# Patient Record
Sex: Male | Born: 1955 | Race: White | Hispanic: No | Marital: Married | Smoking: Never smoker
Health system: Southern US, Community
[De-identification: ages and names within clinical notes are randomized; demographics above are authoritative.]

## PROBLEM LIST (undated history)

## (undated) DIAGNOSIS — E785 Hyperlipidemia, unspecified: Secondary | ICD-10-CM

## (undated) DIAGNOSIS — R066 Hiccough: Secondary | ICD-10-CM

## (undated) DIAGNOSIS — M1990 Unspecified osteoarthritis, unspecified site: Secondary | ICD-10-CM

## (undated) DIAGNOSIS — F32A Depression, unspecified: Secondary | ICD-10-CM

## (undated) DIAGNOSIS — G4730 Sleep apnea, unspecified: Secondary | ICD-10-CM

## (undated) DIAGNOSIS — J189 Pneumonia, unspecified organism: Secondary | ICD-10-CM

## (undated) DIAGNOSIS — I10 Essential (primary) hypertension: Secondary | ICD-10-CM

## (undated) DIAGNOSIS — F329 Major depressive disorder, single episode, unspecified: Secondary | ICD-10-CM

## (undated) HISTORY — DX: Essential (primary) hypertension: I10

## (undated) HISTORY — DX: Pneumonia, unspecified organism: J18.9

## (undated) HISTORY — DX: Hyperlipidemia, unspecified: E78.5

## (undated) HISTORY — PX: CATARACT EXTRACTION: SUR2

## (undated) HISTORY — DX: Depression, unspecified: F32.A

## (undated) HISTORY — DX: Major depressive disorder, single episode, unspecified: F32.9

## (undated) HISTORY — DX: Hiccough: R06.6

## (undated) HISTORY — PX: OTHER SURGICAL HISTORY: SHX169

## (undated) HISTORY — PX: COLONOSCOPY: SHX174

## (undated) HISTORY — DX: Unspecified osteoarthritis, unspecified site: M19.90

## (undated) HISTORY — DX: Sleep apnea, unspecified: G47.30

## (undated) HISTORY — PX: MELANOMA EXCISION: SHX5266

---

## 2001-08-16 ENCOUNTER — Encounter: Payer: Self-pay | Admitting: Pulmonary Disease

## 2001-09-18 ENCOUNTER — Encounter: Payer: Self-pay | Admitting: Pulmonary Disease

## 2005-08-19 ENCOUNTER — Ambulatory Visit: Payer: Self-pay | Admitting: Family Medicine

## 2005-08-30 ENCOUNTER — Ambulatory Visit: Payer: Self-pay | Admitting: Family Medicine

## 2005-10-28 ENCOUNTER — Ambulatory Visit: Payer: Self-pay | Admitting: Family Medicine

## 2006-08-28 ENCOUNTER — Ambulatory Visit: Payer: Self-pay | Admitting: Family Medicine

## 2006-09-11 ENCOUNTER — Encounter: Admission: RE | Admit: 2006-09-11 | Discharge: 2006-09-11 | Payer: Self-pay | Admitting: Surgery

## 2006-10-13 ENCOUNTER — Ambulatory Visit: Payer: Self-pay | Admitting: Family Medicine

## 2006-10-13 LAB — CONVERTED CEMR LAB
Albumin: 4 g/dL (ref 3.5–5.2)
Alkaline Phosphatase: 77 units/L (ref 39–117)
Basophils Relative: 0.7 % (ref 0.0–1.0)
Eosinophils Relative: 3.1 % (ref 0.0–5.0)
Glucose, Bld: 85 mg/dL (ref 70–99)
HCT: 47.8 % (ref 39.0–52.0)
LDL Cholesterol: 105 mg/dL — ABNORMAL HIGH (ref 0–99)
MCHC: 35 g/dL (ref 30.0–36.0)
MCV: 91.4 fL (ref 78.0–100.0)
Monocytes Relative: 8.3 % (ref 3.0–11.0)
Platelets: 253 10*3/uL (ref 150–400)
Potassium: 4 meq/L (ref 3.5–5.1)
Total Bilirubin: 0.9 mg/dL (ref 0.3–1.2)
Total CHOL/HDL Ratio: 3.6
Triglycerides: 93 mg/dL (ref 0–149)
WBC: 7.7 10*3/uL (ref 4.5–10.5)

## 2006-10-20 ENCOUNTER — Ambulatory Visit: Payer: Self-pay | Admitting: Family Medicine

## 2006-11-07 ENCOUNTER — Ambulatory Visit: Payer: Self-pay | Admitting: Licensed Clinical Social Worker

## 2006-11-14 ENCOUNTER — Ambulatory Visit: Payer: Self-pay | Admitting: Licensed Clinical Social Worker

## 2006-11-22 ENCOUNTER — Ambulatory Visit: Payer: Self-pay | Admitting: Licensed Clinical Social Worker

## 2006-11-28 ENCOUNTER — Ambulatory Visit: Payer: Self-pay | Admitting: Licensed Clinical Social Worker

## 2007-02-12 ENCOUNTER — Emergency Department (HOSPITAL_COMMUNITY): Admission: EM | Admit: 2007-02-12 | Discharge: 2007-02-12 | Payer: Self-pay | Admitting: Emergency Medicine

## 2007-05-11 DIAGNOSIS — G4733 Obstructive sleep apnea (adult) (pediatric): Secondary | ICD-10-CM

## 2007-05-11 DIAGNOSIS — E785 Hyperlipidemia, unspecified: Secondary | ICD-10-CM

## 2007-05-29 ENCOUNTER — Ambulatory Visit: Payer: Self-pay | Admitting: Family Medicine

## 2007-05-29 DIAGNOSIS — F329 Major depressive disorder, single episode, unspecified: Secondary | ICD-10-CM

## 2007-07-09 ENCOUNTER — Ambulatory Visit: Payer: Self-pay | Admitting: Pulmonary Disease

## 2007-08-07 ENCOUNTER — Encounter: Payer: Self-pay | Admitting: Pulmonary Disease

## 2007-08-17 ENCOUNTER — Ambulatory Visit: Payer: Self-pay | Admitting: Pulmonary Disease

## 2007-08-21 ENCOUNTER — Encounter: Payer: Self-pay | Admitting: Pulmonary Disease

## 2007-09-08 IMAGING — CT CT PELVIS W/ CM
1 of 5 series · 14 of 36 positions shown, 19 images · IV contrast (READICAT/WATER & [ID] OMNI 300)
Comparison: None.

ABDOMEN CT WITH CONTRAST:

CLINICAL DATA: Left abdominal pain. Lymphadenopathy.
TECHNIQUE: Multidetector CT imaging of the abdomen and pelvis was performed
following the standard protocol during bolus administration of intravenous
contrast.

Contrast:  125 cc Omnipaque 300

[Series 3: routine abdomen · axial · 0.70mm/px · z∈[-457,-72]mm · 14 of 87 slices shown, 19 images]
[im 5/87  soft-tissue]
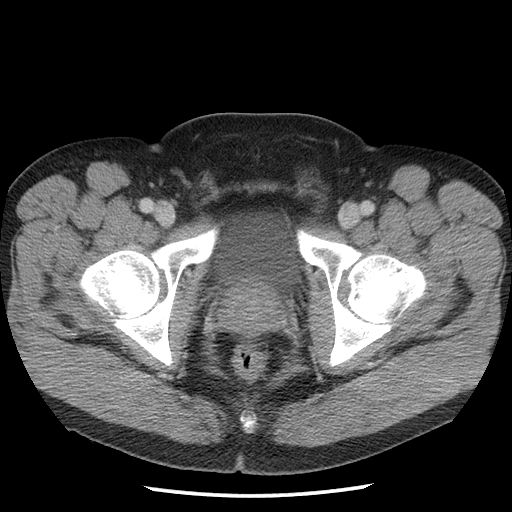
[im 5/87  bone]
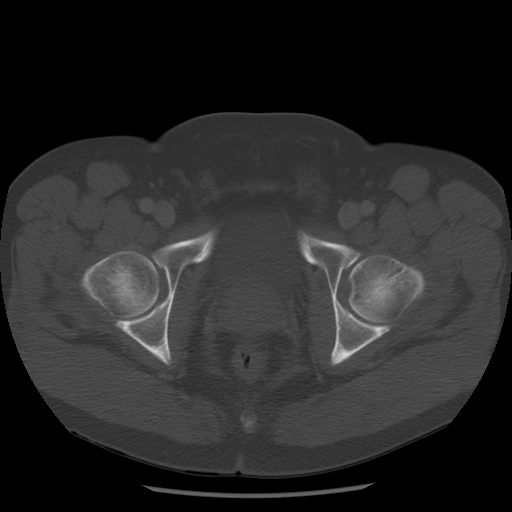
[im 10/87  soft-tissue]
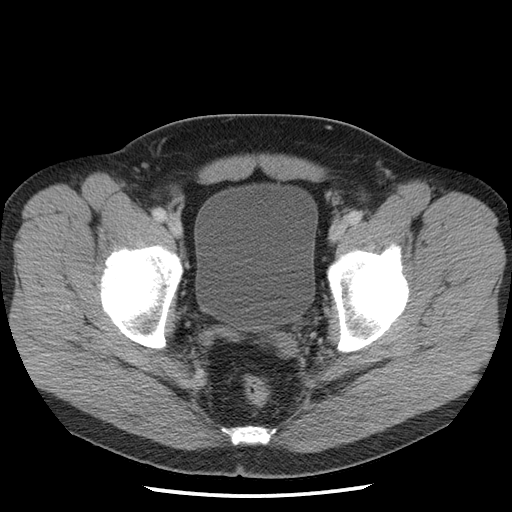
[im 20/87  soft-tissue]
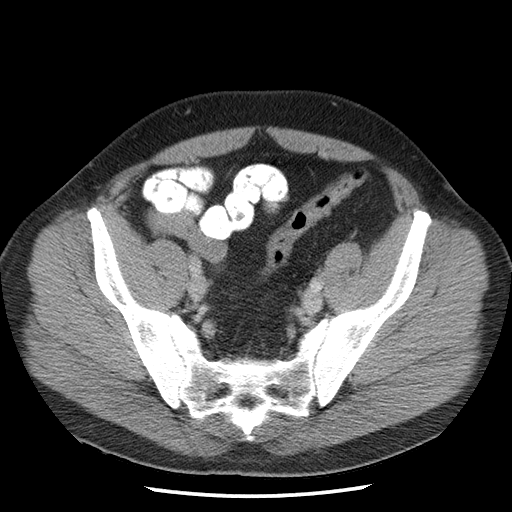
[im 24/87  soft-tissue]
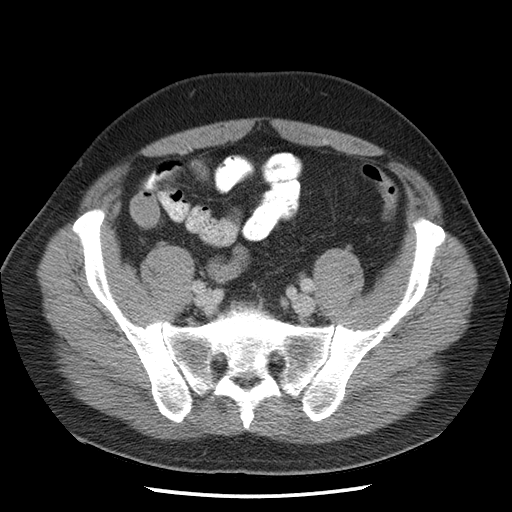
[im 29/87  soft-tissue]
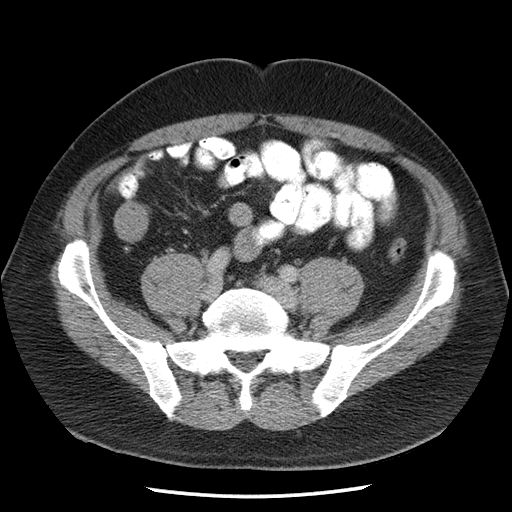
[im 39/87  soft-tissue]
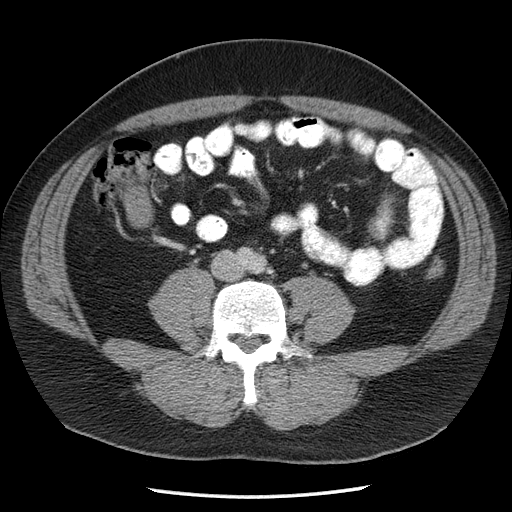
[im 44/87  soft-tissue]
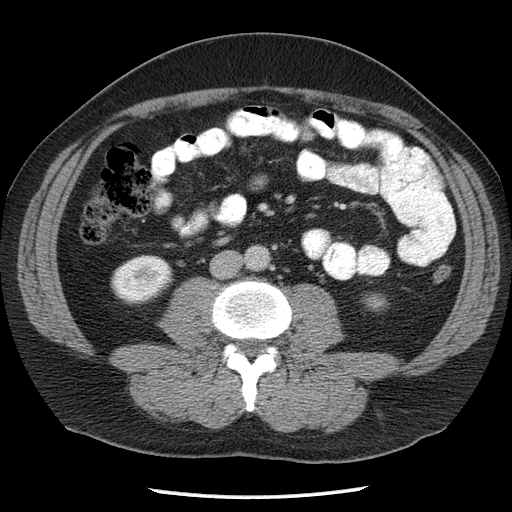
[im 48/87  soft-tissue]
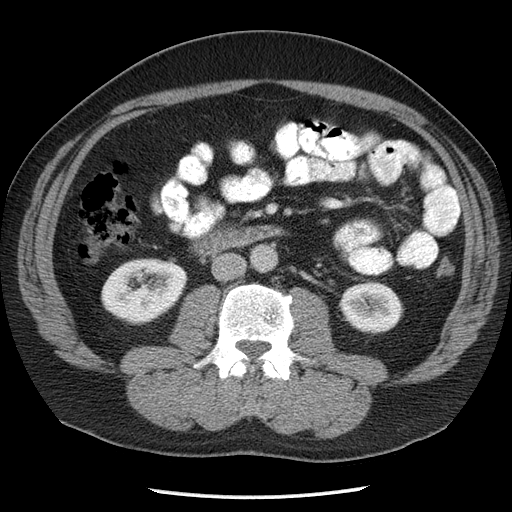
[im 58/87  soft-tissue]
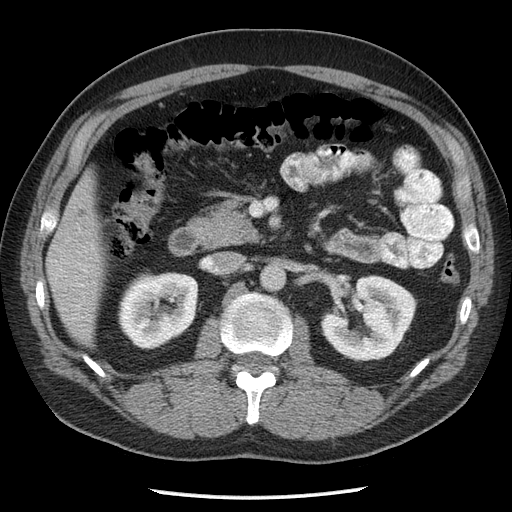
[im 58/87  bone]
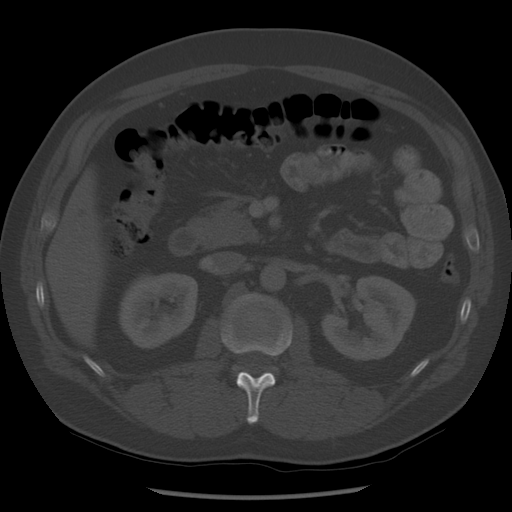
[im 63/87  soft-tissue]
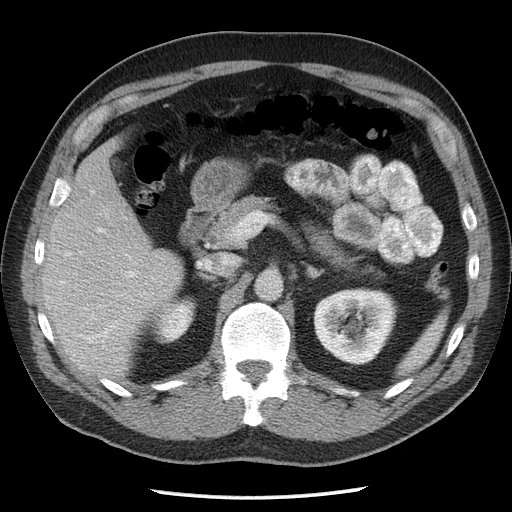
[im 67/87  soft-tissue]
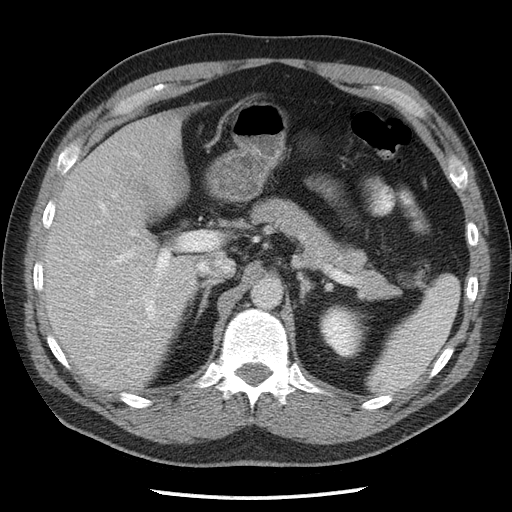
[im 67/87  lung]
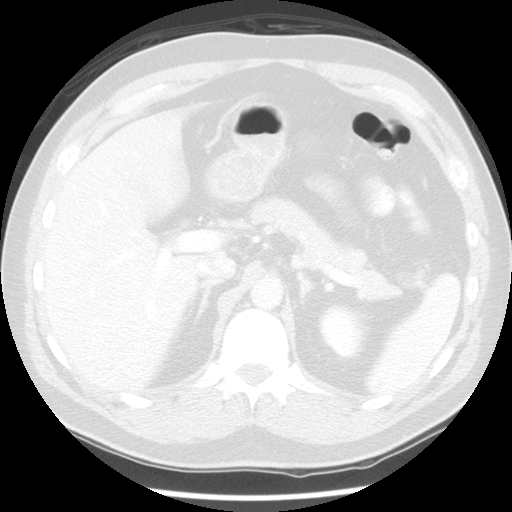
[im 72/87  lung]
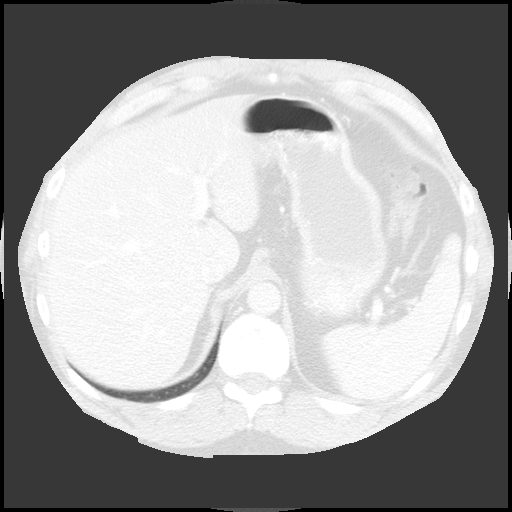
[im 77/87  soft-tissue]
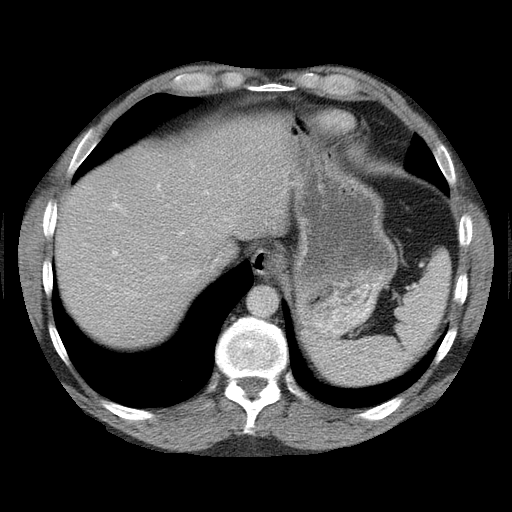
[im 77/87  lung]
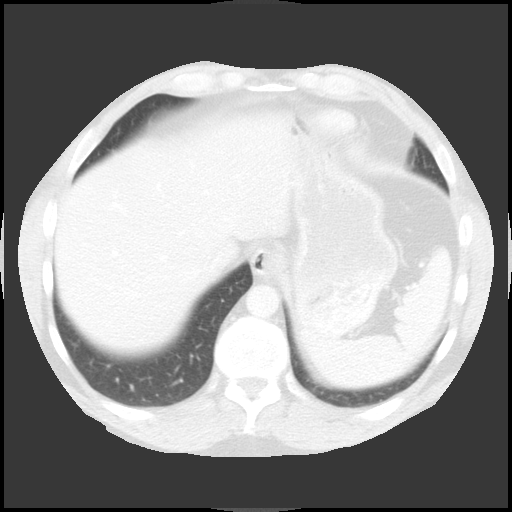
[im 82/87  soft-tissue]
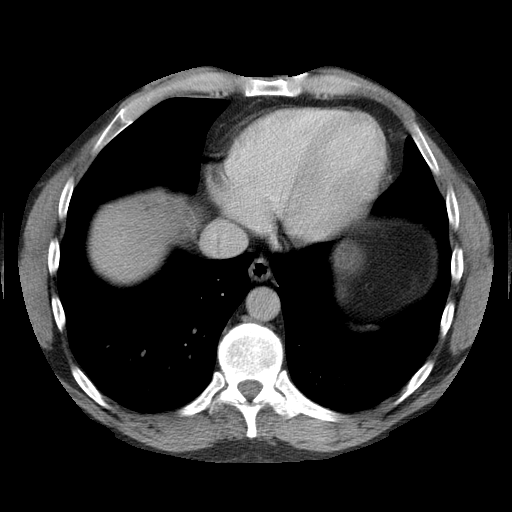
[im 82/87  lung]
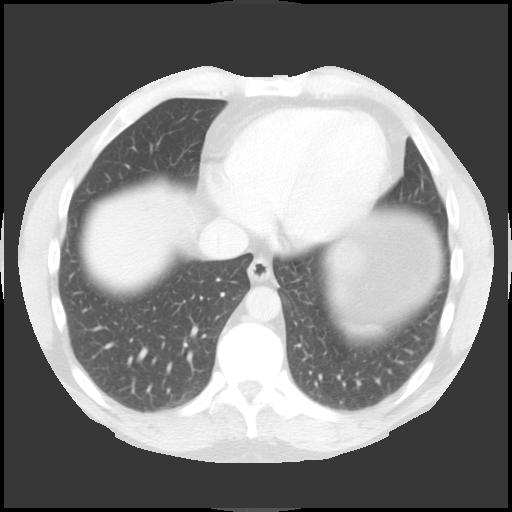

[14 of 36 positions shown; findings below may reference images not displayed]

FINDINGS: A tiny low density lesion in the inferior right liver is too small to
characterize but statistically represents a benign lesion, probably a cyst. The
spleen, stomach, duodenum, pancreas, adrenal glands, and kidneys are
unremarkable. Gallbladder is not distended. There is no retroperitoneal,
hepatoduodenal ligament, or mesenteric lymphadenopathy. No abdominal aortic
aneurysm.
IMPRESSION: Unremarkable CT examination of the abdomen.

PELVIS CT WITH CONTRAST:
FINDINGS: No lymphadenopathy in the anatomic pelvis. No intraperitoneal free
fluid. Bladder is mildly distended. Terminal ileum as normal features. There is
a tiny appendicolith in the appendix which is otherwise normal in appearance.
IMPRESSION: Unremarkable exam of the pelvis. Specifically, there is no evidence for
lymphadenopathy.

## 2007-12-06 ENCOUNTER — Ambulatory Visit: Payer: Self-pay | Admitting: Family Medicine

## 2007-12-06 DIAGNOSIS — M6580 Other synovitis and tenosynovitis, unspecified site: Secondary | ICD-10-CM

## 2007-12-06 LAB — CONVERTED CEMR LAB
ALT: 35 units/L (ref 0–53)
AST: 32 units/L (ref 0–37)
Alkaline Phosphatase: 74 units/L (ref 39–117)
BUN: 17 mg/dL (ref 6–23)
Basophils Absolute: 0 10*3/uL (ref 0.0–0.1)
Basophils Relative: 0.1 % (ref 0.0–1.0)
Bilirubin Urine: NEGATIVE
Blood in Urine, dipstick: NEGATIVE
CO2: 32 meq/L (ref 19–32)
Calcium: 9.5 mg/dL (ref 8.4–10.5)
Eosinophils Absolute: 0.2 10*3/uL (ref 0.0–0.7)
Eosinophils Relative: 1.9 % (ref 0.0–5.0)
Glucose, Bld: 95 mg/dL (ref 70–99)
HCT: 47.4 % (ref 39.0–52.0)
HDL: 42.2 mg/dL (ref >39.0)
MCV: 93.3 fL (ref 78.0–100.0)
Monocytes Relative: 7.2 % (ref 3.0–12.0)
Neutrophils Relative %: 70.3 % (ref 43.0–77.0)
Nitrite: NEGATIVE
PSA: 1.49 ng/mL (ref 0.10–4.00)
Protein, U semiquant: NEGATIVE
RDW: 12.3 % (ref 11.5–14.6)
Specific Gravity, Urine: 1.02
Urobilinogen, UA: 0.2
VLDL: 42 mg/dL — ABNORMAL HIGH (ref 0–40)
WBC Urine, dipstick: NEGATIVE
WBC: 11.4 10*3/uL — ABNORMAL HIGH (ref 4.5–10.5)

## 2008-01-14 ENCOUNTER — Ambulatory Visit: Payer: Self-pay | Admitting: Family Medicine

## 2009-01-05 ENCOUNTER — Ambulatory Visit: Payer: Self-pay | Admitting: Family Medicine

## 2009-01-05 LAB — CONVERTED CEMR LAB
ALT: 37 units/L (ref 0–53)
Albumin: 4 g/dL (ref 3.5–5.2)
BUN: 13 mg/dL (ref 6–23)
Basophils Relative: 0.1 % (ref 0.0–3.0)
Bilirubin, Direct: 0 mg/dL (ref 0.0–0.3)
Blood in Urine, dipstick: NEGATIVE
Chloride: 103 meq/L (ref 96–112)
Eosinophils Absolute: 0.2 10*3/uL (ref 0.0–0.7)
GFR calc non Af Amer: 94.63 mL/min (ref >60)
HCT: 45.7 % (ref 39.0–52.0)
Hemoglobin: 16 g/dL (ref 13.0–17.0)
Ketones, urine, test strip: NEGATIVE
LDL Cholesterol: 85 mg/dL (ref 0–99)
Lymphocytes Relative: 27 % (ref 12.0–46.0)
Lymphs Abs: 2.5 10*3/uL (ref 0.7–4.0)
MCHC: 35.1 g/dL (ref 30.0–36.0)
MCV: 95.1 fL (ref 78.0–100.0)
Monocytes Relative: 9.1 % (ref 3.0–12.0)
Neutro Abs: 5.6 10*3/uL (ref 1.4–7.7)
Neutrophils Relative %: 61.4 % (ref 43.0–77.0)
Nitrite: NEGATIVE
RBC: 4.8 M/uL (ref 4.22–5.81)
Specific Gravity, Urine: 1.02
TSH: 1.09 microintl units/mL (ref 0.35–5.50)
Total CHOL/HDL Ratio: 4
VLDL: 35.2 mg/dL (ref 0.0–40.0)

## 2009-01-14 ENCOUNTER — Encounter: Payer: Self-pay | Admitting: Family Medicine

## 2009-01-15 ENCOUNTER — Ambulatory Visit: Payer: Self-pay | Admitting: Family Medicine

## 2009-01-15 DIAGNOSIS — R269 Unspecified abnormalities of gait and mobility: Secondary | ICD-10-CM

## 2009-01-26 ENCOUNTER — Ambulatory Visit: Payer: Self-pay | Admitting: Gastroenterology

## 2009-02-09 ENCOUNTER — Ambulatory Visit: Payer: Self-pay | Admitting: Gastroenterology

## 2009-02-09 LAB — HM COLONOSCOPY

## 2009-03-09 ENCOUNTER — Telehealth: Payer: Self-pay | Admitting: Family Medicine

## 2009-11-29 ENCOUNTER — Encounter: Payer: Self-pay | Admitting: Pulmonary Disease

## 2009-11-30 ENCOUNTER — Ambulatory Visit: Payer: Self-pay | Admitting: Pulmonary Disease

## 2010-03-25 ENCOUNTER — Ambulatory Visit: Payer: Self-pay | Admitting: Family Medicine

## 2010-03-25 LAB — CONVERTED CEMR LAB
ALT: 44 units/L (ref 0–53)
AST: 31 units/L (ref 0–37)
Albumin: 4.2 g/dL (ref 3.5–5.2)
Bilirubin Urine: NEGATIVE
Bilirubin, Direct: 0.1 mg/dL (ref 0.0–0.3)
Blood in Urine, dipstick: NEGATIVE
Calcium: 9.4 mg/dL (ref 8.4–10.5)
Chloride: 103 meq/L (ref 96–112)
Cholesterol: 208 mg/dL — ABNORMAL HIGH (ref 0–200)
Direct LDL: 138.4 mg/dL
GFR calc non Af Amer: 84.37 mL/min (ref >60)
Glucose, Bld: 81 mg/dL (ref 70–99)
HDL: 49.4 mg/dL (ref >39.00)
Lymphocytes Relative: 28.8 % (ref 12.0–46.0)
MCHC: 35.5 g/dL (ref 30.0–36.0)
Monocytes Relative: 7.2 % (ref 3.0–12.0)
Neutro Abs: 4.8 10*3/uL (ref 1.4–7.7)
Nitrite: NEGATIVE
PSA: 1.39 ng/mL (ref 0.10–4.00)
Platelets: 236 10*3/uL (ref 150.0–400.0)
RBC: 4.81 M/uL (ref 4.22–5.81)
Total Bilirubin: 0.6 mg/dL (ref 0.3–1.2)
Total Protein: 6.6 g/dL (ref 6.0–8.3)
Urobilinogen, UA: 0.2
WBC: 7.9 10*3/uL (ref 4.5–10.5)

## 2010-04-09 ENCOUNTER — Ambulatory Visit: Payer: Self-pay | Admitting: Family Medicine

## 2010-04-20 ENCOUNTER — Ambulatory Visit: Payer: Self-pay | Admitting: Family Medicine

## 2010-04-23 ENCOUNTER — Ambulatory Visit: Payer: Self-pay | Admitting: Family Medicine

## 2010-04-23 DIAGNOSIS — M549 Dorsalgia, unspecified: Secondary | ICD-10-CM | POA: Insufficient documentation

## 2010-04-29 ENCOUNTER — Ambulatory Visit: Payer: Self-pay | Admitting: Family Medicine

## 2010-05-10 ENCOUNTER — Telehealth: Payer: Self-pay | Admitting: Family Medicine

## 2010-05-10 DIAGNOSIS — Z85828 Personal history of other malignant neoplasm of skin: Secondary | ICD-10-CM

## 2010-09-14 NOTE — Assessment & Plan Note (Signed)
Summary: lesion removal/njr pt rsc/njr   Allergies: No Known Drug Allergies   Complete Medication List: 1)  Simvastatin 40 Mg Tabs (Simvastatin) .... Take 1 tablet by mouth at bedtime 2)  Prozac 10 Mg Caps (Fluoxetine hcl) .... Once daily 3)  Lamictal 200 Mg Tabs (Lamotrigine) .... Q hs  Other Orders: No Charge Patient Arrived (NCPA0) (NCPA0)

## 2010-09-14 NOTE — Assessment & Plan Note (Signed)
Summary: back pain/njr   Vital Signs:  Patient profile:   55 year old male Weight:      232 pounds Temp:     98.1 degrees F oral BP sitting:   130 / 82  (right arm) Cuff size:   regular CC: back pain, src Is Patient Diabetic? No   Primary Care Vincent Wilkerson:  Vincent Wilkerson  CC:  back pain and src.  History of Present Illness: Vincent Wilkerson is a 55 year old, married male, nonsmoker, who comes in today for evaluation of low back pain.  Three months ago.  He was using a chain saw was doing a lot of bending and developed severe left lumbar back pain.  At that time.  He treated symptomatically at home, however, it took 3 weeks for the pain go away.  Since that, time.  He's been having intermittent spells low back strain.  This morning around 11 he went to get out of the car twisted it again, developed the sudden onset of severe low back pain.  He describes the pain as sharp, constant, a 7 on a scale of one to 10, and it does not radiate down his legs.  Review of systems negative.  Neurologic review of systems also negative  Preventive Screening-Counseling & Management  Alcohol-Tobacco     Smoking Status: never  Current Medications (verified): 1)  Simvastatin 40 Mg Tabs (Simvastatin) .... Take 1 Tablet By Mouth At Bedtime 2)  Prozac 40 Mg  Caps (Fluoxetine Hcl) .... Once Daily 3)  Lamictal 200 Mg  Tabs (Lamotrigine) .... Q Hs  Allergies (verified): No Known Drug Allergies  Past History:  Past medical, surgical, family and social histories (including risk factors) reviewed, and no changes noted (except as noted below).  Past Medical History: Reviewed history from 08/17/2007 and no changes required. Current Problems:  DEPRESSION (ICD-311) SLEEP APNEA (ICD-780.57) HYPERLIPIDEMIA (ICD-272.4)  Past Surgical History: Reviewed history from 05/11/2007 and no changes required. pneumonia  1962  Family History: Reviewed history from 12/06/2007 and no changes required. father mid-70s has  congestive heart failure, polycythemia, and gallbladder disease mother has a history of smoking, alcoholism, and depression  one brother in good healthone sister died at 42 from alcoholism  Social History: Reviewed history from 12/06/2007 and no changes required. Occupation: Married Never Smoked Alcohol use-no Drug use-no Regular exercise-yes  Review of Systems      See HPI  Physical Exam  General:  Well-developed,well-nourished,in no acute distress; alert,appropriate and cooperative throughout examination Msk:  normal except extremely tight hamstrings.  Patient cannot lift leg beyond 45 degrees Pulses:  R and L carotid,radial,femoral,dorsalis pedis and posterior tibial pulses are full and equal bilaterally Extremities:  No clubbing, cyanosis, edema, or deformity noted with normal full range of motion of all joints.   Neurologic:  No cranial nerve deficits noted. Station and gait are normal. Plantar reflexes are down-going bilaterally. DTRs are symmetrical throughout. Sensory, motor and coordinative functions appear intact.   Problems:  Medical Problems Added: 1)  Dx of Back Pain  (ICD-724.5)  Impression & Recommendations:  Problem # 1:  BACK PAIN (ICD-724.5) Assessment New  His updated medication list for this problem includes:    Vicodin Es 7.5-750 Mg Tabs (Hydrocodone-acetaminophen) .Marland Kitchen... Take 1 tablet by mouth three times a day    Flexeril 10 Mg Tabs (Cyclobenzaprine hcl) .Marland Kitchen... Take 1 tablet by mouth three times a day  Orders: Physical Therapy Referral (PT)  Complete Medication List: 1)  Simvastatin 40 Mg Tabs (Simvastatin) .... Take 1 tablet  by mouth at bedtime 2)  Prozac 40 Mg Caps (fluoxetine Hcl)  .... Once daily 3)  Lamictal 200 Mg Tabs (Lamotrigine) .... Q hs 4)  Vicodin Es 7.5-750 Mg Tabs (Hydrocodone-acetaminophen) .... Take 1 tablet by mouth three times a day 5)  Flexeril 10 Mg Tabs (Cyclobenzaprine hcl) .... Take 1 tablet by mouth three times a  day  Patient Instructions: 1)  stay at bed rest at home today, Saturday, and Sunday.........Marland Kitchen on Sunday begin to walk.......... lie down..........I will put in a request to get started on physical therapy on Monday. 2)  Take a half of Flexeril and Vicodin 3 or 4 times a day. 3)  Motrin 600 mg 3 times a day with food Prescriptions: FLEXERIL 10 MG TABS (CYCLOBENZAPRINE HCL) Take 1 tablet by mouth three times a day  #30 x 1   Entered and Authorized by:   Roderick Pee MD   Signed by:   Roderick Pee MD on 04/23/2010   Method used:   Print then Give to Patient   RxID:   [id number] VICODIN ES 7.5-750 MG TABS (HYDROCODONE-ACETAMINOPHEN) Take 1 tablet by mouth three times a day  #30 x 1   Entered and Authorized by:   Roderick Pee MD   Signed by:   Roderick Pee MD on 04/23/2010   Method used:   Print then Give to Patient   RxID:   [id number]

## 2010-09-14 NOTE — Assessment & Plan Note (Signed)
Summary: cpx/cjr   Vital Signs:  Patient profile:   55 year old male Height:      69.5 inches Weight:      231 pounds Temp:     98.5 degrees F oral BP sitting:   140 / 90  (left arm) Cuff size:   regular  Vitals Entered By: Kern Reap CMA Duncan Dull) (April 09, 2010 2:37 PM) CC: cpx Is Patient Diabetic? No Pain Assessment Patient in pain? no        Primary Care Domonique Brouillard:  todd  CC:  cpx.  History of Present Illness: Vincent Wilkerson is a 55 year old male, married, nonsmoker, who comes in today for general physical examination  He has a history of underlying hyperlipidemia, for which he takes simvastatin 40 mg nightly and one baby aspirin.  He has a history of depression, for which he takes Lamictal 200 mg daily and Prozac 10 mg daily.  He gets routine eye care, dental care, colonoscopy last year.  Normal, tetanus booster 2007.  Allergies: No Known Drug Allergies  Past History:  Past medical, surgical, family and social histories (including risk factors) reviewed, and no changes noted (except as noted below).  Past Medical History: Reviewed history from 08/17/2007 and no changes required. Current Problems:  DEPRESSION (ICD-311) SLEEP APNEA (ICD-780.57) HYPERLIPIDEMIA (ICD-272.4)  Past Surgical History: Reviewed history from 05/11/2007 and no changes required. pneumonia  1962  Family History: Reviewed history from 12/06/2007 and no changes required. father mid-70s has congestive heart failure, polycythemia, and gallbladder disease mother has a history of smoking, alcoholism, and depression  one brother in good healthone sister died at 19 from alcoholism  Social History: Reviewed history from 12/06/2007 and no changes required. Occupation: Married Never Smoked Alcohol use-no Drug use-no Regular exercise-yes  Review of Systems      See HPI  Physical Exam  General:  Well-developed,well-nourished,in no acute distress; alert,appropriate and cooperative  throughout examination Head:  Normocephalic and atraumatic without obvious abnormalities. No apparent alopecia or balding. Eyes:  No corneal or conjunctival inflammation noted. EOMI. Perrla. Funduscopic exam benign, without hemorrhages, exudates or papilledema. Vision grossly normal. Ears:  External ear exam shows no significant lesions or deformities.  Otoscopic examination reveals clear canals, tympanic membranes are intact bilaterally without bulging, retraction, inflammation or discharge. Hearing is grossly normal bilaterally. Nose:  External nasal examination shows no deformity or inflammation. Nasal mucosa are pink and moist without lesions or exudates. Mouth:  Oral mucosa and oropharynx without lesions or exudates.  Teeth in good repair. Neck:  No deformities, masses, or tenderness noted. Chest Wall:  No deformities, masses, tenderness or gynecomastia noted. Breasts:  No masses or gynecomastia noted Lungs:  Normal respiratory effort, chest expands symmetrically. Lungs are clear to auscultation, no crackles or wheezes. Heart:  Normal rate and regular rhythm. S1 and S2 normal without gallop, murmur, click, rub or other extra sounds. Abdomen:  Bowel sounds positive,abdomen soft and non-tender without masses, organomegaly or hernias noted. Rectal:  No external abnormalities noted. Normal sphincter tone. No rectal masses or tenderness. Genitalia:  Testes bilaterally descended without nodularity, tenderness or masses. No scrotal masses or lesions. No penis lesions or urethral discharge. Prostate:  Prostate gland firm and smooth, no enlargement, nodularity, tenderness, mass, asymmetry or induration. Msk:  No deformity or scoliosis noted of thoracic or lumbar spine.   Pulses:  R and L carotid,radial,femoral,dorsalis pedis and posterior tibial pulses are full and equal bilaterally Extremities:  No clubbing, cyanosis, edema, or deformity noted with normal full range of  motion of all joints.     Neurologic:  No cranial nerve deficits noted. Station and gait are normal. Plantar reflexes are down-going bilaterally. DTRs are symmetrical throughout. Sensory, motor and coordinative functions appear intact. Skin:  Intact without suspicious lesions or rashes Cervical Nodes:  No lymphadenopathy noted Axillary Nodes:  No palpable lymphadenopathy Inguinal Nodes:  No significant adenopathy Psych:  Cognition and judgment appear intact. Alert and cooperative with normal attention span and concentration. No apparent delusions, illusions, hallucinations   Impression & Recommendations:  Problem # 1:  TENDINITIS, RIGHT ELBOW (ICD-727.09) Assessment Improved  Problem # 2:  HYPERLIPIDEMIA (ICD-272.4) Assessment: Improved  His updated medication list for this problem includes:    Simvastatin 40 Mg Tabs (Simvastatin) .Marland Kitchen... Take 1 tablet by mouth at bedtime  Orders: EKG w/ Interpretation (93000)  Problem # 3:  Preventive Health Care (ICD-V70.0) Assessment: Comment Only  Complete Medication List: 1)  Simvastatin 40 Mg Tabs (Simvastatin) .... Take 1 tablet by mouth at bedtime 2)  Prozac 10 Mg Caps (Fluoxetine hcl) .... Once daily 3)  Lamictal 200 Mg Tabs (Lamotrigine) .... Q hs  Other Orders: Admin 1st Vaccine (91478) Flu Vaccine 89yrs + (29562)  Patient Instructions: 1)  Please schedule a follow-up appointment in 1 year. Prescriptions: LAMICTAL 200 MG  TABS (LAMOTRIGINE) q hs  #100 x 3   Entered and Authorized by:   Roderick Pee MD   Signed by:   Roderick Pee MD on 04/09/2010   Method used:   Electronically to        [facility] [address] (retail)       [address]       [place], [place]  [address]       Ph: [phone] or [phone]       Fax: [phone]   RxID:   [id number] PROZAC 10 MG  CAPS (FLUOXETINE HCL) once daily  #100 x 3   Entered and Authorized by:   Roderick Pee MD   Signed by:   Roderick Pee MD on  04/09/2010   Method used:   Electronically to        [facility] [address] (retail)       [address]       [place], [place]  [address]       Ph: [phone] or [phone]       Fax: [phone]   RxID:   [id number] SIMVASTATIN 40 MG TABS (SIMVASTATIN) Take 1 tablet by mouth at bedtime  #100 x 3   Entered and Authorized by:   Roderick Pee MD   Signed by:   Roderick Pee MD on 04/09/2010   Method used:   Electronically to        [facility] [address] (retail)       [address]       [place], [place]  [address]       Ph: [phone] or [phone]       Fax: [phone]   RxID:   [id number]  Flu Vaccine Consent Questions     Do you have a history of severe allergic reactions to this vaccine? no    Any prior history of allergic reactions to egg and/or gelatin? no    Do you have a sensitivity to the preservative  Thimersol? no    Do you have a past history of Guillan-Barre Syndrome? no    Do you currently have an acute febrile illness? no    Have you ever had a severe reaction to latex? no    Vaccine information given and explained to patient? yes    Are you currently pregnant? no    Lot Number:[id number]   Exp Date:02/12/2011   Site Given  Left Deltoid IM       Ph: [phone] or [phone]       Fax: [phone]   RxID:   [id number]   .lbflu

## 2010-09-14 NOTE — Letter (Signed)
Summary: Pharmacologist at [facility]  [address] [place], [place] [address]   Phone: [phone]  Fax: [phone]    I hereby consent to, and authorize:   to perform the following procedure:   on patient: Vincent  Wilkerson   utilizing the following anesthetic or anesthesia: local  administered by the following individual:   My physician has explained the following to me in language that I understand: The nature of the treatment/procedure, the risks of the procedure, the possible complications of the procedure, the expected benefits or effects of the treatment/procedure, and any alternatives to the procedure and their risks and benefits.  I understand that no guarantees have been made to me concerning the results of treatment, surgery, or other procedures.  If unforeseen conditions require additional procedures, and it is not reasonably practical to obtain my consent, I authorize my physician to proceed as he/she considers advisable and in my best interest, unless otherwise specified as follows.  Exceptions, if any:   I have read the previous information, and I understand it.  Any questions which may have occurred to me have been answered to my satisfaction.  ______________________________________________ Patient Signature/Date/Time  ______________________________________________ Parent, Guardian, or Legal Representative/Date (State your relationship to the patient)  ______________________________________________ Witness Signature/Date/Time

## 2010-09-14 NOTE — Assessment & Plan Note (Signed)
Summary: lesion removal/no charge/njr   Allergies: No Known Drug Allergies   Complete Medication List: 1)  Simvastatin 40 Mg Tabs (Simvastatin) .... Take 1 tablet by mouth at bedtime 2)  Prozac 40 Mg Caps (fluoxetine Hcl)  .... Once daily 3)  Lamictal 200 Mg Tabs (Lamotrigine) .... Q hs 4)  Vicodin Es 7.5-750 Mg Tabs (Hydrocodone-acetaminophen) .... Take 1 tablet by mouth three times a day 5)  Flexeril 10 Mg Tabs (Cyclobenzaprine hcl) .... Take 1 tablet by mouth three times a day  Other Orders: Shave Skin Lesion 0.6-1.0 cm/trunk/arm/leg (11301)  Appended Document: lesion removal/no charge/njr     Allergies: No Known Drug Allergies   Complete Medication List: 1)  Simvastatin 40 Mg Tabs (Simvastatin) .... Take 1 tablet by mouth at bedtime 2)  Prozac 40 Mg Caps (fluoxetine Hcl)  .... Once daily 3)  Lamictal 200 Mg Tabs (Lamotrigine) .... Q hs 4)  Vicodin Es 7.5-750 Mg Tabs (Hydrocodone-acetaminophen) .... Take 1 tablet by mouth three times a day 5)  Flexeril 10 Mg Tabs (Cyclobenzaprine hcl) .... Take 1 tablet by mouth three times a day  Other Orders: Excise Malig lesion (TAL) 0.6 - 1.0 cm (11601)

## 2010-09-14 NOTE — Progress Notes (Signed)
  Phone Note Outgoing Call   Summary of Call: I called Vincent Wilkerson to explain to him the path report.  Basal cell carcinoma.  Follow-up in 6 months, sooner if any problems Initial call taken by: Roderick Pee MD,  May 10, 2010 9:03 AM

## 2010-09-14 NOTE — Assessment & Plan Note (Signed)
Summary: trouble sleeping/apc   Primary Provider/Referring Provider:  todd  CC:  Follow up.  Pt last seen by RA 08/2007.  Pt states he is wearing cpap from about 9pm til 4am everynight.  States he then will wake up around 4 am and cannot go back to sleep.  Pt c/o increased daytime sleepiness x 5months.  states no problems with mask.  Does state humidifier has broken on cpap so pressure is dry.  .  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  His weight then was 220 pounds.  During the diagnostic phase, the respiratorydisturbance index (RDI) was 21 events per hour with an AHI of 14.9events per hour.  There were 33 obstructive apneas and 50 hypopneas. The lowest oxygen desaturation was 85%.  For supine sleep the AHI was 53.4 events per hour.  During a subsequent study, nasal CPAP wasinitiated at 4 cm and titrated to 9 cm.  At this final pressure a total of 3.5 hours of sleep was recorded, of which point 6 hours was REMsleep.  He has been maintained on respironics 9 cm , medium mirage mask since  November 30, 2009 4:48 PM  Last FU 1/09 - has not changed mask in 9 mnths. Bedtime 9-10 pm, minimal latency, wakes up around MN for BR visit, wakes up refreshed at 4 am for 'chores'. c/o daytime fatigue, stress over teenage step -daughter, pressure ok, no dryness, reports compliance c/o increased daytime sleepiness x 5months.  states no problems with mask.  Does state humidifier has broken on cpap so pressure is dry.  . download 8/25- 11/29/09 >>, no residual events on 9 cm, excellent compliance  Current Medications (verified): 1)  Simvastatin 40 Mg Tabs (Simvastatin) .... Take 1 Tablet By Mouth At Bedtime 2)  Prozac 10 Mg  Caps (Fluoxetine Hcl) .... Once Daily 3)  Lamictal 200 Mg  Tabs (Lamotrigine) .... Q Hs  Allergies (verified): No Known Drug Allergies  Past History:  Past  Medical History: Last updated: 08/17/2007 Current Problems:  DEPRESSION (ICD-311) SLEEP APNEA (ICD-780.57) HYPERLIPIDEMIA (ICD-272.4)  Social History: Last updated: 12/06/2007 Occupation: Married Never Smoked Alcohol use-no Drug use-no Regular exercise-yes  Review of Systems  The patient denies anorexia, fever, weight loss, weight gain, vision loss, decreased hearing, hoarseness, chest pain, syncope, dyspnea on exertion, peripheral edema, prolonged cough, headaches, hemoptysis, abdominal pain, melena, hematochezia, severe indigestion/heartburn, hematuria, muscle weakness, suspicious skin lesions, difficulty walking, depression, unusual weight change, and abnormal bleeding.    Vital Signs:  Patient profile:   55 year old male Height:      70 inches Weight:      228.25 pounds BMI:     32.87 O2 Sat:      95 % on Room air Temp:     97.6 degrees F oral Pulse rate:   59 / minute BP sitting:   118 / 86  (left arm) Cuff size:   regular  Vitals Entered By: Gweneth Dimitri RN (November 30, 2009 4:27 PM)  O2 Flow:  Room air CC: Follow up.  Pt last seen by RA 08/2007.  Pt states he is wearing cpap from about 9pm til 4am everynight.  States he then will wake up around 4 am and cannot go back to sleep.  Pt c/o increased daytime sleepiness x 5months.  states no problems with mask.  Does state humidifier has broken on cpap so pressure is  dry.   Comments Medications reviewed with patient Daytime contact number verified with patient. Gweneth Dimitri RN  November 30, 2009 4:27 PM    Physical Exam  Additional Exam:  Gen. Pleasant, well-nourished, in no distress ENT - no lesions, no post nasal drip Neck: No JVD, no thyromegaly, no carotid bruits Lungs: no use of accessory muscles, no dullness to percussion, clear without rales or rhonchi  Cardiovascular: Rhythm regular, heart sounds  normal, no murmurs or gallops, no peripheral edema Musculoskeletal: No deformities, no cyanosis or clubbing       Impression & Recommendations:  Problem # 1:  SLEEP APNEA (ICD-780.57) Compliance encouraged, wt loss emphasized, asked to avoid meds with sedative side effects, cautioned against driving when sleepy. Obtain download , if pressure to be increased will place on auto & obtain another download Orders: DME Referral (DME) Est. Patient Level III ([address])  Medications Added to Medication List This Visit: 1)  Prozac 10 Mg Caps (Fluoxetine hcl) .... Once daily  Patient Instructions: 1)  Copy sent to: Dr Tawanna Cooler 2)  Please schedule a follow-up appointment in 3 months.  Appended Document: trouble sleeping/apc let him know - I have reviewed download - pressure adequate  Appended Document: trouble sleeping/apc pt informed. jwr

## 2010-09-14 NOTE — Letter (Signed)
Summary: Consent-General  [facility]  [address]   [place], [place] [address]   Phone: [phone]  Fax: [phone]    I hereby consent to, and authorize:   to perform the following procedure:   on patient: Vincent  Wilkerson   utilizing the following anesthetic or anesthesia: local  administered by the following individual:   My physician has explained the following to me in language that I understand: The nature of the treatment/procedure, the risks of the procedure, the possible complications of the procedure, the expected benefits or effects of the treatment/procedure, and any alternatives to the procedure and their risks and benefits.  I understand that no guarantees have been made to me concerning the results of treatment, surgery, or other procedures.  If unforeseen conditions require additional procedures, and it is not reasonably practical to obtain my consent, I authorize my physician to proceed as he/she considers advisable and in my best interest, unless otherwise specified as follows.  Exceptions, if any:   I have read the previous information, and I understand it.  Any questions which may have occurred to me have been answered to my satisfaction.  ______________________________________________ Patient Signature/Date/Time  ______________________________________________ Parent, Guardian, or Legal Representative/Date (State your relationship to the patient)  ______________________________________________ Witness Signature/Date/Time   

## 2010-09-17 NOTE — Miscellaneous (Signed)
Summary: Consent for Lesion Removal  Consent for Lesion Removal   Imported By: Maryln Gottron 05/04/2010 10:12:56  _____________________________________________________________________  External Attachment:    Type:   Image     Comment:   External Document

## 2010-12-28 NOTE — Assessment & Plan Note (Signed)
[facility]                             PULMONARY OFFICE NOTE   NAME:Wilkerson, Vincent                       MRN:          [id number]  DATE:07/09/2007                            DOB:          Jun 02, 1956    HISTORY OF PRESENT ILLNESS:  The patient is a pleasant 55 year old  Caucasian gentleman, Art gallery manager with [employer] who is referred for evaluation  of obstructive sleep apnea due to excessive daytime somnolence, loud  snoring, and witnessed apneas by his wife.  He underwent an overnight  polysomnogram in [place], [place] in January of 2003.  His weight then  was 220 pounds.  During the diagnostic phase, the respiratory  disturbance index (RDI) was 21 events per hour with an AHI of 14.9  events per hour.  There were 33 obstructive apneas and 50 hypopneas.  The lowest oxygen desaturation was 85%.  For supine sleep the AHI was  53.4 events per hour.  During a subsequent study, nasal CPAP was  initiated at 4 cm and titrated to 9 cm.  At this final pressure a total  of 3.5 hours of sleep was recorded, of which point 6 hours was REM  sleep.  The sleep disorder breathing was well controlled at this level.   Unfortunately, he stopped using his CPAP after a few months.  He states  that he did not feel any subjective improvement.  He also admits that he  could not put up with his father-in-law making jokes about the machine.  He now describes loud snoring.  His daughter has recorded his snoring on  one occasion.  His usual bedtime is around 9 p.m. and sleep latency is  about 15 minutes.  He awakens at least twice during the night to go to  the bathroom, but denies any postvoid sleep latency.  He is generally  awake by 3:30 a.m. and cannot fall asleep again.  Hence he has made it a  ritual to get up around that time, taking his dog out for a walk, and  gets some coffee until he finally goes into work around 6:30.  However,  lately he has started napping from 6 to 6:30  a.m. in a chair.  He has  gained about 5 pounds since his last study in 2003.  He drinks 3-4 cups  of coffee in a day.   PAST MEDICAL HISTORY:  Hyperlipidemia, depression and anxiety.   PAST SURGICAL HISTORY:  None.   ALLERGIES:  No known drug allergies.   CURRENT MEDICATIONS:  1. Zocor 40 mg daily.  2. Prozac 30 mg q.h.s.  3. Lamictal q.h.s.   SOCIAL HISTORY:  Never smoker.  He is a Producer, television/film/video and will occasionally  drink a glass of wine on the weekend.  He is married and lives with two  teenage children.   FAMILY HISTORY:  Congestive heart disease in his father.   REVIEW OF SYSTEMS:  Denies creepiness in his legs before going to sleep  or sleepiness during driving.  His Epworth's sleepiness score is 14:24.   PHYSICAL EXAMINATION:  VITAL SIGNS:  Weight 225 pounds, temperature  98.4.  Blood pressure 128/82, heart rate 75 per minute, oxygen  saturation 97% on room air.  HEENT:  Class III airway, long uvula, normal size tonsils, narrow nasal  turbinates.  NECK:  Circumference 17 inches.  CARDIOVASCULAR:  S1 and S2 normal.  CHEST:  Clear to auscultation.  ABDOMEN:  Soft and nontender with no organomegaly.  NEUROLOGY:  Nonfocal.  EXTREMITIES:  No edema.   LABORATORY DATA:  2002 shows BUN and creatinine of 16 and 1.0, TSH 1.14.   IMPRESSION:  1. At least moderate obstructive sleep apnea with hypopneas causing      sleep fragmentation and excessive daytime sleepiness.  2. This seemed to be corrected with a CPAP of +9 cm.  3. Depression and anxiety.   RECOMMENDATIONS:  1. We will reinstitute his CPAP at +9 cm.  He was sent over to our      sleep lab for a mask fit and seemed to prefer a ResMed medium size      Quattro Mirage mask.  Accordingly a prescription was written out      and will be sent to supply company.  2. I would question if at least part of his insomnia is related to him      still taking Prozac at night.  Maybe he should take this during the      daytime.   3. The need for CPAP compliance was again emphasized.  The      pathophysiology of sleep apnea and its cardiovascular consequences      and other modes of treatment including upper airway surgery and      oral appliances were discussed.     Oretha Milch, MD  Electronically Signed    RVA/MedQ  DD: 07/09/2007  DT: 07/09/2007  Job #: [id number]   cc:   Vincent Gens A. Tawanna Cooler, MD

## 2011-03-21 ENCOUNTER — Encounter: Payer: Self-pay | Admitting: Family Medicine

## 2011-03-22 ENCOUNTER — Ambulatory Visit (INDEPENDENT_AMBULATORY_CARE_PROVIDER_SITE_OTHER): Payer: Self-pay | Admitting: Family Medicine

## 2011-03-22 ENCOUNTER — Encounter: Payer: Self-pay | Admitting: Family Medicine

## 2011-03-22 DIAGNOSIS — G4720 Circadian rhythm sleep disorder, unspecified type: Secondary | ICD-10-CM

## 2011-03-22 DIAGNOSIS — L821 Other seborrheic keratosis: Secondary | ICD-10-CM | POA: Insufficient documentation

## 2011-03-22 MED ORDER — LORAZEPAM 0.5 MG PO TABS: ORAL_TABLET | ORAL | Status: DC

## 2011-03-22 NOTE — Progress Notes (Signed)
  Subjective:    Patient ID: Vincent Wilkerson, male    DOB: [birth year], 55 y.o.   MRN: [id number]  HPIMichael is a 55 year old male, who comes in today for evaluation of two problems.  About a year ago.  We removed the lesion from his back.  It turned out to be a basal cell carcinoma.  He would like that rechecked.  He also has a new lesion in his left shoulder.  His father died a month ago from congestive heart failure.  He would like something for sleep.  He said difficulty sleeping since his father's death.    Review of Systems Gentleman dermatologic review of systems, and psychiatric review of systems otherwise negative   Objective:   Physical Exam  Well-developed well-nourished, male in no acute distress.  Examination of back shows a previous area where we removed the basal cell carcinoma.  Midline, T2, well-healed.  No evidence of recurrence.  The lesion on his left shoulder is a seborrheic keratosis.  It was removed by gentle scratching      Assessment & Plan:  Basal cell carcinoma.  No evidence of recurrence.  Seborrheic keratosis reassured.  Sleep dysfunction, Ativan, .5 nightly, p.r.n.

## 2011-03-25 ENCOUNTER — Other Ambulatory Visit (INDEPENDENT_AMBULATORY_CARE_PROVIDER_SITE_OTHER): Payer: BC Managed Care – PPO

## 2011-03-25 DIAGNOSIS — Z0000 Encounter for general adult medical examination without abnormal findings: Secondary | ICD-10-CM

## 2011-03-25 LAB — POCT URINALYSIS DIPSTICK
Blood, UA: NEGATIVE
Glucose, UA: NEGATIVE
Nitrite, UA: NEGATIVE
pH, UA: 7

## 2011-03-25 LAB — CBC WITH DIFFERENTIAL/PLATELET
Basophils Absolute: 0.1 10*3/uL (ref 0.0–0.1)
Basophils Relative: 0.6 % (ref 0.0–3.0)
Lymphocytes Relative: 29.8 % (ref 12.0–46.0)
Lymphs Abs: 2.7 10*3/uL (ref 0.7–4.0)
MCHC: 34.1 g/dL (ref 30.0–36.0)
MCV: 95.5 fl (ref 78.0–100.0)
Neutro Abs: 5.4 10*3/uL (ref 1.4–7.7)
Neutrophils Relative %: 60.2 % (ref 43.0–77.0)
Platelets: 264 10*3/uL (ref 150.0–400.0)

## 2011-03-25 LAB — LIPID PANEL
HDL: 47.8 mg/dL (ref >39.00)
LDL Cholesterol: 103 mg/dL — ABNORMAL HIGH (ref 0–99)
Triglycerides: 86 mg/dL (ref 0.0–149.0)

## 2011-03-25 LAB — HEPATIC FUNCTION PANEL
AST: 28 U/L (ref 0–37)
Alkaline Phosphatase: 81 U/L (ref 39–117)
Bilirubin, Direct: 0.1 mg/dL (ref 0.0–0.3)
Total Bilirubin: 0.5 mg/dL (ref 0.3–1.2)
Total Protein: 6.6 g/dL (ref 6.0–8.3)

## 2011-03-25 LAB — BASIC METABOLIC PANEL
CO2: 31 mEq/L (ref 19–32)
Creatinine, Ser: 1 mg/dL (ref 0.4–1.5)
Sodium: 143 mEq/L (ref 135–145)

## 2011-04-11 ENCOUNTER — Ambulatory Visit (INDEPENDENT_AMBULATORY_CARE_PROVIDER_SITE_OTHER): Payer: BC Managed Care – PPO | Admitting: Family Medicine

## 2011-04-11 ENCOUNTER — Encounter: Payer: Self-pay | Admitting: Family Medicine

## 2011-04-11 DIAGNOSIS — F329 Major depressive disorder, single episode, unspecified: Secondary | ICD-10-CM

## 2011-04-11 DIAGNOSIS — L821 Other seborrheic keratosis: Secondary | ICD-10-CM

## 2011-04-11 DIAGNOSIS — E785 Hyperlipidemia, unspecified: Secondary | ICD-10-CM

## 2011-04-11 DIAGNOSIS — G4730 Sleep apnea, unspecified: Secondary | ICD-10-CM

## 2011-04-11 DIAGNOSIS — Z0000 Encounter for general adult medical examination without abnormal findings: Secondary | ICD-10-CM

## 2011-04-11 MED ORDER — SIMVASTATIN 40 MG PO TABS: 40.0000 mg | ORAL_TABLET | Freq: Every day | ORAL | Status: DC

## 2011-04-11 NOTE — Progress Notes (Signed)
  Subjective:    Patient ID: Vincent Wilkerson, male    DOB: 05-18-1956, 55 y.o.   MRN: [id number]  HPI     Vincent Wilkerson is a 55 year old, married male, nonsmoker, who comes in today for physical examination because of a history of hyperlipidemia.  He takes Zocor 40 mg nightly along with baby aspirin.  Lipids are goal and HDL 47, LDL 103.  He also takes Prozac 40 nightly, and lamictal 200 milligrams daily at the direction of Dr. Nolen Mu.  He also is then to pulmonary for an evaluation of sleep apnea and has been given CPAP.  However, he doesn't feel, like it's working.  He is only sleeping for about 4 hours.  Recommend he go back to pulmonary.      Review of Systems  Constitutional: Negative.   HENT: Negative.   Eyes: Negative.   Respiratory: Negative.   Cardiovascular: Negative.   Gastrointestinal: Negative.   Genitourinary: Negative.   Musculoskeletal: Negative.   Skin: Negative.   Neurological: Negative.   Hematological: Negative.   Psychiatric/Behavioral: Negative.        Objective:   Physical Exam  Constitutional: He is oriented to person, place, and time. He appears well-developed and well-nourished.  HENT:  Head: Normocephalic and atraumatic.  Right Ear: External ear normal.  Left Ear: External ear normal.  Nose: Nose normal.  Mouth/Throat: Oropharynx is clear and moist.  Eyes: Conjunctivae and EOM are normal. Pupils are equal, round, and reactive to light.  Neck: Normal range of motion. Neck supple. No JVD present. No tracheal deviation present. No thyromegaly present.  Cardiovascular: Normal rate, regular rhythm, normal heart sounds and intact distal pulses.  Exam reveals no gallop and no friction rub.   No murmur heard. Pulmonary/Chest: Effort normal and breath sounds normal. No stridor. No respiratory distress. He has no wheezes. He has no rales. He exhibits no tenderness.  Abdominal: Soft. Bowel sounds are normal. He exhibits no distension and no mass. There is no  tenderness. There is no rebound and no guarding.  Genitourinary: Rectum normal, prostate normal and penis normal. Guaiac negative stool. No penile tenderness.  Musculoskeletal: Normal range of motion. He exhibits no edema and no tenderness.  Lymphadenopathy:    He has no cervical adenopathy.  Neurological: He is alert and oriented to person, place, and time. He has normal reflexes. No cranial nerve deficit. He exhibits normal muscle tone.  Skin: Skin is warm and dry. No rash noted. No erythema. No pallor.  Psychiatric: He has a normal mood and affect. His behavior is normal. Judgment and thought content normal.          Assessment & Plan:  Healthy male.  Hyperlipidemia.  Continue Zocor 40 and an aspirin tablet.  History of depression.  Continue medication via Dr. Nolen Mu.  History of sleep apnea, however, not sleeping well, returned to pulmonary for eval

## 2011-04-11 NOTE — Patient Instructions (Signed)
Continue your current medications.  Call pulmonary to get a follow-up evaluation since it does appear that CPAP is working for you.  Return in one year, sooner if any problems

## 2011-05-13 ENCOUNTER — Ambulatory Visit: Payer: BC Managed Care – PPO | Admitting: Pulmonary Disease

## 2011-11-07 ENCOUNTER — Encounter: Payer: Self-pay | Admitting: Pulmonary Disease

## 2011-11-07 ENCOUNTER — Ambulatory Visit (INDEPENDENT_AMBULATORY_CARE_PROVIDER_SITE_OTHER): Payer: BC Managed Care – PPO | Admitting: Pulmonary Disease

## 2011-11-07 VITALS — BP 138/76 | HR 69 | Temp 97.6°F | Ht 70.0 in | Wt 238.4 lb

## 2011-11-07 DIAGNOSIS — G4733 Obstructive sleep apnea (adult) (pediatric): Secondary | ICD-10-CM

## 2011-11-07 NOTE — Assessment & Plan Note (Signed)
Get your machine checked out by [facility] in card so we can look at download Saline drops every night Nasal spray (nasonex) 1 each nare daily close to bedtime If nothing works, trial of Nuvigil to improve daytime somnolence  Weight loss encouraged, compliance with goal of at least 4-6 hrs every night is the expectation. Advised against medications with sedative side effects Cautioned against driving when sleepy - understanding that sleepiness will vary on a day to day basis

## 2011-11-07 NOTE — Patient Instructions (Signed)
Get your machine checked out by [facility] in card so we can look at download Saline drops every night Nasal spray (nasonex) 1 each nare daily close to bedtime If nothing works, trial of Nuvigil to improve daytime somnolence

## 2011-11-07 NOTE — Progress Notes (Signed)
  Subjective:    Patient ID: Vincent Wilkerson, male    DOB: [birth year], 56 y.o.   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 His weight then  was 220 pounds. During the diagnostic phase, the respiratorydisturbance index (RDI) was 21 events per hour with an AHI of 14.9events per hour. There were 33 obstructive apneas and 50 hypopneas.  The lowest oxygen desaturation was 85%. For supine sleep the AHI was 53.4 events per hour. During a subsequent study, nasal CPAP was initiated at 4 cm and titrated to 9 cm. At this final pressure a total of 3.5 hours of sleep was recorded, of which point 6 hours was REM sleep. He has been maintained on respironics 9 cm , medium mirage mask since   November 30, 2009  Last FU 1/09 -.  download 8/25- 11/29/09 >>, no residual events on 9 cm, excellent compliance    11/07/2011 last seen 11/2009. Pt states he wears his cpap machine everynight x 6 hrs a night. pt states he wakes up very dry mouth and has really bad sinus headaches, falling asleep at work.- has increased humidifier setting to max Gained 10 lbs since last visit  Bedtime 9-10 pm, minimal latency, wakes up around MN for BR visit, wakes up  at 4 am -not as rested as prior  Wakes up at 4a, Tired at work - 4 cups of coffee daily Wonders if he will qualify for new machine  Review of Systems Patient denies significant dyspnea,cough, hemoptysis,  chest pain, palpitations, pedal edema, orthopnea, paroxysmal nocturnal dyspnea, lightheadedness, nausea, vomiting, abdominal or  leg pains      Objective:   Physical Exam Gen. Pleasant, obese, in no distress ENT - no lesions, no post nasal drip, class 2 airway Neck: No JVD, no thyromegaly, no carotid bruits Lungs: no use of accessory muscles, no dullness to percussion, decreased without rales or rhonchi  Cardiovascular: Rhythm  regular, heart sounds  normal, no murmurs or gallops, no peripheral edema Musculoskeletal: No deformities, no cyanosis or clubbing , no tremors        Assessment & Plan:

## 2011-11-23 ENCOUNTER — Telehealth: Payer: Self-pay | Admitting: Pulmonary Disease

## 2011-11-23 MED ORDER — MOMETASONE FUROATE 50 MCG/ACT NA SUSP: 2.0000 | Freq: Every day | NASAL | Status: DC

## 2011-11-23 NOTE — Telephone Encounter (Signed)
Rx for nasonex refilled. Pt aware of CPAP download results and states nothing further needed.

## 2011-11-23 NOTE — Telephone Encounter (Signed)
Download reviewed 1/26-3/26/13 >> no residuals on cpap 9 cm, good usage No changes, ok to refill nasonex

## 2011-11-23 NOTE — Telephone Encounter (Signed)
No mention in chart about requesting / receiving records.  Dr Vassie Loll, have you received anything?  Thanks.    Also, may pt have refill on nasonex given at last ov?  LMOM TCB x1.

## 2011-11-28 ENCOUNTER — Encounter: Payer: Self-pay | Admitting: Pulmonary Disease

## 2011-11-30 ENCOUNTER — Ambulatory Visit (INDEPENDENT_AMBULATORY_CARE_PROVIDER_SITE_OTHER): Payer: BC Managed Care – PPO | Admitting: Pulmonary Disease

## 2011-11-30 ENCOUNTER — Encounter: Payer: Self-pay | Admitting: Pulmonary Disease

## 2011-11-30 VITALS — BP 130/78 | HR 67 | Temp 98.2°F | Ht 70.0 in | Wt 238.4 lb

## 2011-11-30 DIAGNOSIS — G4733 Obstructive sleep apnea (adult) (pediatric): Secondary | ICD-10-CM

## 2011-11-30 NOTE — Patient Instructions (Signed)
Samples of nuvigil -take at 8am Call me back to report We discussed use of melatonin 3-5 mg 3 h prior to bdetime - hold off on this for now

## 2011-11-30 NOTE — Progress Notes (Signed)
  Subjective:    Patient ID: Vincent Wilkerson, male    DOB: [birth year], 56 y.o.   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 His weight then  was 220 pounds. During the diagnostic phase, the respiratorydisturbance index (RDI) was 21 events per hour with an AHI of 14.9events per hour. There were 33 obstructive apneas and 50 hypopneas.  The lowest oxygen desaturation was 85%. For supine sleep the AHI was 53.4 events per hour. During a subsequent study, nasal CPAP was initiated at 4 cm and titrated to 9 cm. At this final pressure a total of 3.5 hours of sleep was recorded, of which point 6 hours was REM sleep. He has been maintained on respironics 9 cm , medium mirage mask since   November 30, 2009  Last FU 1/09 -.  download 8/25- 11/29/09 >>, no residual events on 9 cm, excellent compliance   11/07/2011  last seen 11/2009. Pt states he wears his cpap machine everynight x 6 hrs a night. pt states he wakes up very dry mouth and has really bad sinus headaches, falling asleep at work.- has increased humidifier setting to max  Gained 10 lbs since last visit  Bedtime 9-10 pm, minimal latency, wakes up around [place] for BR visit, wakes up at 4 am -not as rested as prior  Wakes up at 4a, Tired at work - 4 cups of coffee daily  Wonders if he will qualify for new machine  11/30/2011 Download reviewed 1/26-3/26/13 >> no residuals on cpap 9 cm, good usage  ok to refill nasonex Pt states he wears his cpap machine everynight x 7 hrs a night. Pt states he wakes up once a night when wearing cpap machine, feels tired during the day. Pt needs a new water container and strap for mask. His main issue is excessive daytime tiredness   Review of Systems Patient denies significant dyspnea,cough, hemoptysis,  chest pain, palpitations, pedal edema, orthopnea, paroxysmal nocturnal dyspnea,  lightheadedness, nausea, vomiting, abdominal or  leg pains      Objective:   Physical Exam  Gen. Pleasant, well-nourished, in no distress ENT - no lesions, no post nasal drip Neck: No JVD, no thyromegaly, no carotid bruits Lungs: no use of accessory muscles, no dullness to percussion, clear without rales or rhonchi  Cardiovascular: Rhythm regular, heart sounds  normal, no murmurs or gallops, no peripheral edema Musculoskeletal: No deformities, no cyanosis or clubbing        Assessment & Plan:

## 2011-12-01 NOTE — Assessment & Plan Note (Signed)
He has excessive daytime tiredness / somnolence inspite of good compliance. No cardiac comorbidiites Will trial modafinil - discussed side effects, asked to cut down caffeine intake - he will report back in a week ,if this works proceed with Rx. Weight loss encouraged, compliance with goal of at least 4-6 hrs every night is the expectation. Advised against medications with sedative side effects Cautioned against driving when sleepy - understanding that sleepiness will vary on a day to day basis Also discussed use of melatonin as a sleep aid.

## 2011-12-09 ENCOUNTER — Telehealth: Payer: Self-pay | Admitting: Pulmonary Disease

## 2011-12-09 MED ORDER — ARMODAFINIL 150 MG PO TABS: 150.0000 mg | ORAL_TABLET | Freq: Every day | ORAL | Status: DC

## 2011-12-09 NOTE — Telephone Encounter (Signed)
Addended by: Christen Butter on: 12/09/2011 01:37 PM   Modules accepted: Orders

## 2011-12-09 NOTE — Telephone Encounter (Addendum)
Rx was called to pharm.  Spoke with pt and notified that this was done.  He is aware to f/u with RA- plans to come back in Oct as d/w RA at last ov.  He is to call sooner if having problems Msg sent to RA to make him aware

## 2011-12-09 NOTE — Telephone Encounter (Signed)
LMTCB

## 2011-12-09 NOTE — Telephone Encounter (Signed)
Agree with continuing Nuvigil 150mg  qd. Thanks.

## 2011-12-09 NOTE — Telephone Encounter (Signed)
Pt returned call. Vincent Wilkerson  

## 2011-12-09 NOTE — Telephone Encounter (Signed)
Spoke with pt. He is calling to give update per RA's request on nuvigil that was given at last ov. He states that he finished the last sample today. The medication is working very well for him and he denies still having any day time sleepiness. Has not had any s/e from med. Wants rx called in today since he is now out of samples. RA scheduled off. Will have to forward to doc of the day for recs. RB, please advise, thanks!

## 2011-12-09 NOTE — Telephone Encounter (Signed)
Needs to f/u w RA

## 2011-12-12 ENCOUNTER — Other Ambulatory Visit: Payer: Self-pay | Admitting: *Deleted

## 2011-12-12 DIAGNOSIS — E785 Hyperlipidemia, unspecified: Secondary | ICD-10-CM

## 2011-12-12 MED ORDER — SIMVASTATIN 40 MG PO TABS: 40.0000 mg | ORAL_TABLET | Freq: Every day | ORAL | Status: DC

## 2011-12-13 ENCOUNTER — Other Ambulatory Visit: Payer: Self-pay | Admitting: Pulmonary Disease

## 2011-12-13 MED ORDER — MOMETASONE FUROATE 50 MCG/ACT NA SUSP: 2.0000 | Freq: Every day | NASAL | Status: DC

## 2011-12-15 ENCOUNTER — Telehealth: Payer: Self-pay | Admitting: Emergency Medicine

## 2011-12-15 NOTE — Telephone Encounter (Signed)
Member ID #[id number]. Awaiting fax for PA.

## 2011-12-15 NOTE — Telephone Encounter (Signed)
Forms completed and placed in RB look at for review and signature. RX was written for patient while Dr. Vassie Loll out of the office.

## 2011-12-16 ENCOUNTER — Telehealth: Payer: Self-pay | Admitting: Pulmonary Disease

## 2011-12-16 NOTE — Telephone Encounter (Signed)
Per 12/15/11 phone note. Forms was placed in rb look at for him to sign so we can fax over to the pharm for PA.--lmomtcb x1 for pt to make aware

## 2011-12-19 NOTE — Telephone Encounter (Signed)
Called, spoke with pt.  I informed him form was placed in Dr. Kavin Leech look at on 12/15/11. Pt reports he spoke with someone on Friday who advised this would be taken care of on Friday.  Lawson Fiscal, do you know if this was done?    Also, pt would like samples of nuvigil if available as he is out and now medication needs PA.  Dr. Delton Coombes, if we have samples availabe, is it ok to give pt one?  Pls advise. Thank you.

## 2011-12-20 NOTE — Telephone Encounter (Signed)
Pt called back and asks that a nurse call him back asap since this needs to have been taken care of already. Hazel Sams

## 2011-12-20 NOTE — Telephone Encounter (Signed)
Lawson Fiscal, Please advise if RB every did the PA for this pt's nuvigil, thanks!

## 2011-12-20 NOTE — Telephone Encounter (Signed)
PA form has been faxed. Awaiting response.

## 2011-12-20 NOTE — Telephone Encounter (Signed)
done

## 2011-12-20 NOTE — Telephone Encounter (Signed)
Form has not been completed by RB. Will get him to sign this today so we can fax this back.

## 2011-12-27 NOTE — Telephone Encounter (Signed)
No sign of approval/denial letter in triage or in RB's lookat.  Called [facility] to see if they have received notification; they have not.  No phone number provided on PA form so technician at [facility] was nice enough to run the rx again and will fax insurance info to the office.  Will await this and call to check on the status of PA.

## 2011-12-28 NOTE — Telephone Encounter (Signed)
Fax from [facility] received with a request to have a copy of pt's sleep study faxed.  Copy of 2003 sleep study printed from old emr system and faxed to [facility] PA number on form.  Will continue to wait for response.  Fax w/ sleep study placed with original fax done by [facility].  Message routed back Lori's inbox.

## 2011-12-29 NOTE — Telephone Encounter (Signed)
nuvigil PA has been approved through 09/22/2014.  Will scan approval into the system

## 2012-04-10 ENCOUNTER — Other Ambulatory Visit (INDEPENDENT_AMBULATORY_CARE_PROVIDER_SITE_OTHER): Payer: BC Managed Care – PPO

## 2012-04-10 DIAGNOSIS — Z0000 Encounter for general adult medical examination without abnormal findings: Secondary | ICD-10-CM

## 2012-04-10 LAB — CBC WITH DIFFERENTIAL/PLATELET
Basophils Absolute: 0.1 10*3/uL (ref 0.0–0.1)
Basophils Relative: 0.7 % (ref 0.0–3.0)
Eosinophils Absolute: 0.2 10*3/uL (ref 0.0–0.7)
Eosinophils Relative: 2.4 % (ref 0.0–5.0)
Hemoglobin: 16.1 g/dL (ref 13.0–17.0)
Lymphocytes Relative: 33.2 % (ref 12.0–46.0)
MCHC: 33.8 g/dL (ref 30.0–36.0)
MCV: 94.6 fl (ref 78.0–100.0)
Monocytes Relative: 8.6 % (ref 3.0–12.0)
Platelets: 268 10*3/uL (ref 150.0–400.0)
RDW: 13.3 % (ref 11.5–14.6)
WBC: 8.3 10*3/uL (ref 4.5–10.5)

## 2012-04-10 LAB — BASIC METABOLIC PANEL
CO2: 29 mEq/L (ref 19–32)
Creatinine, Ser: 1 mg/dL (ref 0.4–1.5)
Glucose, Bld: 87 mg/dL (ref 70–99)
Potassium: 4.7 mEq/L (ref 3.5–5.1)

## 2012-04-10 LAB — HEPATIC FUNCTION PANEL
AST: 32 U/L (ref 0–37)
Alkaline Phosphatase: 80 U/L (ref 39–117)
Bilirubin, Direct: 0.1 mg/dL (ref 0.0–0.3)
Total Bilirubin: 0.7 mg/dL (ref 0.3–1.2)
Total Protein: 6.4 g/dL (ref 6.0–8.3)

## 2012-04-10 LAB — POCT URINALYSIS DIPSTICK
Glucose, UA: NEGATIVE
Leukocytes, UA: NEGATIVE
Protein, UA: NEGATIVE

## 2012-04-10 LAB — LIPID PANEL
Triglycerides: 124 mg/dL (ref 0.0–149.0)
VLDL: 24.8 mg/dL (ref 0.0–40.0)

## 2012-04-10 LAB — TSH: TSH: 1.07 u[IU]/mL (ref 0.35–5.50)

## 2012-04-10 LAB — LDL CHOLESTEROL, DIRECT: Direct LDL: 127.1 mg/dL

## 2012-04-17 ENCOUNTER — Encounter: Payer: BC Managed Care – PPO | Admitting: Family Medicine

## 2012-04-26 ENCOUNTER — Encounter: Payer: Self-pay | Admitting: Family Medicine

## 2012-04-26 ENCOUNTER — Ambulatory Visit (INDEPENDENT_AMBULATORY_CARE_PROVIDER_SITE_OTHER): Payer: BC Managed Care – PPO | Admitting: Family Medicine

## 2012-04-26 VITALS — BP 140/80 | Temp 98.1°F | Ht 69.5 in | Wt 232.0 lb

## 2012-04-26 DIAGNOSIS — G4733 Obstructive sleep apnea (adult) (pediatric): Secondary | ICD-10-CM

## 2012-04-26 DIAGNOSIS — Z23 Encounter for immunization: Secondary | ICD-10-CM

## 2012-04-26 DIAGNOSIS — G4720 Circadian rhythm sleep disorder, unspecified type: Secondary | ICD-10-CM

## 2012-04-26 DIAGNOSIS — E785 Hyperlipidemia, unspecified: Secondary | ICD-10-CM

## 2012-04-26 MED ORDER — SIMVASTATIN 40 MG PO TABS: 40.0000 mg | ORAL_TABLET | Freq: Every day | ORAL | Status: DC

## 2012-04-26 MED ORDER — MOMETASONE FUROATE 50 MCG/ACT NA SUSP: 2.0000 | Freq: Every day | NASAL | Status: DC

## 2012-04-26 NOTE — Progress Notes (Signed)
  Subjective:    Patient ID: Vincent Wilkerson, male    DOB: [birth year], 56 y.o.   MRN: [id number]  HPI Vincent Wilkerson is a 55 year old married male nonsmoker who comes in today for general physical examination  He has a history of underlying allergic rhinitis for which she uses steroid nasal spray nightly  He has a history of hyperlipidemia he takes Zocor 40 mg daily  In the past he carried a history of depression and was on Prozac and Lamictal however sleep study shows significant sleep apnea he is on CPAP and no visual and now feels much better. He feels his depression wasn't really depression but symptoms of sleep apnea  Tetanus 2007 seasonal flu shot today  He sees Dr. Marko Stai on a regular basis and is on CPAP in addition to the new vigil   Review of Systems  Constitutional: Negative.   HENT: Negative.   Eyes: Negative.   Respiratory: Negative.   Cardiovascular: Negative.   Gastrointestinal: Negative.   Genitourinary: Negative.   Musculoskeletal: Negative.   Skin: Negative.   Neurological: Negative.   Hematological: Negative.   Psychiatric/Behavioral: Negative.        Objective:   Physical Exam  Constitutional: He is oriented to person, place, and time. He appears well-developed and well-nourished. No distress.  HENT:  Head: Normocephalic and atraumatic.  Right Ear: External ear normal.  Left Ear: External ear normal.  Nose: Nose normal.  Mouth/Throat: Oropharynx is clear and moist.  Eyes: Conjunctivae normal and EOM are normal. Pupils are equal, round, and reactive to light. Right eye exhibits no discharge. Left eye exhibits no discharge. No scleral icterus.  Neck: Normal range of motion. Neck supple. No JVD present. No tracheal deviation present. No thyromegaly present.  Cardiovascular: Normal rate, regular rhythm, normal heart sounds and intact distal pulses.  Exam reveals no gallop and no friction rub.   No murmur heard. Pulmonary/Chest: Effort normal and breath sounds  normal. No stridor. No respiratory distress. He has no wheezes. He has no rales. He exhibits no tenderness.  Abdominal: Soft. Bowel sounds are normal. He exhibits no distension and no mass. There is no tenderness. There is no rebound and no guarding.  Genitourinary: Rectum normal, prostate normal and penis normal. Guaiac negative stool. No penile tenderness.  Musculoskeletal: Normal range of motion. He exhibits no edema and no tenderness.  Lymphadenopathy:    He has no cervical adenopathy.  Neurological: He is alert and oriented to person, place, and time. He has normal reflexes. No cranial nerve deficit. He exhibits normal muscle tone.  Skin: Skin is warm and dry. No rash noted. He is not diaphoretic. No erythema. No pallor.  Psychiatric: He has a normal mood and affect. His behavior is normal. Judgment and thought content normal.          Assessment & Plan:  Healthy male  History of sleep apnea continue CPAP and new vigil  Allergic rhinitis continue near steroid nasal spray each bedtime  Hyperlipidemia continue Zocor followup in 1 year sooner if any problem

## 2012-04-26 NOTE — Patient Instructions (Signed)
Continue current medications  Followup in 1 year sooner if any problem  Think about starting an exercise program 30 minutes daily

## 2012-04-30 ENCOUNTER — Telehealth: Payer: Self-pay | Admitting: Family Medicine

## 2012-04-30 DIAGNOSIS — E785 Hyperlipidemia, unspecified: Secondary | ICD-10-CM

## 2012-04-30 MED ORDER — SIMVASTATIN 40 MG PO TABS: 40.0000 mg | ORAL_TABLET | Freq: Every day | ORAL | Status: DC

## 2012-04-30 NOTE — Telephone Encounter (Signed)
Patient called stating that [facility] never received his refill and no longer needs the nasonex only need the simvastatin resent to [facility]. Please assist.

## 2012-06-01 ENCOUNTER — Ambulatory Visit (INDEPENDENT_AMBULATORY_CARE_PROVIDER_SITE_OTHER): Payer: BC Managed Care – PPO | Admitting: Pulmonary Disease

## 2012-06-01 ENCOUNTER — Encounter: Payer: Self-pay | Admitting: Pulmonary Disease

## 2012-06-01 VITALS — BP 114/72 | HR 66 | Temp 97.8°F | Ht 70.0 in | Wt 236.6 lb

## 2012-06-01 DIAGNOSIS — G4733 Obstructive sleep apnea (adult) (pediatric): Secondary | ICD-10-CM

## 2012-06-01 MED ORDER — ARMODAFINIL 150 MG PO TABS: 150.0000 mg | ORAL_TABLET | Freq: Every day | ORAL | Status: DC

## 2012-06-01 NOTE — Patient Instructions (Signed)
Your cpap is set at 9cm Continue nuvigil 150 mg daily

## 2012-06-01 NOTE — Progress Notes (Signed)
  Subjective:    Patient ID: Vincent Wilkerson, male    DOB: May 11, 1956, 56 y.o.   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 His weight then  was 220 pounds. During the diagnostic phase, the (RDI) was 21 events per hour with an AHI of 14.9 events per hour. There were 33 obstructive apneas and 50 hypopneas.  The lowest oxygen desaturation was 85%. For supine sleep the AHI was 53.4 events per hour. During a subsequent study, nasal CPAP was initiated at 4 cm and titrated to 9 cm. At this final pressure a total of 3.5 hours of sleep was recorded, of which point 6 hours was REM sleep. He has been maintained on respironics 9 cm , medium mirage mask since   download 8/25- 11/29/09 >>, no residual events on 9 cm, excellent compliance  Download reviewed 1/26-3/26/13 >> no residuals on cpap 9 cm, good usage    06/01/2012  wears cpap everynight x 6-7 hrs a night. denies any problems w/ mask/machine. is sleeping better. the nuvigil added 4/13  has helped. he can function better now Has cut down coffee which in turn has decreased nocturia No chest pain, palpitations Takes nuvigil around 7am - goes to work early  Review of Systems neg for any significant sore throat, dysphagia, itching, sneezing, nasal congestion or excess/ purulent secretions, fever, chills, sweats, unintended wt loss, pleuritic or exertional cp, hempoptysis, orthopnea pnd or change in chronic leg swelling. Also denies presyncope, palpitations, heartburn, abdominal pain, nausea, vomiting, diarrhea or change in bowel or urinary habits, dysuria,hematuria, rash, arthralgias, visual complaints, headache, numbness weakness or ataxia.     Objective:   Physical Exam  Gen. Pleasant, obese, in no distress ENT - no lesions, no post nasal drip Neck: No JVD, no thyromegaly, no carotid bruits Lungs: no use of  accessory muscles, no dullness to percussion, decreased without rales or rhonchi  Cardiovascular: Rhythm regular, heart sounds  normal, no murmurs or gallops, no peripheral edema Musculoskeletal: No deformities, no cyanosis or clubbing , no tremors        Assessment & Plan:

## 2012-06-03 NOTE — Assessment & Plan Note (Addendum)
Moderate OSA, on cpap 9cm, good compliance nuvigil added 4/13 for residual sleepiness inspite of good compliance  Weight loss encouraged, compliance with goal of at least 4-6 hrs every night is the expectation. Advised against medications with sedative side effects Cautioned against driving when sleepy - understanding that sleepiness will vary on a day to day basis  OK to ct nuvigil, hopefully he will not develop tolerance 79m FU while we prescribe this drug - alternatively he can obtain from PCP & keep annual FU with [place]

## 2012-07-05 ENCOUNTER — Other Ambulatory Visit: Payer: Self-pay | Admitting: Emergency Medicine

## 2012-07-06 ENCOUNTER — Telehealth: Payer: Self-pay | Admitting: Pulmonary Disease

## 2012-07-06 MED ORDER — ARMODAFINIL 150 MG PO TABS: 150.0000 mg | ORAL_TABLET | Freq: Every day | ORAL | Status: DC

## 2012-07-06 NOTE — Telephone Encounter (Signed)
Called and spoke with pt and he is requesting to have this medication called to the pharmacy for the nuvigil.  i called this to the pharmacy with 5 refills. Nothing further is needed.

## 2012-07-10 ENCOUNTER — Ambulatory Visit: Payer: BC Managed Care – PPO | Admitting: Family Medicine

## 2013-01-15 ENCOUNTER — Encounter: Payer: Self-pay | Admitting: Family Medicine

## 2013-01-15 ENCOUNTER — Ambulatory Visit (INDEPENDENT_AMBULATORY_CARE_PROVIDER_SITE_OTHER): Payer: BC Managed Care – PPO | Admitting: Family Medicine

## 2013-01-15 VITALS — BP 140/94 | Temp 98.5°F | Wt 230.0 lb

## 2013-01-15 DIAGNOSIS — Z0000 Encounter for general adult medical examination without abnormal findings: Secondary | ICD-10-CM

## 2013-01-15 DIAGNOSIS — G4720 Circadian rhythm sleep disorder, unspecified type: Secondary | ICD-10-CM

## 2013-01-15 DIAGNOSIS — L820 Inflamed seborrheic keratosis: Secondary | ICD-10-CM

## 2013-01-15 MED ORDER — ARMODAFINIL 150 MG PO TABS: 150.0000 mg | ORAL_TABLET | Freq: Every day | ORAL | Status: DC

## 2013-01-15 NOTE — Progress Notes (Signed)
  Subjective:    Patient ID: Vincent Wilkerson, male    DOB: 1955/11/23, 57 y.o.   MRN: [id number]  HPI Vincent Wilkerson is a 57 year old married male nonsmoker who comes in today for removal of 2 lesions on his face  The first lesion is 8 mm x 8 mm it's raised it's pigmented yellow and is irritated and red and growing right forehead  Lesion #2 is 8 mm x 8 mm again yellow raised with red margins left forehead  After informed consent both lesions were anesthetized and removed with 3 mm margins. The bases were cauterized Band-Aids were applied the lesions were sent for pathologic analysis. He tolerated the procedure no complications.  Meds reviewed unchanged he's currently on Lamictal 100 mg daily from Dr. Nolen Mu in [facility] at the psychiatry office for mood changes and doing well. He also takes new visual 150 mg daily for hypersomnolence   Review of Systems    procedure review of systems negative Objective:   Physical Exam  Procedure see above      Assessment & Plan:  Lesions appear to be inflamed seborrheic keratosis  Pending pathology  Hypersomnia continue [facility] followup in August CPX

## 2013-01-15 NOTE — Patient Instructions (Addendum)
Remove the Band-Aids tomorrow  We will call you the path report within 2 weeks  Set up your annual exam in August  Fasting labs one week prior

## 2013-03-28 ENCOUNTER — Other Ambulatory Visit: Payer: Self-pay | Admitting: Family Medicine

## 2013-04-01 ENCOUNTER — Telehealth: Payer: Self-pay | Admitting: Family Medicine

## 2013-04-01 MED ORDER — ARMODAFINIL 150 MG PO TABS: ORAL_TABLET | ORAL | Status: DC

## 2013-04-01 NOTE — Telephone Encounter (Signed)
Pt is calling to request a refill on NUVIGIL 150 MG tablet pt states that he only has 1.5 weeks left on his med. Pt is schedule for his CPX on 9/15 but does not have enough meds to get him there. Please contact pt.

## 2013-04-01 NOTE — Telephone Encounter (Signed)
Rx called in to pharmacy. 

## 2013-04-22 ENCOUNTER — Other Ambulatory Visit (INDEPENDENT_AMBULATORY_CARE_PROVIDER_SITE_OTHER): Payer: BC Managed Care – PPO

## 2013-04-22 DIAGNOSIS — Z0000 Encounter for general adult medical examination without abnormal findings: Secondary | ICD-10-CM

## 2013-04-22 LAB — POCT URINALYSIS DIPSTICK
Blood, UA: NEGATIVE
Ketones, UA: NEGATIVE
Protein, UA: NEGATIVE
Spec Grav, UA: 1.01
Urobilinogen, UA: 0.2
pH, UA: 7

## 2013-04-22 LAB — HEPATIC FUNCTION PANEL
ALT: 30 U/L (ref 0–53)
AST: 28 U/L (ref 0–37)
Albumin: 4.2 g/dL (ref 3.5–5.2)
Alkaline Phosphatase: 71 U/L (ref 39–117)
Bilirubin, Direct: 0.1 mg/dL (ref 0.0–0.3)
Total Protein: 6.4 g/dL (ref 6.0–8.3)

## 2013-04-22 LAB — CBC WITH DIFFERENTIAL/PLATELET
Basophils Absolute: 0.1 10*3/uL (ref 0.0–0.1)
Eosinophils Relative: 2.3 % (ref 0.0–5.0)
HCT: 46.9 % (ref 39.0–52.0)
Hemoglobin: 16.3 g/dL (ref 13.0–17.0)
Lymphocytes Relative: 33.2 % (ref 12.0–46.0)
Lymphs Abs: 2.8 10*3/uL (ref 0.7–4.0)
Monocytes Relative: 8.6 % (ref 3.0–12.0)
Platelets: 255 10*3/uL (ref 150.0–400.0)
RDW: 12.9 % (ref 11.5–14.6)
WBC: 8.3 10*3/uL (ref 4.5–10.5)

## 2013-04-22 LAB — BASIC METABOLIC PANEL
Calcium: 9.7 mg/dL (ref 8.4–10.5)
GFR: 90.75 mL/min (ref >60.00)
Glucose, Bld: 89 mg/dL (ref 70–99)
Potassium: 4.8 mEq/L (ref 3.5–5.1)
Sodium: 139 mEq/L (ref 135–145)

## 2013-04-22 LAB — LIPID PANEL
Cholesterol: 176 mg/dL (ref 0–200)
HDL: 49.3 mg/dL (ref >39.00)
LDL Cholesterol: 108 mg/dL — ABNORMAL HIGH (ref 0–99)
VLDL: 18.8 mg/dL (ref 0.0–40.0)

## 2013-04-22 LAB — PSA: PSA: 1.82 ng/mL (ref 0.10–4.00)

## 2013-04-29 ENCOUNTER — Encounter: Payer: Self-pay | Admitting: Family

## 2013-04-29 ENCOUNTER — Ambulatory Visit (INDEPENDENT_AMBULATORY_CARE_PROVIDER_SITE_OTHER): Payer: BC Managed Care – PPO | Admitting: Family

## 2013-04-29 ENCOUNTER — Encounter: Payer: BC Managed Care – PPO | Admitting: Family Medicine

## 2013-04-29 VITALS — BP 116/80 | HR 83 | Ht 69.0 in | Wt 232.0 lb

## 2013-04-29 DIAGNOSIS — Z0000 Encounter for general adult medical examination without abnormal findings: Secondary | ICD-10-CM

## 2013-04-29 DIAGNOSIS — E7800 Pure hypercholesterolemia, unspecified: Secondary | ICD-10-CM

## 2013-04-29 DIAGNOSIS — G4733 Obstructive sleep apnea (adult) (pediatric): Secondary | ICD-10-CM

## 2013-04-29 DIAGNOSIS — G47419 Narcolepsy without cataplexy: Secondary | ICD-10-CM

## 2013-04-29 MED ORDER — LAMOTRIGINE 100 MG PO TABS: 100.0000 mg | ORAL_TABLET | Freq: Every day | ORAL | Status: DC

## 2013-04-29 NOTE — Progress Notes (Signed)
Subjective:    Patient ID: Vincent Wilkerson, male    DOB: 1955-12-13, 57 y.o.   MRN: [id number]  HPI 57 year old white male, nonsmoker, patient of Dr. Tawanna Cooler presents for yearly preventative medicine examination. All immunizations and health maintenance protocols were reviewed with the patient and they are up to date with these protocols. Screening laboratory values were reviewed with the patient including screening of hyperlipidemia PSA renal function and hepatic function. There medications past medical history social history problem list and allergies were reviewed in detail. Goals were established with regard to weight loss exercise diet in compliance with medications   Review of Systems  Constitutional: Negative.   HENT: Negative.   Eyes: Negative.   Respiratory: Negative.   Cardiovascular: Negative.   Gastrointestinal: Negative.   Endocrine: Negative.   Genitourinary: Negative.   Musculoskeletal: Negative.   Skin: Negative.   Allergic/Immunologic: Negative.   Neurological: Negative.   Hematological: Negative.   Psychiatric/Behavioral: Negative.    Past Medical History  Diagnosis Date  . Depression   . Sleep apnea   . Hyperlipidemia   . Pneumonia     History   Social History  . Marital Status: Married    Spouse Name: N/A    Number of Children: N/A  . Years of Education: N/A   Occupational History  . Not on file.   Social History Main Topics  . Smoking status: Never Smoker   . Smokeless tobacco: Not on file  . Alcohol Use: No  . Drug Use: No  . Sexual Activity:    Other Topics Concern  . Not on file   Social History Narrative  . No narrative on file    No past surgical history on file.  Family History  Problem Relation Age of Onset  . Depression Mother   . Alcohol abuse Mother   . Gallbladder disease Father   . Heart disease Father   . Polycythemia Father   . Alcohol abuse Sister     No Known Allergies  Current Outpatient Prescriptions on File Prior  to Visit  Medication Sig Dispense Refill  . simvastatin (ZOCOR) 40 MG tablet Take 1 tablet (40 mg total) by mouth at bedtime.  100 tablet  3  . Armodafinil (NUVIGIL) 150 MG tablet TAKE 1 TABLET BY MOUTH EVERY DAY  90 tablet  0  . mometasone (NASONEX) 50 MCG/ACT nasal spray Place 2 sprays into the nose daily.  51 g  3   No current facility-administered medications on file prior to visit.    BP 116/80  Pulse 83  Ht 5\' 9"  (1.753 m)  Wt 232 lb (105.235 kg)  BMI 34.24 kg/m2chart    Objective:   Physical Exam  Constitutional: He is oriented to person, place, and time. He appears well-developed and well-nourished.  HENT:  Head: Normocephalic and atraumatic.  Right Ear: External ear normal.  Left Ear: External ear normal.  Nose: Nose normal.  Mouth/Throat: Oropharynx is clear and moist.  Eyes: Conjunctivae and EOM are normal. Pupils are equal, round, and reactive to light.  Neck: Normal range of motion. Neck supple. No thyromegaly present.  Cardiovascular: Normal rate, regular rhythm and normal heart sounds.   Pulmonary/Chest: Effort normal and breath sounds normal.  Abdominal: Soft. Bowel sounds are normal. He exhibits no distension. There is no tenderness. There is no rebound and no guarding.  Genitourinary: Rectum normal, prostate normal and penis normal. Guaiac negative stool. No penile tenderness.  Musculoskeletal: Normal range of motion. He exhibits no  edema and no tenderness.  Neurological: He is alert and oriented to person, place, and time. He has normal reflexes. He displays normal reflexes. No cranial nerve deficit. Coordination normal.  Skin: Skin is warm and dry.  Psychiatric: He has a normal mood and affect.          Assessment & Plan:  Assessment: 1. Complete physical exam  2. Hyperlipidemia 3. Sleep apnea 4. Narcolepsy  Plan: Patient wishes for Dr. Tawanna Cooler to take over his stomach the prescription since his psychiatrist will be retiring soon. He's been stable in  the 200 mg once daily. Encouraged healthy diet, exercise daily. Recheck in 6 months or sooner if needed.

## 2013-04-29 NOTE — Patient Instructions (Addendum)
Exercise to Stay Healthy Exercise helps you become and stay healthy. EXERCISE IDEAS AND TIPS Choose exercises that:  You enjoy.  Fit into your day. You do not need to exercise really hard to be healthy. You can do exercises at a slow or medium level and stay healthy. You can:  Stretch before and after working out.  Try yoga, Pilates, or tai chi.  Lift weights.  Walk fast, swim, jog, run, climb stairs, bicycle, dance, or rollerskate.  Take aerobic classes. Exercises that burn about 150 calories:  Running 1  miles in 15 minutes.  Playing volleyball for 45 to 60 minutes.  Washing and waxing a car for 45 to 60 minutes.  Playing touch football for 45 minutes.  Walking 1  miles in 35 minutes.  Pushing a stroller 1  miles in 30 minutes.  Playing basketball for 30 minutes.  Raking leaves for 30 minutes.  Bicycling 5 miles in 30 minutes.  Walking 2 miles in 30 minutes.  Dancing for 30 minutes.  Shoveling snow for 15 minutes.  Swimming laps for 20 minutes.  Walking up stairs for 15 minutes.  Bicycling 4 miles in 15 minutes.  Gardening for 30 to 45 minutes.  Jumping rope for 15 minutes.  Washing windows or floors for 45 to 60 minutes. Document Released: 09/03/2010 Document Revised: 10/24/2011 Document Reviewed: 09/03/2010 [employer] Patient Information 2014 [employer].  

## 2013-05-27 ENCOUNTER — Telehealth: Payer: Self-pay | Admitting: Family Medicine

## 2013-05-27 DIAGNOSIS — E785 Hyperlipidemia, unspecified: Secondary | ICD-10-CM

## 2013-05-27 DIAGNOSIS — G4720 Circadian rhythm sleep disorder, unspecified type: Secondary | ICD-10-CM

## 2013-05-27 MED ORDER — SIMVASTATIN 40 MG PO TABS: 40.0000 mg | ORAL_TABLET | Freq: Every day | ORAL | Status: DC

## 2013-05-27 MED ORDER — ARMODAFINIL 150 MG PO TABS: ORAL_TABLET | ORAL | Status: DC

## 2013-05-27 MED ORDER — MOMETASONE FUROATE 50 MCG/ACT NA SUSP: 2.0000 | Freq: Every day | NASAL | Status: DC

## 2013-05-27 NOTE — Telephone Encounter (Signed)
Pt needs refill of simvastatin (ZOCOR) 40 MG tablet mometasone (NASONEX) 50 MCG/ACT nasal spray [facility]  Pt would like you to know he will need Armodafinil (NUVIGIL) 150 MG tablet soon [facility]/ [address]

## 2013-05-27 NOTE — Telephone Encounter (Signed)
Rx sent to pharmacy   

## 2013-06-04 ENCOUNTER — Other Ambulatory Visit: Payer: Self-pay | Admitting: *Deleted

## 2013-06-04 DIAGNOSIS — E785 Hyperlipidemia, unspecified: Secondary | ICD-10-CM

## 2013-06-04 DIAGNOSIS — G4720 Circadian rhythm sleep disorder, unspecified type: Secondary | ICD-10-CM

## 2013-06-04 MED ORDER — MOMETASONE FUROATE 50 MCG/ACT NA SUSP: 2.0000 | Freq: Every day | NASAL | Status: DC

## 2013-06-04 MED ORDER — SIMVASTATIN 40 MG PO TABS: 40.0000 mg | ORAL_TABLET | Freq: Every day | ORAL | Status: DC

## 2013-08-22 ENCOUNTER — Telehealth: Payer: Self-pay | Admitting: Family Medicine

## 2013-08-22 NOTE — Telephone Encounter (Signed)
Pt checking status of prior authorization for Armodafinil (NUVIGIL) 150 MG tablet.

## 2013-08-28 ENCOUNTER — Other Ambulatory Visit: Payer: Self-pay | Admitting: *Deleted

## 2013-08-28 MED ORDER — ARMODAFINIL 150 MG PO TABS: ORAL_TABLET | ORAL | Status: DC

## 2013-09-19 NOTE — Telephone Encounter (Signed)
I called [facility] and was advised the pt was able to fill his script.

## 2014-04-01 ENCOUNTER — Other Ambulatory Visit: Payer: Self-pay | Admitting: Family Medicine

## 2014-04-02 ENCOUNTER — Telehealth: Payer: Self-pay | Admitting: Family Medicine

## 2014-04-02 NOTE — Telephone Encounter (Signed)
Pt would like for you to give him a call regarding a rx issues, states you helped him with his issue previously.

## 2014-04-03 NOTE — Telephone Encounter (Signed)
Spoke with patient and refill ready at pharmacy.

## 2014-05-07 ENCOUNTER — Other Ambulatory Visit: Payer: Self-pay | Admitting: Family

## 2014-06-24 ENCOUNTER — Ambulatory Visit (INDEPENDENT_AMBULATORY_CARE_PROVIDER_SITE_OTHER): Payer: BC Managed Care – PPO | Admitting: Family Medicine

## 2014-06-24 ENCOUNTER — Encounter: Payer: Self-pay | Admitting: Family Medicine

## 2014-06-24 VITALS — BP 140/90 | Temp 98.5°F | Wt 229.0 lb

## 2014-06-24 DIAGNOSIS — R0981 Nasal congestion: Secondary | ICD-10-CM | POA: Insufficient documentation

## 2014-06-24 DIAGNOSIS — Z23 Encounter for immunization: Secondary | ICD-10-CM

## 2014-06-24 NOTE — Patient Instructions (Signed)
Zyrtec 10 mg plain.......... One tablet at bedtime  Steroid nasal spray...Marland KitchenMarland KitchenMarland Kitchen 1 shot up each nostril at bedtime

## 2014-06-24 NOTE — Progress Notes (Signed)
Pre visit review using our clinic review tool, if applicable. No additional management support is needed unless otherwise documented below in the visit note. 

## 2014-06-24 NOTE — Progress Notes (Signed)
   Subjective:    Patient ID: Vincent Wilkerson, male    DOB: 03/03/1956, 58 y.o.   MRN: [id number]  HPI Vincent Wilkerson is a 58 year old male who comes in today with a couple hours history of head congestion  Over the weekend he participated in a [employer] to event and even though he wore a mask he felt like he got a lot of smoke exposure and started having head congestion today. He comes in today requesting Tamiflu thinking he has a virus  Explained to him that he has had congestion from the smoke in the Tamiflu even if he had the flu is not indicated by me.   Review of Systems    review of systems otherwise negative Objective:   Physical Exam  Well-developed well-nourished male no acute distress vital signs stable he is afebrile HEENT were negative neck was supple no adenopathy      Assessment & Plan:  . Head congestion secondary to smoke exposure............ Treat symptomatically with antihistamine and steroid nasal spray

## 2014-07-04 ENCOUNTER — Other Ambulatory Visit (INDEPENDENT_AMBULATORY_CARE_PROVIDER_SITE_OTHER): Payer: BC Managed Care – PPO

## 2014-07-04 DIAGNOSIS — Z0000 Encounter for general adult medical examination without abnormal findings: Secondary | ICD-10-CM

## 2014-07-04 LAB — POCT URINALYSIS DIPSTICK
Bilirubin, UA: NEGATIVE
Glucose, UA: NEGATIVE
Ketones, UA: NEGATIVE
Leukocytes, UA: NEGATIVE
Nitrite, UA: NEGATIVE
PROTEIN UA: NEGATIVE
RBC UA: NEGATIVE
Spec Grav, UA: 1.015
UROBILINOGEN UA: 0.2
pH, UA: 8

## 2014-07-04 LAB — CBC WITH DIFFERENTIAL/PLATELET
Basophils Absolute: 0 10*3/uL (ref 0.0–0.1)
Basophils Relative: 0.5 % (ref 0.0–3.0)
EOS PCT: 2.5 % (ref 0.0–5.0)
Eosinophils Absolute: 0.2 10*3/uL (ref 0.0–0.7)
HEMATOCRIT: 50.5 % (ref 39.0–52.0)
HEMOGLOBIN: 17 g/dL (ref 13.0–17.0)
LYMPHS ABS: 3 10*3/uL (ref 0.7–4.0)
Lymphocytes Relative: 33.1 % (ref 12.0–46.0)
MCHC: 33.7 g/dL (ref 30.0–36.0)
MCV: 93.3 fl (ref 78.0–100.0)
MONO ABS: 0.7 10*3/uL (ref 0.1–1.0)
Monocytes Relative: 7.6 % (ref 3.0–12.0)
NEUTROS ABS: 5.2 10*3/uL (ref 1.4–7.7)
Neutrophils Relative %: 56.3 % (ref 43.0–77.0)
Platelets: 292 10*3/uL (ref 150.0–400.0)
RBC: 5.41 Mil/uL (ref 4.22–5.81)
RDW: 13 % (ref 11.5–15.5)
WBC: 9.2 10*3/uL (ref 4.0–10.5)

## 2014-07-04 LAB — LIPID PANEL
Cholesterol: 203 mg/dL — ABNORMAL HIGH (ref 0–200)
HDL: 52.2 mg/dL (ref >39.00)
LDL Cholesterol: 134 mg/dL — ABNORMAL HIGH (ref 0–99)
NonHDL: 150.8
Total CHOL/HDL Ratio: 4
Triglycerides: 82 mg/dL (ref 0.0–149.0)
VLDL: 16.4 mg/dL (ref 0.0–40.0)

## 2014-07-04 LAB — HEPATIC FUNCTION PANEL
ALT: 36 U/L (ref 0–53)
AST: 30 U/L (ref 0–37)
Albumin: 4.5 g/dL (ref 3.5–5.2)
Alkaline Phosphatase: 77 U/L (ref 39–117)
Bilirubin, Direct: 0.1 mg/dL (ref 0.0–0.3)
Total Bilirubin: 0.7 mg/dL (ref 0.2–1.2)
Total Protein: 6.7 g/dL (ref 6.0–8.3)

## 2014-07-04 LAB — BASIC METABOLIC PANEL
BUN: 15 mg/dL (ref 6–23)
CHLORIDE: 104 meq/L (ref 96–112)
CO2: 32 mEq/L (ref 19–32)
Calcium: 9.8 mg/dL (ref 8.4–10.5)
Creatinine, Ser: 1 mg/dL (ref 0.4–1.5)
GFR: 86.03 mL/min (ref >60.00)
GLUCOSE: 88 mg/dL (ref 70–99)
Potassium: 5 mEq/L (ref 3.5–5.1)
SODIUM: 141 meq/L (ref 135–145)

## 2014-07-04 LAB — TSH: TSH: 1.27 u[IU]/mL (ref 0.35–4.50)

## 2014-07-04 LAB — PSA: PSA: 1.83 ng/mL (ref 0.10–4.00)

## 2014-07-11 ENCOUNTER — Other Ambulatory Visit: Payer: BC Managed Care – PPO

## 2014-07-15 ENCOUNTER — Other Ambulatory Visit: Payer: Self-pay | Admitting: Family Medicine

## 2014-07-17 ENCOUNTER — Encounter: Payer: Self-pay | Admitting: Family Medicine

## 2014-07-17 ENCOUNTER — Ambulatory Visit (INDEPENDENT_AMBULATORY_CARE_PROVIDER_SITE_OTHER): Payer: BC Managed Care – PPO | Admitting: Family Medicine

## 2014-07-17 DIAGNOSIS — E785 Hyperlipidemia, unspecified: Secondary | ICD-10-CM

## 2014-07-17 DIAGNOSIS — G4720 Circadian rhythm sleep disorder, unspecified type: Secondary | ICD-10-CM

## 2014-07-17 DIAGNOSIS — G478 Other sleep disorders: Secondary | ICD-10-CM

## 2014-07-17 DIAGNOSIS — G4733 Obstructive sleep apnea (adult) (pediatric): Secondary | ICD-10-CM

## 2014-07-17 MED ORDER — LAMOTRIGINE 100 MG PO TABS: 100.0000 mg | ORAL_TABLET | Freq: Every day | ORAL | Status: DC

## 2014-07-17 MED ORDER — ARMODAFINIL 150 MG PO TABS: 150.0000 mg | ORAL_TABLET | Freq: Every day | ORAL | Status: DC

## 2014-07-17 MED ORDER — MOMETASONE FUROATE 50 MCG/ACT NA SUSP: 2.0000 | Freq: Every day | NASAL | Status: DC

## 2014-07-17 MED ORDER — SIMVASTATIN 40 MG PO TABS: ORAL_TABLET | ORAL | Status: DC

## 2014-07-17 NOTE — Progress Notes (Signed)
Subjective:    Patient ID: Vincent Wilkerson, male    DOB: Oct 05, 1955, 58 y.o.   MRN: [id number]  HPI Vincent Wilkerson is a 58 year old married male nonsmoker who comes in today for general physical examination  He takes Zocor 40 mg daily for hyperlipidemia  He uses Nasonex steroid when necessary for nasal congestion and allergic rhinitis  He was seeing Dr. Arvil Persons group and has been stable on Nuvigil 150 mg daily and Lamictal 100 mg daily. The therapist he was seeing Gala Murdoch has retired. Since he is clinically stable he would like to know if we would rewrite his medication. I agreed to do this. If he has any questions concerns or needs medicine changes and he needs to go back and see Dr. [name] in her group.  He gets routine eye care, dental care, colonoscopy at age 34 normal, vaccinations up-to-date  He's been going to pulmonary because of sleep apnea. He's on CPAP and Nuvigil 150 mg daily. His pulmonologist declined to refill his Nuvigil which the pulmonologist had originally prescribed. He tells me the pulmonologist told him to get his primary care doctor right individual. I told him I would do it for now but he needs to talk with his pulmonologist at the next visit and explained the pulmonology that they need to write his medication the future since they're the ones who initiated the prescription   Review of Systems  Constitutional: Negative.   HENT: Negative.   Eyes: Negative.   Respiratory: Negative.   Cardiovascular: Negative.   Gastrointestinal: Negative.   Endocrine: Negative.   Genitourinary: Negative.   Musculoskeletal: Negative.   Skin: Negative.   Allergic/Immunologic: Negative.   Neurological: Negative.   Hematological: Negative.   Psychiatric/Behavioral: Negative.        Objective:   Physical Exam  Constitutional: He is oriented to person, place, and time. He appears well-developed and well-nourished.  HENT:  Head: Normocephalic and atraumatic.  Right Ear:  External ear normal.  Left Ear: External ear normal.  Nose: Nose normal.  Mouth/Throat: Oropharynx is clear and moist.  Eyes: Conjunctivae and EOM are normal. Pupils are equal, round, and reactive to light.  Neck: Normal range of motion. Neck supple. No JVD present. No tracheal deviation present. No thyromegaly present.  Cardiovascular: Normal rate, regular rhythm, normal heart sounds and intact distal pulses.  Exam reveals no gallop and no friction rub.   No murmur heard. Pulmonary/Chest: Effort normal and breath sounds normal. No stridor. No respiratory distress. He has no wheezes. He has no rales. He exhibits no tenderness.  Abdominal: Soft. Bowel sounds are normal. He exhibits no distension and no mass. There is no tenderness. There is no rebound and no guarding.  Genitourinary: Rectum normal, prostate normal and penis normal. Guaiac negative stool. No penile tenderness.  Musculoskeletal: Normal range of motion. He exhibits no edema or tenderness.  Lymphadenopathy:    He has no cervical adenopathy.  Neurological: He is alert and oriented to person, place, and time. He has normal reflexes. No cranial nerve deficit. He exhibits normal muscle tone.  Skin: Skin is warm and dry. No rash noted. No erythema. No pallor.  Psychiatric: He has a normal mood and affect. His behavior is normal. Judgment and thought content normal.  Nursing note and vitals reviewed.         Assessment & Plan:  Healthy male  Hyperlipidemia goal continue Zocor  History of depression continue Lamictal  Sleep apnea continue CPAP and Nuvigil 150 mg daily,,,,,,,,  the dilemma here is that his pulmonologist asked [place] to write his medication. I explained to patient since the pulmonologist from the medication and he should be the one to do it. He asked his pulmonologist to do this but the pulmonologist declined and told him to get his primary care doctor to write his medication. I wonder if he doesn't need a new  pulmonologist

## 2014-07-17 NOTE — Patient Instructions (Signed)
Continue the Nuvigil 150 mg daily for your sleep apnea  Continue your CPAP as outlined by pulmonary  At your next couple up visit pulmonary,,,,,,, please asked them to write her medication in the future since they're the ones who initiated medication  Lamictal 100 mg daily,,,,,,,, if your continue to do well then continue that dose however if there is issues questions problems call and make a follow-up appointment with Dr. [name] in her group  Continue Zocor 40 mg daily  Steroid nasal spray when necessary  Return in one year for general physical examination sooner if any problem

## 2014-07-17 NOTE — Progress Notes (Signed)
Pre visit review using our clinic review tool, if applicable. No additional management support is needed unless otherwise documented below in the visit note. 

## 2014-09-29 ENCOUNTER — Telehealth: Payer: Self-pay | Admitting: Pulmonary Disease

## 2014-09-29 NOTE — Telephone Encounter (Signed)
Called and spoke with pt and he stated that it was recs by Dr. Sherren Mocha for the pt to change from [facility] to RA.  Please advise if you are ok with this.  Thanks  No Known Allergies  Current Outpatient Prescriptions on File Prior to Visit  Medication Sig Dispense Refill  . Armodafinil (NUVIGIL) 150 MG tablet Take 1 tablet (150 mg total) by mouth daily. 30 tablet 5  . lamoTRIgine (LAMICTAL) 100 MG tablet Take 1 tablet (100 mg total) by mouth daily. 90 tablet 3  . mometasone (NASONEX) 50 MCG/ACT nasal spray Place 2 sprays into the nose daily. 51 g 3  . simvastatin (ZOCOR) 40 MG tablet TAKE 1 BY MOUTH AT BEDTIME 90 tablet 3   No current facility-administered medications on file prior to visit.

## 2014-09-30 NOTE — Telephone Encounter (Signed)
Pt is already seeing RA for sleep

## 2014-09-30 NOTE — Telephone Encounter (Signed)
Spoke with pt. States that he knows that he already sees RA for sleep. When he was here last, RA advised him that he could get his script for Nuvigil from his PCP. His PCP will no longer prescribe this mediation and states that RA needs to be the one doing this. RA does not have any openings until 11/24/14. Pt can't wait this long.  Could we schedule pt with TP?

## 2014-09-30 NOTE — Telephone Encounter (Signed)
Pt last seen by RA 10.18.13: OSA (obstructive sleep apnea) - Vincent Noel, MD at 06/03/2012 9:25 PM     Status: Vincent Wilkerson Related Problem: OSA (obstructive sleep apnea)   Expand All Collapse All   Moderate OSA, on cpap 9cm, good compliance nuvigil added 4/13 for residual sleepiness inspite of good compliance  Weight loss encouraged, compliance with goal of at least 4-6 hrs every night is the expectation. Advised against medications with sedative side effects Cautioned against driving when sleepy - understanding that sleepiness will vary on a day to day basis  OK to ct nuvigil, hopefully he will not develop tolerance 28m FU while we prescribe this drug - alternatively he can obtain from PCP & keep annual FU with [place]       Discussed with TP Last refill was 12.3.15 by PCP w/ 5 additional refills - what pharmacy did he take this to (rx was printed) Okay to see RA first available appt

## 2014-10-01 NOTE — Telephone Encounter (Signed)
lmtcb X1 for pt  

## 2014-10-02 NOTE — Telephone Encounter (Signed)
Spoke with pt. States that his insurance changed and had to send his prescription to [facility]. They are charging him $125 for a 30 day supply, a 90 day supply will cost only $30 more. He is requesting a new rx so that he won't be charged so much. RA had a cancellation for 11/06/14 at 2:45pm. Appointment has been scheduled.

## 2014-10-08 ENCOUNTER — Encounter: Payer: Self-pay | Admitting: Pulmonary Disease

## 2014-10-08 ENCOUNTER — Ambulatory Visit (INDEPENDENT_AMBULATORY_CARE_PROVIDER_SITE_OTHER): Payer: 59 | Admitting: Pulmonary Disease

## 2014-10-08 VITALS — BP 140/98 | HR 95 | Ht 70.0 in | Wt 230.8 lb

## 2014-10-08 DIAGNOSIS — G4733 Obstructive sleep apnea (adult) (pediatric): Secondary | ICD-10-CM

## 2014-10-08 MED ORDER — ARMODAFINIL 150 MG PO TABS: 150.0000 mg | ORAL_TABLET | Freq: Every day | ORAL | Status: DC

## 2014-10-08 NOTE — Patient Instructions (Signed)
Nuvigil will be renewed x 90 ds x 1 refill CPAP download CPAP supplies will be renewed CPAP compliance with goal of at least 4-6 hrs every night is the expectation.

## 2014-10-08 NOTE — Progress Notes (Signed)
   Subjective:    Patient ID: Vincent Wilkerson, male    DOB: 10/08/1955, 59 y.o.   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 The lowest oxygen desaturation was 85%. For supine sleep the AHI was 53.4 events per hour. During a subsequent study, nasal CPAP was initiated at 4 cm and titrated to 9 cm.  He is maintained on respironics 9 cm , medium mirage mask since   download 8/25- 11/29/09 >>, no residual events on 9 cm, excellent compliance  Download reviewed 1/26-3/26/13 >> no residuals on cpap 9 cm, good usage   10/08/2014  Chief Complaint  Patient presents with  . Follow-up    Patient says that Rockie Neighbours has helped a lot, Dr. Sherren Mocha was prescribing the medication for him for a while, but Dr. Sherren Mocha stopped prescribing the medication and told him he needed to get the medication from Dr. Elsworth Soho. Needs 90 day supply sent to [facility], has new insurance, they require him to have 90 days.  wears CPAP every night, approx 6-7 hours nightly; 11cwp.        3y FU   wears cpap everynight x 6-7 hrs a night. denies any problems w/ mask/machine. is sleeping better.  11/2011 nuvigil added - really helped. he can function better now No chest pain, palpitations Takes nuvigil around 7am - goes to work early, Does not take on weekends He has not renewed supplies x 3y, has  Tape on hose   Review of Systems neg for any significant sore throat, dysphagia, itching, sneezing, nasal congestion or excess/ purulent secretions, fever, chills, sweats, unintended wt loss, pleuritic or exertional cp, hempoptysis, orthopnea pnd or change in chronic leg swelling. Also denies presyncope, palpitations, heartburn, abdominal pain, nausea, vomiting, diarrhea or change in bowel or urinary habits, dysuria,hematuria, rash, arthralgias, visual complaints, headache, numbness weakness  or ataxia.     Objective:   Physical Exam  Gen. Pleasant, obese, in no distress ENT - no lesions, no post nasal drip Neck: No JVD, no thyromegaly, no carotid bruits Lungs: no use of accessory muscles, no dullness to percussion, decreased without rales or rhonchi  Cardiovascular: Rhythm regular, heart sounds  normal, no murmurs or gallops, no peripheral edema Musculoskeletal: No deformities, no cyanosis or clubbing , no tremors       Assessment & Plan:

## 2014-10-08 NOTE — Assessment & Plan Note (Signed)
Nuvigil will be renewed x 90 ds x 1 refill CPAP download CPAP supplies will be renewed CPAP compliance with goal of at least 4-6 hrs every night is the expectation.  Weight loss encouraged, compliance with goal of at least 4-6 hrs every night is the expectation. Advised against medications with sedative side effects Cautioned against driving when sleepy - understanding that sleepiness will vary on a day to day basis

## 2014-12-25 ENCOUNTER — Telehealth: Payer: Self-pay | Admitting: Pulmonary Disease

## 2014-12-25 NOTE — Telephone Encounter (Signed)
Per 10/08/14 OV: Patient Instructions       Vincent Wilkerson will be renewed x 90 ds x 1 refill CPAP download CPAP supplies will be renewed CPAP compliance with goal of at least 4-6 hrs every night is the expectation.   ---  Called spoke with pt. He reports he has a resmed machine. He will come by the office for [place] to get a download off machine next week for [place] to get download off. Will make [facility] aware

## 2015-01-16 ENCOUNTER — Ambulatory Visit (INDEPENDENT_AMBULATORY_CARE_PROVIDER_SITE_OTHER): Payer: 59 | Admitting: Family Medicine

## 2015-01-16 ENCOUNTER — Encounter (INDEPENDENT_AMBULATORY_CARE_PROVIDER_SITE_OTHER): Payer: Self-pay

## 2015-01-16 ENCOUNTER — Encounter: Payer: Self-pay | Admitting: Family Medicine

## 2015-01-16 VITALS — BP 148/91 | HR 76 | Ht 70.0 in | Wt 225.0 lb

## 2015-01-16 DIAGNOSIS — S99921A Unspecified injury of right foot, initial encounter: Secondary | ICD-10-CM | POA: Diagnosis not present

## 2015-01-16 DIAGNOSIS — M25551 Pain in right hip: Secondary | ICD-10-CM | POA: Diagnosis not present

## 2015-01-16 NOTE — Patient Instructions (Signed)
Your foot injury was likely a plantar fascia strain from stepping on the bone. 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 Arch support (like dr. Zoe Lan active series) is very important. Avoid flat shoes, barefoot walking as much as possible. Icing 15 minutes at a time 3-4 times a day. Ibuprofen or aleve as needed for pain.  Your hip pain is compensatory but is a combination of proximal IT band syndrome and piriformis syndrome. Do home exercises (side hip raises, standing hip rotations) 3 sets of 10 once a day. Add ankle weight if these are too easy. Pick 2-3 of the stretches, hold for 20-30 seconds and repeat 3 times once or twice a day. Follow up with me in 5-6 weeks. Physical therapy is an option for these conditions but I doubt you will need these.

## 2015-01-19 ENCOUNTER — Ambulatory Visit: Payer: 59 | Admitting: Family Medicine

## 2015-01-19 DIAGNOSIS — M25551 Pain in right hip: Secondary | ICD-10-CM | POA: Insufficient documentation

## 2015-01-19 DIAGNOSIS — S99921A Unspecified injury of right foot, initial encounter: Secondary | ICD-10-CM | POA: Insufficient documentation

## 2015-01-19 NOTE — Assessment & Plan Note (Signed)
consistent with plantar fascia strain, post tib tendinopathy.  Discussed arch support, icing, nsaids, strengthening exercises.

## 2015-01-19 NOTE — Progress Notes (Signed)
PCP: Joycelyn Man, MD  Subjective:   HPI: Patient is a 59 y.o. male here for right foot injury.  Patient reports about 6 weeks ago he stepped on a dog chew stick with arch of his right foot. Sharp pain here that has persisted though improved some. Radiates up the ankle medially. Has tried icing, aspirin, advil. Now with some right hip pain posterolaterally. No known injury to hip. No numbness/tingling. No bowel/bladder dysfunction.  Past Medical History  Diagnosis Date  . Depression   . Sleep apnea   . Hyperlipidemia   . Pneumonia     Current Outpatient Prescriptions on File Prior to Visit  Medication Sig Dispense Refill  . Armodafinil (NUVIGIL) 150 MG tablet Take 1 tablet (150 mg total) by mouth daily. 90 tablet 1  . mometasone (NASONEX) 50 MCG/ACT nasal spray Place 2 sprays into the nose daily. 51 g 3  . simvastatin (ZOCOR) 40 MG tablet TAKE 1 BY MOUTH AT BEDTIME 90 tablet 3   No current facility-administered medications on file prior to visit.    No past surgical history on file.  No Known Allergies  History   Social History  . Marital Status: Married    Spouse Name: N/A  . Number of Children: N/A  . Years of Education: N/A   Occupational History  . Not on file.   Social History Main Topics  . Smoking status: Never Smoker   . Smokeless tobacco: Not on file  . Alcohol Use: No  . Drug Use: No  . Sexual Activity: Not on file   Other Topics Concern  . Not on file   Social History Narrative    Family History  Problem Relation Age of Onset  . Depression Mother   . Alcohol abuse Mother   . Gallbladder disease Father   . Heart disease Father   . Polycythemia Father   . Alcohol abuse Sister     BP 148/91 mmHg  Pulse 76  Ht 5\' 10"  (1.778 m)  Wt 225 lb (102.059 kg)  BMI 32.28 kg/m2  Review of Systems: See HPI above.    Objective:  Physical Exam:  Gen: NAD  Right foot/ankle: No gross deformity, swelling, ecchymoses FROM without  pain. TTP mildly medial plantar fascia and posterior tibialis tendon course distally. No other tenderness. Negative ant drawer and talar tilt.   Negative syndesmotic compression. Thompsons test negative. NV intact distally.  Right hip: No gross deformity, swelling, bruising. TTP proximal IT band and over piriformis.  No back, other tenderness. FROM. Negative logroll. Negative straight leg raise. Hip abduction 4/5 strength. NVI distally.    Assessment & Plan:  1. Right foot injury - consistent with plantar fascia strain, post tib tendinopathy.  Discussed arch support, icing, nsaids, strengthening exercises.  2. Right hip pain - compensatory pain.  Combination of IT band syndrome and piriformis syndrome.  Shown home exercises and stretches to do daily. F/u in 5-6 weeks.  Consider PT if not improving.

## 2015-01-19 NOTE — Assessment & Plan Note (Signed)
compensatory pain.  Combination of IT band syndrome and piriformis syndrome.  Shown home exercises and stretches to do daily. F/u in 5-6 weeks.  Consider PT if not improving.

## 2015-01-30 ENCOUNTER — Telehealth: Payer: Self-pay | Admitting: Pulmonary Disease

## 2015-01-30 NOTE — Telephone Encounter (Signed)
Patient brought an Music therapist card [employer]) we [employer] those cards here.  Called [facility] and they told me to have patient come to their [employer], Ask for Jonni Sanger and he would download card for patient.    Advised patient of same.  Nothing further needed.

## 2015-02-11 ENCOUNTER — Telehealth: Payer: Self-pay | Admitting: Pulmonary Disease

## 2015-02-11 NOTE — Telephone Encounter (Signed)
CPAP Pressure: 9cm No residuals Good usage

## 2015-02-11 NOTE — Telephone Encounter (Signed)
lmtcb

## 2015-02-12 NOTE — Telephone Encounter (Signed)
lmomtcb x2 for pt 

## 2015-02-13 NOTE — Telephone Encounter (Signed)
Patient says he has no trouble with CPAP Patient just replaced hose without any problems Says he is still having trouble getting download from [facility] Patient notified of results. Nothing further needed.

## 2015-02-19 ENCOUNTER — Encounter: Payer: Self-pay | Admitting: Pulmonary Disease

## 2015-04-08 ENCOUNTER — Ambulatory Visit: Payer: 59 | Admitting: Adult Health

## 2015-04-15 ENCOUNTER — Ambulatory Visit (INDEPENDENT_AMBULATORY_CARE_PROVIDER_SITE_OTHER): Payer: 59 | Admitting: Adult Health

## 2015-04-15 ENCOUNTER — Encounter: Payer: Self-pay | Admitting: Adult Health

## 2015-04-15 ENCOUNTER — Telehealth: Payer: Self-pay | Admitting: Pulmonary Disease

## 2015-04-15 VITALS — BP 122/80 | HR 77 | Temp 98.7°F | Ht 70.0 in | Wt 225.0 lb

## 2015-04-15 DIAGNOSIS — E669 Obesity, unspecified: Secondary | ICD-10-CM | POA: Diagnosis not present

## 2015-04-15 DIAGNOSIS — G4733 Obstructive sleep apnea (adult) (pediatric): Secondary | ICD-10-CM

## 2015-04-15 MED ORDER — ARMODAFINIL 150 MG PO TABS: 150.0000 mg | ORAL_TABLET | Freq: Every day | ORAL | Status: DC

## 2015-04-15 NOTE — Assessment & Plan Note (Signed)
Work on wt loss.  

## 2015-04-15 NOTE — Progress Notes (Signed)
   Subjective:    Patient ID: Vincent Wilkerson, male    DOB: Mar 01, 1956, 59 y.o.   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 The lowest oxygen desaturation was 85%. For supine sleep the AHI was 53.4 events per hour. During a subsequent study, nasal CPAP was initiated at 4 cm and titrated to 9 cm.  He is maintained on respironics 9 cm , medium mirage mask since   download 8/25- 11/29/09 >>, no residual events on 9 cm, excellent compliance  Download reviewed 1/26-3/26/13 >> no residuals on cpap 9 cm, good usage     04/15/2015 Follow up : OSA  Pt returns for 6 month follow up  Says he is doing well on CPAP  Wears 6 hr each night.  Has to take in CPAP chip for download. Not very happy with [facility] , trouble with chip download.  Wears CPAP 6hrs most night  Download in June showed good compliance . Avg usage 6hr . AHI 0.8 . On set pressure at 9cm  He says his machine is old and would like to see if can get new machine Wants to try new mask as feels full mack is too restrictive.  We discussed nasal pillows.  ,will need chin strap as he snores a lot .  Doing well on Nuvigil . Requests refills.  No chest pain, orthopnea or edema.    Review of Systems neg for any significant sore throat, dysphagia, itching, sneezing, nasal congestion or excess/ purulent secretions, fever, chills, sweats, unintended wt loss, pleuritic or exertional cp, hempoptysis, orthopnea pnd or change in chronic leg swelling. Also denies presyncope, palpitations, heartburn, abdominal pain, nausea, vomiting, diarrhea or change in bowel or urinary habits, dysuria,hematuria, rash, arthralgias, visual complaints, headache, numbness weakness or ataxia.     Objective:   Physical Exam  Gen. Pleasant, obese, in no distress ENT - no lesions, no post nasal drip, class 2-3 airway    Neck: No JVD, no thyromegaly, no carotid bruits Lungs: no use of accessory muscles, no dullness to percussion, decreased without rales or rhonchi  Cardiovascular: Rhythm regular, heart sounds  normal, no murmurs or gallops, no peripheral edema Musculoskeletal: No deformities, no cyanosis or clubbing , no tremors       Assessment & Plan:

## 2015-04-15 NOTE — Addendum Note (Signed)
Addended by: Osa Craver on: 04/15/2015 10:34 AM   Modules accepted: Orders

## 2015-04-15 NOTE — Assessment & Plan Note (Signed)
Doing well on CPAP  Encouraged on wt loss.  Will check to see if new machine is option  Nasal pillow trial   Plan  Wear CPAP At bedtime   Goal is at least 6hr each night.  Work on weight loss.  Order sent for nasal pillow w/ chin strap .  Will check to see if eligible for new CPAP machine.  Follow up Dr. Elsworth Wilkerson  In 6 months and  As needed

## 2015-04-15 NOTE — Telephone Encounter (Signed)
6 month reminder already placed  Rx signed [facility]  LMTCB

## 2015-04-15 NOTE — Telephone Encounter (Signed)
Pt was in office on 04/13/15 and requested a refill for Nuvigil  Nugvil 150mg , 1 tab daily with 90 tabs x 1 refill Was refilled last on 10/08/14 by Dr. Elsworth Soho RA please advise if okay to refill

## 2015-04-15 NOTE — Telephone Encounter (Signed)
OK to refill-please ensure that he is seen in the office every 6 months

## 2015-04-15 NOTE — Patient Instructions (Signed)
Keep up good work .  Wear CPAP At bedtime   Goal is at least 6hr each night.  Work on weight loss.  Order sent for nasal pillow w/ chin strap .  Will check to see if eligible for new CPAP machine.  Follow up Dr. Elsworth Soho  In 6 months and  As needed

## 2015-04-16 NOTE — Progress Notes (Signed)
Reviewed & agree with plan  

## 2015-04-16 NOTE — Telephone Encounter (Signed)
Called to speak with pt about new CPAP machine.  LVM for pt to return call.

## 2015-04-17 NOTE — Telephone Encounter (Signed)
Spoke with pt, aware of the below.  In regards to his cpap pt wanted to check the status of his new mask.  I advised that it was ordered on 8/31 and has been sent to [facility].  They will be the ones contacting him to receive the mask.  Pt expressed understanding.  Nothing further needed.

## 2015-04-17 NOTE — Telephone Encounter (Signed)
[phone], pt cb

## 2015-05-22 ENCOUNTER — Telehealth: Payer: Self-pay | Admitting: Pulmonary Disease

## 2015-05-22 DIAGNOSIS — G4733 Obstructive sleep apnea (adult) (pediatric): Secondary | ICD-10-CM

## 2015-05-22 NOTE — Telephone Encounter (Signed)
Ok to order CPAP 9 cm

## 2015-05-22 NOTE — Telephone Encounter (Signed)
Order entered Patient requested nasal pillows and chin strap, put that information into order. Patient notified. Nothing further needed.

## 2015-05-22 NOTE — Telephone Encounter (Signed)
Spoke with pt, states that at his last ov with TP it was noted that we would look into seeing if he's eligible for a new cpap.  Pt has had his current cpap for approx 6 years.  Pt uses [facility].   Called [facility], states that pt has had his machine for over 5 years and is eligible for a new machine. RA are you ok with ordering a new cpap for this pt?  Thanks!

## 2015-06-08 ENCOUNTER — Telehealth: Payer: Self-pay | Admitting: Pulmonary Disease

## 2015-06-08 NOTE — Telephone Encounter (Signed)
Will forward to RA as an FYI 

## 2015-07-30 ENCOUNTER — Encounter: Payer: Self-pay | Admitting: Family Medicine

## 2015-07-30 ENCOUNTER — Ambulatory Visit (INDEPENDENT_AMBULATORY_CARE_PROVIDER_SITE_OTHER): Payer: 59 | Admitting: Family Medicine

## 2015-07-30 VITALS — BP 160/98 | HR 94 | Temp 98.5°F | Ht 70.0 in | Wt 227.9 lb

## 2015-07-30 DIAGNOSIS — J069 Acute upper respiratory infection, unspecified: Secondary | ICD-10-CM | POA: Diagnosis not present

## 2015-07-30 DIAGNOSIS — R030 Elevated blood-pressure reading, without diagnosis of hypertension: Secondary | ICD-10-CM | POA: Diagnosis not present

## 2015-07-30 DIAGNOSIS — L989 Disorder of the skin and subcutaneous tissue, unspecified: Secondary | ICD-10-CM

## 2015-07-30 DIAGNOSIS — IMO0001 Reserved for inherently not codable concepts without codable children: Secondary | ICD-10-CM

## 2015-07-30 NOTE — Progress Notes (Signed)
Pre visit review using our clinic review tool, if applicable. No additional management support is needed unless otherwise documented below in the visit note. 

## 2015-07-30 NOTE — Progress Notes (Signed)
HPI:  URI -started: 2 days ago -symptoms:nasal congestion, sore throat, cough, PNd -denies:fever, SOB, NVD, tooth pain, sinus pain -has tried: nothing -sick contacts/travel/risks: denies flu exposure, tick exposure or or Ebola risks -Hx of: allergies - used to use nasonex but too expensive  Skin lesion: -upper L check -unsure of timing -no pain, itching or hx melanoma  Elevated blood pressure: -on arrival -no  CP, SOB, swelling -denies hx of but on ROC has had borderline diastolic elevation in the past  ROS: See pertinent positives and negatives per HPI.  Past Medical History  Diagnosis Date  . Depression   . Sleep apnea   . Hyperlipidemia   . Pneumonia     No past surgical history on file.  Family History  Problem Relation Age of Onset  . Depression Mother   . Alcohol abuse Mother   . Gallbladder disease Father   . Heart disease Father   . Polycythemia Father   . Alcohol abuse Sister     Social History   Social History  . Marital Status: Married    Spouse Name: N/A  . Number of Children: N/A  . Years of Education: N/A   Social History Main Topics  . Smoking status: Never Smoker   . Smokeless tobacco: None  . Alcohol Use: No  . Drug Use: No  . Sexual Activity: Not Asked   Other Topics Concern  . None   Social History Narrative     Current outpatient prescriptions:  .  Armodafinil (NUVIGIL) 150 MG tablet, Take 1 tablet (150 mg total) by mouth daily., Disp: 90 tablet, Rfl: 1 .  simvastatin (ZOCOR) 40 MG tablet, TAKE 1 BY MOUTH AT BEDTIME, Disp: 90 tablet, Rfl: 3 .  mometasone (NASONEX) 50 MCG/ACT nasal spray, Place 2 sprays into the nose daily. (Patient not taking: Reported on 04/15/2015), Disp: 51 g, Rfl: 3  EXAM:  Filed Vitals:   07/30/15 1309  BP: 138/90  Pulse: 94  Temp: 98.5 F (36.9 C)    Body mass index is 32.7 kg/(m^2).  GENERAL: vitals reviewed and listed above, alert, oriented, appears well hydrated and in no acute  distress  HEENT: atraumatic, conjunttiva clear, no obvious abnormalities on inspection of external nose and ears, normal appearance of ear canals and TMs, clear nasal congestion, mild post oropharyngeal erythema with PND, no tonsillar edema or exudate, no sinus TTP  NECK: no obvious masses on inspection  LUNGS: clear to auscultation bilaterally, no wheezes, rales or rhonchi, good air movement  CV: HRRR, no peripheral edema  MS: moves all extremities without noticeable abnormality  SKIN: SK upper L chest  PSYCH: pleasant and cooperative, no obvious depression or anxiety  ASSESSMENT AND PLAN:  Discussed the following assessment and plan:  Acute upper respiratory infection -given HPI and exam findings today, a serious infection or illness is unlikely. We discussed potential etiologies, with VURI being most likely, and advised supportive care and monitoring. We discussed treatment side effects, likely course, antibiotic misuse, transmission, and signs of developing a serious illness. -of course, we advised to return or notify a doctor immediately if symptoms worsen or persist or new concerns arise.  Skin lesion -explained that given appearance this is most likely an SK - a benign skin lesion. Monitor at physicals.  Elevated BP -recheck after sitting, if remains elevated advise lifestyle changes and recheck when over acute illness.   Patient Instructions  BEFORE YOU LEAVE: -nurse to recheck, blood pressure, if elevated schedule follow up in  1 month with PCP to recheck -flu shot?  INSTRUCTIONS FOR UPPER RESPIRATORY INFECTION:  -plenty of rest and fluids  -nasal saline wash 2-3 times daily (use prepackaged nasal saline or bottled/distilled water if making your own)   -can use AFRIN nasal spray for drainage and nasal congestion - but do NOT use longer then 3-4 days  -can use tylenol (in no history of liver disease) or ibuprofen (if no history of kidney disease, bowel bleeding or  significant heart disease) as directed for aches and sorethroat  -in the winter time, using a humidifier at night is helpful (please follow cleaning instructions)  -if you are taking a cough medication - use only as directed, may also try a teaspoon of honey to coat the throat and throat lozenges. If given a cough medication with codeine or hydrocodone or other narcotic please be advised that this contains a strong and  potentially addicting medication. Please follow instructions carefully, take as little as possible and only use AS NEEDED for severe cough. Discuss potential side effects with your pharmacy. Please do not drive or operate machinery while taking these types of medications. Please do not take other sedating medications, drugs or alcohol while taking this medication without discussing with your doctor.  -for sore throat, salt water gargles can help  -follow up if you have fevers, facial pain, tooth pain, difficulty breathing or are worsening or symptoms persist longer then expected  Upper Respiratory Infection, Adult An upper respiratory infection (URI) is also known as the common cold. It is often caused by a type of germ (virus). Colds are easily spread (contagious). You can pass it to others by kissing, coughing, sneezing, or drinking out of the same glass. Usually, you get better in 1 to 3  weeks.  However, the cough can last for even longer. HOME CARE   Only take medicine as told by your doctor. Follow instructions provided above.  Drink enough water and fluids to keep your pee (urine) clear or pale yellow.  Get plenty of rest.  Return to work when your temperature is < 100 for 24 hours or as told by your doctor. You may use a face mask and wash your hands to stop your cold from spreading. GET HELP RIGHT AWAY IF:   After the first few days, you feel you are getting worse.  You have questions about your medicine.  You have chills, shortness of breath, or red spit  (mucus).  You have pain in the face for more then 1-2 days, especially when you bend forward.  You have a fever, puffy (swollen) neck, pain when you swallow, or white spots in the back of your throat.  You have a bad headache, ear pain, sinus pain, or chest pain.  You have a high-pitched whistling sound when you breathe in and out (wheezing).  You cough up blood.  You have sore muscles or a stiff neck. MAKE SURE YOU:   Understand these instructions.  Will watch your condition.  Will get help right away if you are not doing well or get worse. Document Released: 01/18/2008 Document Revised: 10/24/2011 Document Reviewed: 11/06/2013 [facility] Patient Information 2015 [place], [place]. This information is not intended to replace advice given to you by your health care provider. Make sure you discuss any questions you have with your health care provider.      Colin Benton R.

## 2015-07-30 NOTE — Patient Instructions (Addendum)
BEFORE YOU LEAVE: -nurse to recheck, blood pressure, if elevated schedule follow up in 1 month with PCP to recheck -flu shot?  INSTRUCTIONS FOR UPPER RESPIRATORY INFECTION:  -plenty of rest and fluids  -nasal saline wash 2-3 times daily (use prepackaged nasal saline or bottled/distilled water if making your own)   -can use AFRIN nasal spray for drainage and nasal congestion - but do NOT use longer then 3-4 days  -can use tylenol (in no history of liver disease) or ibuprofen (if no history of kidney disease, bowel bleeding or significant heart disease) as directed for aches and sorethroat  -in the winter time, using a humidifier at night is helpful (please follow cleaning instructions)  -if you are taking a cough medication - use only as directed, may also try a teaspoon of honey to coat the throat and throat lozenges. If given a cough medication with codeine or hydrocodone or other narcotic please be advised that this contains a strong and  potentially addicting medication. Please follow instructions carefully, take as little as possible and only use AS NEEDED for severe cough. Discuss potential side effects with your pharmacy. Please do not drive or operate machinery while taking these types of medications. Please do not take other sedating medications, drugs or alcohol while taking this medication without discussing with your doctor.  -for sore throat, salt water gargles can help  -follow up if you have fevers, facial pain, tooth pain, difficulty breathing or are worsening or symptoms persist longer then expected  Upper Respiratory Infection, Adult An upper respiratory infection (URI) is also known as the common cold. It is often caused by a type of germ (virus). Colds are easily spread (contagious). You can pass it to others by kissing, coughing, sneezing, or drinking out of the same glass. Usually, you get better in 1 to 3  weeks.  However, the cough can last for even longer. HOME CARE     Only take medicine as told by your doctor. Follow instructions provided above.  Drink enough water and fluids to keep your pee (urine) clear or pale yellow.  Get plenty of rest.  Return to work when your temperature is < 100 for 24 hours or as told by your doctor. You may use a face mask and wash your hands to stop your cold from spreading. GET HELP RIGHT AWAY IF:   After the first few days, you feel you are getting worse.  You have questions about your medicine.  You have chills, shortness of breath, or red spit (mucus).  You have pain in the face for more then 1-2 days, especially when you bend forward.  You have a fever, puffy (swollen) neck, pain when you swallow, or white spots in the back of your throat.  You have a bad headache, ear pain, sinus pain, or chest pain.  You have a high-pitched whistling sound when you breathe in and out (wheezing).  You cough up blood.  You have sore muscles or a stiff neck. MAKE SURE YOU:   Understand these instructions.  Will watch your condition.  Will get help right away if you are not doing well or get worse. Document Released: 01/18/2008 Document Revised: 10/24/2011 Document Reviewed: 11/06/2013 [facility] Patient Information 2015 [place], [place]. This information is not intended to replace advice given to you by your health care provider. Make sure you discuss any questions you have with your health care provider.

## 2015-08-22 ENCOUNTER — Other Ambulatory Visit: Payer: Self-pay | Admitting: Family Medicine

## 2015-08-31 ENCOUNTER — Ambulatory Visit (INDEPENDENT_AMBULATORY_CARE_PROVIDER_SITE_OTHER): Payer: 59 | Admitting: Family Medicine

## 2015-08-31 ENCOUNTER — Encounter: Payer: Self-pay | Admitting: Family Medicine

## 2015-08-31 VITALS — BP 160/98 | Temp 98.7°F | Wt 230.0 lb

## 2015-08-31 DIAGNOSIS — I10 Essential (primary) hypertension: Secondary | ICD-10-CM | POA: Insufficient documentation

## 2015-08-31 LAB — CBC WITH DIFFERENTIAL/PLATELET
BASOS ABS: 0.1 10*3/uL (ref 0.0–0.1)
Basophils Relative: 0.6 % (ref 0.0–3.0)
EOS PCT: 2.7 % (ref 0.0–5.0)
Eosinophils Absolute: 0.2 10*3/uL (ref 0.0–0.7)
HCT: 49.4 % (ref 39.0–52.0)
HEMOGLOBIN: 16.9 g/dL (ref 13.0–17.0)
LYMPHS PCT: 29.9 % (ref 12.0–46.0)
Lymphs Abs: 2.6 10*3/uL (ref 0.7–4.0)
MCHC: 34.2 g/dL (ref 30.0–36.0)
MCV: 92.8 fl (ref 78.0–100.0)
MONOS PCT: 6.2 % (ref 3.0–12.0)
Monocytes Absolute: 0.5 10*3/uL (ref 0.1–1.0)
NEUTROS PCT: 60.6 % (ref 43.0–77.0)
Neutro Abs: 5.3 10*3/uL (ref 1.4–7.7)
Platelets: 275 10*3/uL (ref 150.0–400.0)
RBC: 5.32 Mil/uL (ref 4.22–5.81)
RDW: 13 % (ref 11.5–15.5)
WBC: 8.7 10*3/uL (ref 4.0–10.5)

## 2015-08-31 LAB — POCT URINALYSIS DIPSTICK
BILIRUBIN UA: NEGATIVE
Blood, UA: NEGATIVE
Glucose, UA: NEGATIVE
KETONES UA: NEGATIVE
LEUKOCYTES UA: NEGATIVE
NITRITE UA: NEGATIVE
PH UA: 7
PROTEIN UA: NEGATIVE
Spec Grav, UA: 1.01
Urobilinogen, UA: 0.2

## 2015-08-31 LAB — BASIC METABOLIC PANEL
BUN: 16 mg/dL (ref 6–23)
CO2: 26 mEq/L (ref 19–32)
Calcium: 9.8 mg/dL (ref 8.4–10.5)
Chloride: 105 mEq/L (ref 96–112)
Creatinine, Ser: 0.92 mg/dL (ref 0.40–1.50)
GFR: 89.98 mL/min (ref >60.00)
GLUCOSE: 91 mg/dL (ref 70–99)
POTASSIUM: 4.5 meq/L (ref 3.5–5.1)
SODIUM: 144 meq/L (ref 135–145)

## 2015-08-31 LAB — TSH: TSH: 1.04 u[IU]/mL (ref 0.35–4.50)

## 2015-08-31 MED ORDER — LISINOPRIL-HYDROCHLOROTHIAZIDE 10-12.5 MG PO TABS: 1.0000 | ORAL_TABLET | Freq: Every day | ORAL | Status: DC

## 2015-08-31 NOTE — Patient Instructions (Signed)
Complete salt free diet  Walk 30 minutes daily  Omron pump up digital blood pressure cuff..........Marland Kitchen [employer]  Check your blood pressure daily in the morning     Zestoretic.................Marland Kitchen 1 tablet daily in the morning........... Potential side effects her hives........Marland Kitchen If you get hospice stop the medicine call us immediately and cough    return in 3 weeks for follow-up........... Bring a record of all your blood pressure readings and the device

## 2015-08-31 NOTE — Progress Notes (Signed)
Pre visit review using our clinic review tool, if applicable. No additional management support is needed unless otherwise documented below in the visit note. 

## 2015-08-31 NOTE — Progress Notes (Signed)
   Subjective:    Patient ID: LAKELAND LUSKEY, male    DOB: Dec 06, 1955, 60 y.o.   MRN: [id number]  HPI  Ioan is a 60 year old married male nonsmoker who comes in today for a evaluation of elevated blood pressure  His father had hypertension. BP 160/98 today repeat by me 150/100 right arm sitting position   Review of Systems  review of systems negative, except he has sleep apnea. He's having difficulty with the device. Advised him to call his pulmonologist. He's grinding his teeth at night he says    Objective:   Physical Exam  well-developed well-nourished male no acute distress vital signs stable he is afebrile BP right arm sitting position 150/100       Assessment & Plan:   hypertension,,,,,,,,,,, ......... Outlined came plan of exercise diet salt restriction medication monitoring blood pressure and following up in one month

## 2015-09-21 ENCOUNTER — Encounter: Payer: Self-pay | Admitting: Family Medicine

## 2015-09-21 ENCOUNTER — Ambulatory Visit (INDEPENDENT_AMBULATORY_CARE_PROVIDER_SITE_OTHER): Payer: 59 | Admitting: Family Medicine

## 2015-09-21 VITALS — BP 130/80 | Temp 98.4°F | Wt 226.0 lb

## 2015-09-21 DIAGNOSIS — I10 Essential (primary) hypertension: Secondary | ICD-10-CM | POA: Diagnosis not present

## 2015-09-21 LAB — BASIC METABOLIC PANEL
BUN: 22 mg/dL (ref 6–23)
CALCIUM: 10.1 mg/dL (ref 8.4–10.5)
CHLORIDE: 103 meq/L (ref 96–112)
CO2: 29 meq/L (ref 19–32)
CREATININE: 0.93 mg/dL (ref 0.40–1.50)
GFR: 88.85 mL/min (ref >60.00)
Glucose, Bld: 88 mg/dL (ref 70–99)
Potassium: 4.7 mEq/L (ref 3.5–5.1)
Sodium: 140 mEq/L (ref 135–145)

## 2015-09-21 MED ORDER — SIMVASTATIN 40 MG PO TABS: ORAL_TABLET | ORAL | Status: DC

## 2015-09-21 NOTE — Progress Notes (Signed)
Pre visit review using our clinic review tool, if applicable. No additional management support is needed unless otherwise documented below in the visit note. 

## 2015-09-21 NOTE — Progress Notes (Signed)
   Subjective:    Patient ID: Vincent Wilkerson, male    DOB: 1956-07-20, 60 y.o.   MRN: [id number]  HPI Vincent Wilkerson is a 60 year old married male nonsmoker who comes in today for follow-up of hypertension  His BP here today was 140/90. BP at home consistently 130/80. We checked his device and it's accurate.  He's been on Zestoretic 10-12 0.5 for couple weeks no side effects. Initial renal function showed a creatinine of 0.9 B1 a 16 GFR of 89. We'll recheck renal function today   Review of Systems Review of systems otherwise negative    Objective:   Physical Exam  Well-developed well-nourished male no acute distress vital signs stable he is afebrile BP right arm sitting position 140/86      Assessment & Plan:  Normal blood pressure 130/80 at home......... continue current medication.... Monitor BP weekly......Marland Kitchen recheck renal function.... Follow-up in one year sooner if any problems

## 2015-09-21 NOTE — Patient Instructions (Signed)
Continue medication daily  Check your blood pressure weekly........... if you get an elevated reading then check it daily for 2 weeks to see what the trend is. If after 2 weeks your blood pressures all returned to normal then go back and check your blood pressure weekly. If after 2 weeks your blood pressures are all elevated and call make an appointment to see [place] and bring all the data and the device  Return in one year for general physical exam sooner if any problems  Tommi Rumps or Almyra Free are 2 new adult nurse practitioner to Dr. [place]

## 2015-10-07 ENCOUNTER — Telehealth: Payer: Self-pay | Admitting: *Deleted

## 2015-10-07 NOTE — Telephone Encounter (Signed)
Vincent Wilkerson has been approved until 10/04/2016. Pharmacy informed.

## 2015-10-07 NOTE — Telephone Encounter (Signed)
Submitted PA thru [facility]. KeyNino Parsley [id number]  Submitted for review.

## 2015-10-14 ENCOUNTER — Encounter: Payer: Self-pay | Admitting: Pulmonary Disease

## 2015-10-14 ENCOUNTER — Ambulatory Visit (INDEPENDENT_AMBULATORY_CARE_PROVIDER_SITE_OTHER): Payer: 59 | Admitting: Pulmonary Disease

## 2015-10-14 VITALS — BP 124/82 | HR 101 | Ht 70.0 in | Wt 225.2 lb

## 2015-10-14 DIAGNOSIS — G4733 Obstructive sleep apnea (adult) (pediatric): Secondary | ICD-10-CM

## 2015-10-14 DIAGNOSIS — G4710 Hypersomnia, unspecified: Secondary | ICD-10-CM | POA: Insufficient documentation

## 2015-10-14 DIAGNOSIS — G4730 Sleep apnea, unspecified: Secondary | ICD-10-CM | POA: Diagnosis not present

## 2015-10-14 NOTE — Assessment & Plan Note (Signed)
Refills on Nuvigil Off on weekends

## 2015-10-14 NOTE — Progress Notes (Signed)
   Subjective:    Patient ID: Vincent Wilkerson, male    DOB: 1956/02/26, 59 y.o.   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 The lowest oxygen desaturation was 85%. For supine sleep the AHI was 53.4 events per hour. During a subsequent study, nasal CPAP was initiated at 4 cm and titrated to 9 cm.  He is maintained on respironics 9 cm , medium mirage mask since    10/14/2015  Chief Complaint  Patient presents with  . Follow-up    doing well on CPAP. nasal pillows work much better.  Grinding teeth a lot more on generic dose of Nuvigil.  discuss what dose he should take of Melatonin.   08/2015 New CPAP provided 33m FU Works for [employer] - long hours nuvigil works, c/o sleepiness on occasion No leak, nasal pillows ok, no dryness No cardiac issues, no palpitations  Download 09/2015 >> on 9 cm, no residuals, no elak, good usage      Review of Systems Patient denies significant dyspnea,cough, hemoptysis,  chest pain, palpitations, pedal edema, orthopnea, paroxysmal nocturnal dyspnea, lightheadedness, nausea, vomiting, abdominal or  leg pains      Objective:   Physical Exam  Gen. Pleasant, well-nourished, in no distress ENT - no lesions, no post nasal drip Neck: No JVD, no thyromegaly, no carotid bruits Lungs: no use of accessory muscles, no dullness to percussion, clear without rales or rhonchi  Cardiovascular: Rhythm regular, heart sounds  normal, no murmurs or gallops, no peripheral edema Musculoskeletal: No deformities, no cyanosis or clubbing        Assessment & Plan:

## 2015-10-14 NOTE — Assessment & Plan Note (Signed)
CPAP is set at 9 cm Supplies will be renewed x 1 year   Weight loss encouraged, compliance with goal of at least 4-6 hrs every night is the expectation. Advised against medications with sedative side effects Cautioned against driving when sleepy - understanding that sleepiness will vary on a day to day basis

## 2015-10-14 NOTE — Patient Instructions (Signed)
Your CPAP is set at 9 cm Refils on Nuvigil

## 2015-12-14 ENCOUNTER — Encounter: Payer: Self-pay | Admitting: Family Medicine

## 2015-12-14 ENCOUNTER — Ambulatory Visit (INDEPENDENT_AMBULATORY_CARE_PROVIDER_SITE_OTHER): Payer: 59 | Admitting: Family Medicine

## 2015-12-14 VITALS — BP 122/72 | HR 80 | Temp 98.6°F | Wt 225.0 lb

## 2015-12-14 DIAGNOSIS — L089 Local infection of the skin and subcutaneous tissue, unspecified: Secondary | ICD-10-CM

## 2015-12-14 DIAGNOSIS — I10 Essential (primary) hypertension: Secondary | ICD-10-CM

## 2015-12-14 DIAGNOSIS — L918 Other hypertrophic disorders of the skin: Secondary | ICD-10-CM

## 2015-12-14 DIAGNOSIS — Z23 Encounter for immunization: Secondary | ICD-10-CM | POA: Diagnosis not present

## 2015-12-14 DIAGNOSIS — E669 Obesity, unspecified: Secondary | ICD-10-CM

## 2015-12-14 DIAGNOSIS — M25552 Pain in left hip: Secondary | ICD-10-CM

## 2015-12-14 DIAGNOSIS — M25551 Pain in right hip: Secondary | ICD-10-CM

## 2015-12-14 DIAGNOSIS — E785 Hyperlipidemia, unspecified: Secondary | ICD-10-CM

## 2015-12-14 DIAGNOSIS — D485 Neoplasm of uncertain behavior of skin: Secondary | ICD-10-CM | POA: Diagnosis not present

## 2015-12-14 NOTE — Assessment & Plan Note (Addendum)
S: poorly controlled on last lipids- unclear if was on statin at times. No myalgias.  Lab Results  Component Value Date   CHOL 203* 07/04/2014   HDL 52.20 07/04/2014   LDLCALC 134* 07/04/2014   LDLDIRECT 127.1 04/10/2012   TRIG 82.0 07/04/2014   CHOLHDL 4 07/04/2014   A/P: will update lipids at CPE, continue statin for now

## 2015-12-14 NOTE — Progress Notes (Signed)
Phone: [phone]  Subjective:  Patient presents today to establish care with me as their new primary care provider. Patient was formerly a patient of Dr. Sherren Mocha. Chief complaint-noted.   See problem oriented charting ROS- No chest pain or shortness of breath. No headache or blurry vision. Does have some lateral hip pain but not groin pain   The following were reviewed and entered/updated in epic: Past Medical History  Diagnosis Date  . Depression   . Sleep apnea   . Hyperlipidemia   . Pneumonia    Patient Active Problem List   Diagnosis Date Noted  . Essential hypertension 08/31/2015    Priority: Medium  . Hyperlipidemia 05/11/2007    Priority: Medium  . OSA (obstructive sleep apnea) 05/11/2007    Priority: Medium  . Hypersomnia with sleep apnea 10/14/2015    Priority: Low  . Obese 04/15/2015    Priority: Low  . Right hip pain 01/19/2015    Priority: Low  . History of basal cell cancer 05/10/2010    Priority: Low   Past Surgical History  Procedure Laterality Date  . None      Family History  Problem Relation Age of Onset  . Depression Mother   . Alcohol abuse Mother   . Gallbladder disease Father   . Heart disease Father     CHF- died from this  . Polycythemia Father   . Alcohol abuse Sister     Medications- reviewed and updated Current Outpatient Prescriptions  Medication Sig Dispense Refill  . Armodafinil (NUVIGIL) 150 MG tablet Take 1 tablet (150 mg total) by mouth daily. 90 tablet 1  . lisinopril-hydrochlorothiazide (PRINZIDE,ZESTORETIC) 10-12.5 MG tablet Take 1 tablet by mouth daily. 90 tablet 3  . MELATONIN PO Take by mouth. Pt not sure of dose of Melatonin he is taking    . simvastatin (ZOCOR) 40 MG tablet Take 1 tablet by mouth at  bedtime 90 tablet 3   No current facility-administered medications for this visit.    Allergies-reviewed and updated No Known Allergies  Social History   Social History  . Marital Status: Married    Spouse  Name: N/A  . Number of Children: N/A  . Years of Education: N/A   Social History Main Topics  . Smoking status: Never Smoker   . Smokeless tobacco: Not on file  . Alcohol Use: 0.0 oz/week    0 Standard drinks or equivalent per week     Comment: sparing glass of wine or mixed drink  . Drug Use: No  . Sexual Activity: Not on file   Other Topics Concern  . Not on file   Social History Narrative   Married 2001. 2 stepchildren. Both children graduating by 2017 from college.  1 dog- lab collie mix.       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 Oropharynx normal Neck: no thyromegaly CV: RRR no murmurs rubs or gallops Lungs: CTAB no crackles, wheeze, rhonchi Abdomen: soft/nontender/nondistended/normal bowel sounds. No rebound or guarding.  Ext: no edema Skin: warm, dry, slightly raised brown lesion just beyond beardline. Skin tag on left upper leg.  Neuro: grossly normal, moves all extremities, PERRLA  Hip: ROM IR: 80 Deg, ER: 80 Deg, Flexion: 120 Deg, Extension: 100 Deg,  Abduction: 45 Deg, Adduction: 45 Deg Strength IR: 5/5, ER: 5/5, Flexion: 5/5, Extension: 5/5, Abduction: 5/5, Adduction: 5/5 Pelvic alignment unremarkable to inspection and palpation. Greater trochanter without tenderness to palpation. No tenderness over piriformis and greater trochanter. Patient does have pain with stretching IT band laterally in hip as well as pain with FABER but in his outer leg- muscles very tight   Assessment/Plan:  Right face lesion -states has been changing and growing lately and patient has History of basal cell cancer. Does not look like basal cell but with  Neoplasm of uncertain behavior of skin - Plan: Ambulatory referral to Dermatology  Bilateral hip pain-  S: 11  months of pain after starting with right hip only after plantar fascia issue. Now progressed to left hip. Pain up to 4/10. Worse when he tries to stretch it. Seems to bother him when laying down more than anything, not particularly worse with activity. No history of hip arthritis A/P: discussed this was likely MSK tightness. No obvious arthritis on exam. Encouraged patient on doing FABER to stretch as well as doing IT band stretch. If not better, consider PT.   Essential hypertension S: controlled on lisinopril-hctz 10-12.5 mg BP Readings from Last 3 Encounters:  12/14/15 122/72  10/14/15 124/82  09/21/15 130/80  A/P:Continue current meds:  No change   Hyperlipidemia S: poorly controlled on last lipids- unclear if was on statin at times. No myalgias.  Lab Results  Component Value Date   CHOL 203* 07/04/2014   HDL 52.20 07/04/2014   LDLCALC 134* 07/04/2014   LDLDIRECT 127.1 04/10/2012   TRIG 82.0 07/04/2014   CHOLHDL 4 07/04/2014   A/P: will update lipids at CPE, continue statin for now    Obese Goal 200, peak 235, walking new dog has helped. Down about 10 lbs- continuing journey- encouraged patient  Inflamed skin tag on left upper leg S: patient has a skin tag on his left upper leg that rubs every time he puts his underwear on. It is painful at times when it gets caught. In addition, at times can get slightly red and inflamed, though not inflamed today A/P: Patient requests removal due to recurrent inflammation and irritation. Removal Completed today. 0.5 cc of lidocaine with epinephrine used to anesthetize. Then betadine used to further cleanse before using pick ups to pick up lesion and scissors to cut the base. Hemostasis achieved with silver nitrate. Patient tolerated procedure well. Verbal consent before visit and post procedure aftercare was discussed.   we also discussed stressors in his life and how those are barriers for his goals.   Next available CPE Return precautions  advised.   Orders Placed This Encounter  Procedures  . Tdap vaccine greater than or equal to 7yo IM  . Ambulatory referral to Dermatology    Referral Priority:  Routine    Referral Type:  Consultation    Referral Reason:  Specialty Services Required    Requested Specialty:  Dermatology    Number of Visits Requested:  1   The duration of face-to-face time during this visit was 30 minutes. Greater than 50% of this time was spent in counseling, explanation of diagnosis, planning of further management, and/or coordination of care.     Garret Reddish, MD

## 2015-12-14 NOTE — Assessment & Plan Note (Signed)
S: controlled on lisinopril-hctz 10-12.5 mg BP Readings from Last 3 Encounters:  12/14/15 122/72  10/14/15 124/82  09/21/15 130/80  A/P:Continue current meds:  No change

## 2015-12-14 NOTE — Patient Instructions (Addendum)
We will call you within a week about your referral to dermatology. If you do not hear within 2 weeks, give [place] a call.   Tdap before you leave  Health Maintenance Due  Topic Date Due  . Hepatitis C Screening - ask them to add this to physical labs [birth year]  . HIV Screening- ask them to add this to physical labs  03/18/1973  . TETANUS/TDAP - today 08/31/2015   Skin tag removed today due to irritation with underwear  Keep up the great job walking and with weight loss  Schedule next available physical at check out at least a month out from today. Come by for fasting labs a few days before hand

## 2015-12-14 NOTE — Assessment & Plan Note (Signed)
Goal 200, peak 235, walking new dog has helped. Down about 10 lbs- continuing journey- encouraged patient

## 2016-02-12 ENCOUNTER — Telehealth: Payer: Self-pay | Admitting: Pulmonary Disease

## 2016-02-12 NOTE — Telephone Encounter (Signed)
Spoke with pt and he would like refill on Nuvigil sent to [facility] for 90D supply.   RA please advise if ok to refill. Thanks!

## 2016-02-13 NOTE — Telephone Encounter (Signed)
Okay to refill? 

## 2016-02-15 MED ORDER — ARMODAFINIL 150 MG PO TABS
150.0000 mg | ORAL_TABLET | Freq: Every day | ORAL | Status: DC
Start: 2016-02-15 — End: 2016-09-27

## 2016-02-15 NOTE — Telephone Encounter (Signed)
Rx printed and placed on Dr. Bari Mantis desk for him to sign when he comes into the office today. Will fax Rx to [facility].

## 2016-02-17 NOTE — Telephone Encounter (Signed)
Rx is on Dr. Bari Mantis desk to be signed. Will fax when signed I will close encounter when completed.

## 2016-02-17 NOTE — Telephone Encounter (Signed)
Sharyn Lull please advise if rx was signed and faxed in. Thanks.

## 2016-02-18 NOTE — Telephone Encounter (Signed)
Rx has been signed and faxed to [facility]. Nothing further needed.

## 2016-02-29 ENCOUNTER — Other Ambulatory Visit (INDEPENDENT_AMBULATORY_CARE_PROVIDER_SITE_OTHER): Payer: 59

## 2016-02-29 DIAGNOSIS — Z0000 Encounter for general adult medical examination without abnormal findings: Secondary | ICD-10-CM | POA: Diagnosis not present

## 2016-02-29 LAB — BASIC METABOLIC PANEL
BUN: 18 mg/dL (ref 6–23)
CHLORIDE: 104 meq/L (ref 96–112)
CO2: 29 mEq/L (ref 19–32)
Calcium: 9.4 mg/dL (ref 8.4–10.5)
Creatinine, Ser: 0.9 mg/dL (ref 0.40–1.50)
GFR: 92.13 mL/min (ref >60.00)
GLUCOSE: 77 mg/dL (ref 70–99)
POTASSIUM: 4.1 meq/L (ref 3.5–5.1)
SODIUM: 141 meq/L (ref 135–145)

## 2016-02-29 LAB — POC URINALSYSI DIPSTICK (AUTOMATED)
Bilirubin, UA: NEGATIVE
Glucose, UA: NEGATIVE
Ketones, UA: NEGATIVE
LEUKOCYTES UA: NEGATIVE
NITRITE UA: NEGATIVE
PROTEIN UA: NEGATIVE
RBC UA: NEGATIVE
Spec Grav, UA: 1.02
Urobilinogen, UA: 0.2
pH, UA: 7

## 2016-02-29 LAB — HEPATIC FUNCTION PANEL
ALT: 22 U/L (ref 0–53)
AST: 21 U/L (ref 0–37)
Albumin: 4.1 g/dL (ref 3.5–5.2)
Alkaline Phosphatase: 57 U/L (ref 39–117)
BILIRUBIN TOTAL: 0.6 mg/dL (ref 0.2–1.2)
Bilirubin, Direct: 0.1 mg/dL (ref 0.0–0.3)
Total Protein: 6.2 g/dL (ref 6.0–8.3)

## 2016-02-29 LAB — TSH: TSH: 1.23 u[IU]/mL (ref 0.35–4.50)

## 2016-02-29 LAB — LIPID PANEL
CHOLESTEROL: 171 mg/dL (ref 0–200)
HDL: 47.1 mg/dL (ref >39.00)
LDL CALC: 98 mg/dL (ref 0–99)
NonHDL: 123.52
Total CHOL/HDL Ratio: 4
Triglycerides: 128 mg/dL (ref 0.0–149.0)
VLDL: 25.6 mg/dL (ref 0.0–40.0)

## 2016-02-29 LAB — PSA: PSA: 1.6 ng/mL (ref 0.10–4.00)

## 2016-03-01 LAB — CBC WITH DIFFERENTIAL/PLATELET
BASOS ABS: 0.1 10*3/uL (ref 0.0–0.1)
Basophils Relative: 0.6 % (ref 0.0–3.0)
EOS ABS: 0.3 10*3/uL (ref 0.0–0.7)
EOS PCT: 3.1 % (ref 0.0–5.0)
HCT: 45.8 % (ref 39.0–52.0)
Hemoglobin: 15.6 g/dL (ref 13.0–17.0)
LYMPHS ABS: 2.7 10*3/uL (ref 0.7–4.0)
Lymphocytes Relative: 31.9 % (ref 12.0–46.0)
MCHC: 34.1 g/dL (ref 30.0–36.0)
MCV: 92.8 fl (ref 78.0–100.0)
MONO ABS: 0.4 10*3/uL (ref 0.1–1.0)
Monocytes Relative: 5.2 % (ref 3.0–12.0)
NEUTROS PCT: 59.2 % (ref 43.0–77.0)
Neutro Abs: 5 10*3/uL (ref 1.4–7.7)
Platelets: 245 10*3/uL (ref 150.0–400.0)
RBC: 4.93 Mil/uL (ref 4.22–5.81)
RDW: 13.8 % (ref 11.5–15.5)
WBC: 8.5 10*3/uL (ref 4.0–10.5)

## 2016-03-04 ENCOUNTER — Encounter: Payer: Self-pay | Admitting: Family Medicine

## 2016-03-04 ENCOUNTER — Ambulatory Visit (INDEPENDENT_AMBULATORY_CARE_PROVIDER_SITE_OTHER): Payer: 59 | Admitting: Family Medicine

## 2016-03-04 VITALS — BP 120/80 | HR 83 | Temp 97.8°F | Ht 69.0 in | Wt 217.0 lb

## 2016-03-04 DIAGNOSIS — Z0000 Encounter for general adult medical examination without abnormal findings: Secondary | ICD-10-CM

## 2016-03-04 NOTE — Progress Notes (Signed)
Phone: [phone]  Subjective:  Patient presents today for their annual physical. Chief complaint-noted.   See problem oriented charting- ROS- full  review of systems was completed and negative including No chest pain or shortness of breath. No headache or blurry vision.   The following were reviewed and entered/updated in epic: Past Medical History  Diagnosis Date  . Depression   . Sleep apnea   . Hyperlipidemia   . Pneumonia    Patient Active Problem List   Diagnosis Date Noted  . Essential hypertension 08/31/2015    Priority: Medium  . Hyperlipidemia 05/11/2007    Priority: Medium  . OSA (obstructive sleep apnea) 05/11/2007    Priority: Medium  . Hypersomnia with sleep apnea 10/14/2015    Priority: Low  . Obese 04/15/2015    Priority: Low  . Right hip pain 01/19/2015    Priority: Low  . History of basal cell cancer 05/10/2010    Priority: Low   Past Surgical History  Procedure Laterality Date  . None      Family History  Problem Relation Age of Onset  . Depression Mother   . Alcohol abuse Mother   . Gallbladder disease Father   . Heart disease Father     CHF- died from this  . Polycythemia Father   . Alcohol abuse Sister     Medications- reviewed and updated Current Outpatient Prescriptions  Medication Sig Dispense Refill  . Armodafinil (NUVIGIL) 150 MG tablet Take 1 tablet (150 mg total) by mouth daily. 90 tablet 1  . lisinopril-hydrochlorothiazide (PRINZIDE,ZESTORETIC) 10-12.5 MG tablet Take 1 tablet by mouth daily. 90 tablet 3  . MELATONIN PO Take by mouth. Pt not sure of dose of Melatonin he is taking    . simvastatin (ZOCOR) 40 MG tablet Take 1 tablet by mouth at  bedtime 90 tablet 3   Allergies-reviewed and updated No Known Allergies  Social History   Social History  . Marital Status: Married    Spouse Name: N/A  . Number of Children: N/A  . Years of Education: N/A   Social History Main Topics  . Smoking status: Never Smoker   .  Smokeless tobacco: Not on file  . Alcohol Use: 0.0 oz/week    0 Standard drinks or equivalent per week     Comment: sparing glass of wine or mixed drink  . Drug Use: No  . Sexual Activity: Not on file   Other Topics Concern  . Not on file   Social History Narrative   Married 2001. 2 stepchildren. Both children graduating by 2017 from college.  1 dog- lab collie mix.       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 Oropharynx normal Neck: no thyromegaly CV: RRR no murmurs rubs or gallops Lungs: CTAB no crackles, wheeze, rhonchi Abdomen: soft/nontender/nondistended/normal bowel sounds. No rebound or guarding.  Ext: no edema Skin: warm, dry Neuro: grossly normal, moves all extremities, PERRLA  Rectal: normal tone, mildly enlarged prostate, no masses or tenderness  Assessment/Plan:  60 y.o. male presenting for annual physical.  Health Maintenance counseling: 1. Anticipatory guidance: Patient counseled regarding regular dental exams, eye exams, wearing seatbelts.  2.  Risk factor reduction:  Advised patient of need for regular exercise (great job with walking dog) and diet rich and fruits and vegetables to reduce risk of heart attack and stroke. Weight down 13 lbs- fantastic work! Fit bit helping 3. Immunizations/screenings/ancillary studies Immunization History  Administered Date(s) Administered  . Influenza Split 04/26/2012  . Influenza Whole 04/09/2010  . Influenza,inj,Quad PF,36+ Mos 06/24/2014  . Influenza-Unspecified 05/16/2015  . Td 08/30/2005  . Tdap 12/14/2015   Health Maintenance Due  Topic Date Due  . Hepatitis C Screening - declined 01-07-1956  . HIV Screening - declined 03/18/1973   4.  Prostate cancer screening- low risk based off rectal exam and PSA- does have BPH . States peeing less at night- 0-1x a night previously 2-3. Cut down on coffee Lab Results  Component Value Date   PSA 1.60 02/29/2016   PSA 1.83 07/04/2014   PSA 1.82 04/22/2013   5. Colon cancer screening - 02/09/2009 with 10 year follow up 6. Skin cancer screening- history of basal cell, does not see dermatology, no new lesions today. Had referred previously- he is going to reach out  Status of chronic or acute concerns  OSA_ using CPAP. On nuvigil through Dr. Elsworth Soho  HLD- great control on simvastatin 40mg   HTN- controlled on lisinopril hct 10-12.5mg   Patient is doing incredibly well with 13 lbs weight loss- all #s improving. Continue current efforts with target under 200 long term weight    No problem-specific assessment & plan notes found for this encounter.   No Follow-up on file.  No orders of the defined types were placed in this encounter.    No orders of the defined types were placed in this encounter.    Return precautions advised.   Garret Reddish, MD

## 2016-03-04 NOTE — Patient Instructions (Addendum)
Your exercise and weight loss has made a great impact on your health- all #s trending right direction.   Keep up the good work- would love to see you hang out under 200 long run.   No changes in medication  Happy to see you sooner than a year, but if you want to do a year- try to keep an eye on blood pressure at least once a month

## 2016-03-04 NOTE — Progress Notes (Signed)
Pre visit review using our clinic review tool, if applicable. No additional management support is needed unless otherwise documented below in the visit note. 

## 2016-04-15 ENCOUNTER — Ambulatory Visit (INDEPENDENT_AMBULATORY_CARE_PROVIDER_SITE_OTHER): Payer: 59 | Admitting: Adult Health

## 2016-04-15 ENCOUNTER — Encounter: Payer: Self-pay | Admitting: Adult Health

## 2016-04-15 DIAGNOSIS — G4733 Obstructive sleep apnea (adult) (pediatric): Secondary | ICD-10-CM

## 2016-04-15 DIAGNOSIS — G4730 Sleep apnea, unspecified: Secondary | ICD-10-CM | POA: Diagnosis not present

## 2016-04-15 DIAGNOSIS — G4710 Hypersomnia, unspecified: Secondary | ICD-10-CM

## 2016-04-15 NOTE — Assessment & Plan Note (Signed)
Doing well on CPAP   Plan  Patient Instructions  Continue on C Pap at bedtime Do not drive a sleepy Continue on Nuvigil daily Follow with Dr. Elsworth Soho in 6 months and As needed

## 2016-04-15 NOTE — Assessment & Plan Note (Signed)
Doing well on Nuvigil   Plan  Patient Instructions  Continue on C Pap at bedtime Do not drive a sleepy Continue on Nuvigil daily Follow with Dr. Elsworth Soho in 6 months and As needed

## 2016-04-15 NOTE — Addendum Note (Signed)
Addended by: Osa Craver on: 04/15/2016 09:58 AM   Modules accepted: Orders

## 2016-04-15 NOTE — Patient Instructions (Signed)
Continue on C Pap at bedtime Do not drive a sleepy Continue on Nuvigil daily Follow with Dr. Elsworth Soho in 6 months and As needed

## 2016-04-15 NOTE — Progress Notes (Signed)
Subjective:    Patient ID: Vincent Wilkerson, male    DOB: 28-Jan-1956, 60 y.o.   MRN: [id number]  HPI 60 year old male followed for moderate sleep apnea and hypersomnia   TEST  PSG - [place], [place] in 08/2001- 220 pounds- (RDI) was 21 events per hour with an AHI of 14.9 events per hour. The lowest oxygen desaturation was 85%. For supine sleep the AHI was 53.4 events per hour. During a subsequent study, nasal CPAP was initiated at 4 cm and titrated to 9 cm.  He is maintained on respironics 9 cm , medium mirage mask since    04/15/2016 follow-up sleep apnea Patient presents for a 6 month follow-up for sleep apnea.  Patient remains on C Pap at bedtime. Patient says he's doing well on C Pap. Patient does have daytime sleepiness despite C Pap and is on Nuvigil. Says it works well for him. No sign sleepiness.  Unable to get CPAP Download . Program was down.   He denies any chest pain, orthopnea, PND, palpitations, or orthopnea.   Past Medical History:  Diagnosis Date  . Depression   . Hyperlipidemia   . Pneumonia   . Sleep apnea    Current Outpatient Prescriptions on File Prior to Visit  Medication Sig Dispense Refill  . Armodafinil (NUVIGIL) 150 MG tablet Take 1 tablet (150 mg total) by mouth daily. 90 tablet 1  . aspirin EC 81 MG tablet Take 81 mg by mouth daily.    Marland Kitchen lisinopril-hydrochlorothiazide (PRINZIDE,ZESTORETIC) 10-12.5 MG tablet Take 1 tablet by mouth daily. 90 tablet 3  . MELATONIN PO Take by mouth. Pt not sure of dose of Melatonin he is taking    . simvastatin (ZOCOR) 40 MG tablet Take 1 tablet by mouth at  bedtime 90 tablet 3   No current facility-administered medications on file prior to visit.       Review of Systems Constitutional:   No  weight loss, night sweats,  Fevers, chills, fatigue, or  lassitude.  HEENT:   No headaches,  Difficulty swallowing,  Tooth/dental problems, or  Sore throat,                No sneezing, itching, ear ache, nasal congestion, post nasal  drip,   CV:  No chest pain,  Orthopnea, PND, swelling in lower extremities, anasarca, dizziness, palpitations, syncope.   GI  No heartburn, indigestion, abdominal pain, nausea, vomiting, diarrhea, change in bowel habits, loss of appetite, bloody stools.   Resp: No shortness of breath with exertion or at rest.  No excess mucus, no productive cough,  No non-productive cough,  No coughing up of blood.  No change in color of mucus.  No wheezing.  No chest wall deformity  Skin: no rash or lesions.  GU: no dysuria, change in color of urine, no urgency or frequency.  No flank pain, no hematuria   MS:  No joint pain or swelling.  No decreased range of motion.  No back pain.  Psych:  No change in mood or affect. No depression or anxiety.  No memory loss.         Objective:   Physical Exam Vitals:   04/15/16 0904  BP: 120/76  Pulse: 77  Temp: 97.9 F (36.6 C)  TempSrc: Oral  SpO2: 97%  Weight: 217 lb (98.4 kg)  Height: 5\' 10"  (1.778 m)  Body mass index is 31.14 kg/m.   GEN: A/Ox3; pleasant , NAD, well nourished    HEENT:  [place]/AT,  EACs-clear, TMs-wnl, NOSE-clear, THROAT-clear, no lesions, no postnasal drip or exudate noted. Class 2-3 MP airway   NECK:  Supple w/ fair ROM; no JVD; normal carotid impulses w/o bruits; no thyromegaly or nodules palpated; no lymphadenopathy.    RESP  Clear  P & A; w/o, wheezes/ rales/ or rhonchi. no accessory muscle use, no dullness to percussion  CARD:  RRR, no m/r/g  , no peripheral edema, pulses intact, no cyanosis or clubbing.  GI:   Soft & nt; nml bowel sounds; no organomegaly or masses detected.   Musco: Warm bil, no deformities or joint swelling noted.   Neuro: alert, no focal deficits noted.    Skin: Warm, no lesions or rashes  Mayme Profeta NP-C  [facility]  04/15/2016

## 2016-04-30 NOTE — Progress Notes (Signed)
Reviewed & agree with plan  

## 2016-08-10 ENCOUNTER — Other Ambulatory Visit: Payer: Self-pay | Admitting: Family Medicine

## 2016-09-27 ENCOUNTER — Encounter: Payer: Self-pay | Admitting: Pulmonary Disease

## 2016-09-27 ENCOUNTER — Ambulatory Visit (INDEPENDENT_AMBULATORY_CARE_PROVIDER_SITE_OTHER): Payer: 59 | Admitting: Pulmonary Disease

## 2016-09-27 DIAGNOSIS — G4733 Obstructive sleep apnea (adult) (pediatric): Secondary | ICD-10-CM | POA: Diagnosis not present

## 2016-09-27 DIAGNOSIS — G4730 Sleep apnea, unspecified: Secondary | ICD-10-CM | POA: Diagnosis not present

## 2016-09-27 DIAGNOSIS — G4710 Hypersomnia, unspecified: Secondary | ICD-10-CM | POA: Diagnosis not present

## 2016-09-27 MED ORDER — ARMODAFINIL 150 MG PO TABS: 150.0000 mg | ORAL_TABLET | Freq: Every day | ORAL | 2 refills | Status: DC

## 2016-09-27 NOTE — Assessment & Plan Note (Signed)
Refills on Nuvigil will be given for 9 months- continue  drug holiday on the weekends

## 2016-09-27 NOTE — Assessment & Plan Note (Signed)
[facility] and we will review report from your CPAP   Weight loss encouraged, compliance with goal of at least 4-6 hrs every night is the expectation. Advised against medications with sedative side effects Cautioned against driving when sleepy - understanding that sleepiness will vary on a day to day basis

## 2016-09-27 NOTE — Progress Notes (Signed)
   Subjective:    Patient ID: SMITH MATTIA, male    DOB: [birth year], 61 y.o.   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 Follow-up    for OSA.    He obtained a new CPAP and 08/2015 and this seems to be working well for him. He denies any problems with nasal pillows or leak or pressure. He wakes up feeling rested except recently when his wife has had a cough. Bedtime is around 8 PM and wake up time is around 4 AM. He takes Nuvigil on work days, on weekends he will often nap  I'm unable to review her download today because his card does not seem to be working. Last download was reviewed in 09/2015  His blood pressure is well controlled on less notable. He denies palpitations or problems with Leda Roys 09/2015 >> on 9 cm, no residuals, no leak, good usage   Significant tests/ events  PSG - [place], [place] in 08/2001- 220 pounds- (RDI) was 21 events per hour with an AHI of 14.9 events per hour. The lowest oxygen desaturation was 85%. For supine sleep the AHI was 53.4 events per hour. During a subsequent study, nasal CPAP was initiated at 4 cm and titrated to 9 cm.  He is maintained on respironics 9 cm , medium mirage mask since  Review of Systems Patient denies significant dyspnea,cough, hemoptysis,  chest pain, palpitations, pedal edema, orthopnea, paroxysmal nocturnal dyspnea, lightheadedness, nausea, vomiting, abdominal or  leg pains      Objective:   Physical Exam  Gen. Pleasant, well-nourished, in no distress ENT - no lesions, no post nasal drip Neck: No JVD, no thyromegaly, no carotid bruits Lungs: no use of accessory muscles, no dullness to percussion, clear without rales or rhonchi  Cardiovascular: Rhythm regular, heart sounds  normal, no murmurs or gallops, no peripheral edema Musculoskeletal: No deformities, no cyanosis or clubbing        Assessment & Plan:

## 2016-09-27 NOTE — Patient Instructions (Signed)
Please [facility] and we will review report from your CPAP Refills on Nuvigil will be given for 9 months- continue  drug holiday on the weekends

## 2016-10-13 ENCOUNTER — Encounter: Payer: Self-pay | Admitting: Pulmonary Disease

## 2016-10-24 ENCOUNTER — Telehealth: Payer: Self-pay | Admitting: Pulmonary Disease

## 2016-10-24 NOTE — Telephone Encounter (Signed)
Per Dr. Elsworth Soho, download shows good usage. 9cm is adequate for patient.

## 2016-10-27 NOTE — Telephone Encounter (Signed)
Spoke with patient about CPAP download. Patient verbalized understanding. Had no further questions.

## 2016-10-31 ENCOUNTER — Other Ambulatory Visit: Payer: Self-pay | Admitting: Family Medicine

## 2016-11-10 ENCOUNTER — Telehealth: Payer: Self-pay | Admitting: Pulmonary Disease

## 2016-11-10 NOTE — Telephone Encounter (Signed)
Contacted number provided for pt for his medication it was for a PA. According to them on their end the PA was already done and it was approved with the dates of 11/09/16-11/09/17. Contacted the pt to reach back out to [facility] because they stated it was approved. Unsure of who did not PA nothing was documented. Nothing further is needed

## 2016-12-29 ENCOUNTER — Telehealth: Payer: Self-pay | Admitting: Pulmonary Disease

## 2016-12-29 MED ORDER — ARMODAFINIL 150 MG PO TABS: 150.0000 mg | ORAL_TABLET | Freq: Every day | ORAL | 1 refills | Status: DC

## 2016-12-29 NOTE — Telephone Encounter (Signed)
Pt states that he is supposed to get 90 day supply of Nuvigil and he only got 30 days.  Requests refill sent to [facility] 90-day. This has been phoned in. Pt aware. Nothing further needed.

## 2017-02-27 ENCOUNTER — Other Ambulatory Visit: Payer: 59

## 2017-03-06 ENCOUNTER — Ambulatory Visit (INDEPENDENT_AMBULATORY_CARE_PROVIDER_SITE_OTHER): Payer: 59 | Admitting: Family Medicine

## 2017-03-06 ENCOUNTER — Encounter: Payer: Self-pay | Admitting: Family Medicine

## 2017-03-06 VITALS — BP 122/68 | HR 71 | Temp 98.6°F | Ht 70.25 in | Wt 210.0 lb

## 2017-03-06 DIAGNOSIS — Z0000 Encounter for general adult medical examination without abnormal findings: Secondary | ICD-10-CM | POA: Diagnosis not present

## 2017-03-06 DIAGNOSIS — E785 Hyperlipidemia, unspecified: Secondary | ICD-10-CM | POA: Diagnosis not present

## 2017-03-06 DIAGNOSIS — Z125 Encounter for screening for malignant neoplasm of prostate: Secondary | ICD-10-CM | POA: Diagnosis not present

## 2017-03-06 DIAGNOSIS — I10 Essential (primary) hypertension: Secondary | ICD-10-CM | POA: Diagnosis not present

## 2017-03-06 LAB — COMPREHENSIVE METABOLIC PANEL
ALK PHOS: 64 U/L (ref 39–117)
ALT: 20 U/L (ref 0–53)
AST: 21 U/L (ref 0–37)
Albumin: 4.5 g/dL (ref 3.5–5.2)
BILIRUBIN TOTAL: 0.7 mg/dL (ref 0.2–1.2)
BUN: 16 mg/dL (ref 6–23)
CALCIUM: 10.1 mg/dL (ref 8.4–10.5)
CO2: 31 meq/L (ref 19–32)
Chloride: 101 mEq/L (ref 96–112)
Creatinine, Ser: 0.88 mg/dL (ref 0.40–1.50)
GFR: 94.22 mL/min (ref >60.00)
Glucose, Bld: 89 mg/dL (ref 70–99)
Potassium: 4.2 mEq/L (ref 3.5–5.1)
Sodium: 140 mEq/L (ref 135–145)
Total Protein: 6.5 g/dL (ref 6.0–8.3)

## 2017-03-06 LAB — LIPID PANEL
CHOL/HDL RATIO: 3
CHOLESTEROL: 151 mg/dL (ref 0–200)
HDL: 51.7 mg/dL (ref >39.00)
LDL Cholesterol: 82 mg/dL (ref 0–99)
NonHDL: 99.19
TRIGLYCERIDES: 85 mg/dL (ref 0.0–149.0)
VLDL: 17 mg/dL (ref 0.0–40.0)

## 2017-03-06 LAB — CBC
HCT: 48.4 % (ref 39.0–52.0)
HEMOGLOBIN: 16.6 g/dL (ref 13.0–17.0)
MCHC: 34.3 g/dL (ref 30.0–36.0)
MCV: 93.4 fl (ref 78.0–100.0)
PLATELETS: 291 10*3/uL (ref 150.0–400.0)
RBC: 5.18 Mil/uL (ref 4.22–5.81)
RDW: 13.2 % (ref 11.5–15.5)
WBC: 7.5 10*3/uL (ref 4.0–10.5)

## 2017-03-06 LAB — PSA: PSA: 2.06 ng/mL (ref 0.10–4.00)

## 2017-03-06 MED ORDER — SIMVASTATIN 40 MG PO TABS: 40.0000 mg | ORAL_TABLET | Freq: Every day | ORAL | 3 refills | Status: DC

## 2017-03-06 MED ORDER — LISINOPRIL-HYDROCHLOROTHIAZIDE 10-12.5 MG PO TABS: 1.0000 | ORAL_TABLET | Freq: Every day | ORAL | 3 refills | Status: DC

## 2017-03-06 NOTE — Progress Notes (Signed)
Phone: [phone]  Subjective:  Patient presents today for their annual physical. Chief complaint-noted.   See problem oriented charting- ROS- full  review of systems was completed and negative including No chest pain or shortness of breath. No headache or blurry vision.   The following were reviewed and entered/updated in epic: Past Medical History:  Diagnosis Date  . Depression   . Hyperlipidemia   . Pneumonia   . Sleep apnea    Patient Active Problem List   Diagnosis Date Noted  . Essential hypertension 08/31/2015    Priority: Medium  . Hyperlipidemia 05/11/2007    Priority: Medium  . OSA (obstructive sleep apnea) 05/11/2007    Priority: Medium  . Hypersomnia with sleep apnea 10/14/2015    Priority: Low  . Obese 04/15/2015    Priority: Low  . Right hip pain 01/19/2015    Priority: Low  . History of basal cell cancer 05/10/2010    Priority: Low   Past Surgical History:  Procedure Laterality Date  . none      Family History  Problem Relation Age of Onset  . Depression Mother   . Alcohol abuse Mother   . Gallbladder disease Father   . Heart disease Father        CHF- died from this  . Polycythemia Father   . Alcohol abuse Sister     Medications- reviewed and updated Current Outpatient Prescriptions  Medication Sig Dispense Refill  . Armodafinil (NUVIGIL) 150 MG tablet Take 1 tablet (150 mg total) by mouth daily. 90 tablet 1  . aspirin EC 81 MG tablet Take 81 mg by mouth daily.    Marland Kitchen lisinopril-hydrochlorothiazide (PRINZIDE,ZESTORETIC) 10-12.5 MG tablet Take 1 tablet by mouth daily. 90 tablet 3  . MELATONIN PO Take by mouth. Pt not sure of dose of Melatonin he is taking    . simvastatin (ZOCOR) 40 MG tablet Take 1 tablet (40 mg total) by mouth at bedtime. 90 tablet 3   No current facility-administered medications for this visit.     Allergies-reviewed and updated No Known Allergies  Social History   Social History  . Marital status: Married   Spouse name: N/A  . Number of children: N/A  . Years of education: N/A   Social History Main Topics  . Smoking status: Never Smoker  . Smokeless tobacco: Never Used  . Alcohol use 0.0 oz/week     Comment: sparing glass of wine or mixed drink  . Drug use: No  . Sexual activity: Not Asked   Other Topics Concern  . None   Social History Narrative   Married 2001. 2 stepchildren. Both children graduating by 2017 from college- oldest in [place] and younger child Al working on finding different job- with [employer] currently.  1 dog- lab collie mix.       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 Oropharynx normal Neck: no thyromegaly CV: RRR no murmurs rubs or gallops Lungs: CTAB no crackles, wheeze, rhonchi Abdomen: soft/nontender/nondistended/normal bowel sounds. No rebound or guarding.  Ext: no edema Skin: warm, dry Neuro:  grossly normal, moves all extremities, PERRLA Rectal: normal tone, diffusely enlarged prostate, no masses or tenderness  Assessment/Plan:  61 y.o. male presenting for annual physical.  Health Maintenance counseling: 1. Anticipatory guidance: Patient counseled regarding regular dental exams - advised q6 month, eye exams advised yearly, wearing seatbelts.  2. Risk factor reduction:  Advised patient of need for regular exercise and diet rich and fruits and vegetables to reduce risk of heart attack and stroke. Last year had lost 13 lbs- we had targetted under 200 for long term weight. Down another 7 lbs- walking dog regularly and trying to eat reasonable portions.  Wt Readings from Last 3 Encounters:  03/06/17 210 lb (95.3  kg)  09/27/16 217 lb 9.6 oz (98.7 kg)  04/15/16 217 lb (98.4 kg)  3. Immunizations/screenings/ancillary studies- reconsider shingrix next year Immunization History  Administered Date(s) Administered  . Influenza Split 04/26/2012  . Influenza Whole 04/09/2010  . Influenza,inj,Quad PF,36+ Mos 06/24/2014  . Influenza-Unspecified 05/16/2015  . Td 08/30/2005  . Tdap 12/14/2015  4. Prostate cancer screening- prior psa trend low risk- recheck today. Some BPH on exam. Peeing 0-1x a night still but had been 2-3x a night before cutting down on coffee  Lab Results  Component Value Date   PSA 1.60 02/29/2016   PSA 1.83 07/04/2014   PSA 1.82 04/22/2013   5. Colon cancer screening - 02/09/09 with 10 year repeat.  6. Skin cancer screening- history of basal cell skin cancer. Had referred back to dermatology previously- one place on right cheek. Gave # for skin surgery center again- he agrees to call.   Status of chronic or acute concerns   OSA- compliant with CPAP. On nuvigil thourh Dr. Elsworth Soho  HLD- has been controlled on simvastatin 40mg   HTN-  controlled on lisinopril 10-12.5mg   1 year CPE  Orders Placed This Encounter  Procedures  . CBC    [place]  . Comprehensive metabolic panel    [place]    Order Specific Question:   Has the patient fasted?    Answer:   No  . Lipid panel    [place]    Order Specific Question:   Has the patient fasted?    Answer:   No  . PSA   Meds ordered this encounter  Medications  . simvastatin (ZOCOR) 40 MG tablet    Sig: Take 1 tablet (40 mg total) by mouth at bedtime.    Dispense:  90 tablet    Refill:  3  . lisinopril-hydrochlorothiazide (PRINZIDE,ZESTORETIC) 10-12.5 MG tablet    Sig: Take 1 tablet by mouth daily.    Dispense:  90 tablet    Refill:  3   Return precautions advised.  Garret Reddish, MD

## 2017-03-06 NOTE — Patient Instructions (Addendum)
Things look great again due to your continue dedication to walking and eating healthier  I usually see folks with high blood pressure every 6 months but if you continue to lose weight and are able to tolerate the medicines- I am ok with yearly follow up  Please make sure to see dentist every 6 months and eye doctor yearly (particularly if any vision changes)  Consider shingrix for next year  I think a good full skin exam by skin surgery center would be great and have them look at your cheek- particularly with your history of skin cancer  Refilled meds for 1 year.   Please stop by lab before you go

## 2017-05-12 DIAGNOSIS — G4733 Obstructive sleep apnea (adult) (pediatric): Secondary | ICD-10-CM | POA: Diagnosis not present

## 2017-06-28 ENCOUNTER — Encounter: Payer: Self-pay | Admitting: Adult Health

## 2017-06-28 ENCOUNTER — Ambulatory Visit (INDEPENDENT_AMBULATORY_CARE_PROVIDER_SITE_OTHER): Payer: 59 | Admitting: Adult Health

## 2017-06-28 ENCOUNTER — Telehealth: Payer: Self-pay | Admitting: Pulmonary Disease

## 2017-06-28 DIAGNOSIS — G4733 Obstructive sleep apnea (adult) (pediatric): Secondary | ICD-10-CM | POA: Diagnosis not present

## 2017-06-28 DIAGNOSIS — G4730 Sleep apnea, unspecified: Secondary | ICD-10-CM | POA: Diagnosis not present

## 2017-06-28 DIAGNOSIS — G4710 Hypersomnia, unspecified: Secondary | ICD-10-CM

## 2017-06-28 MED ORDER — ARMODAFINIL 150 MG PO TABS: 150.0000 mg | ORAL_TABLET | Freq: Every day | ORAL | 1 refills | Status: DC

## 2017-06-28 NOTE — Assessment & Plan Note (Signed)
Improved on Nuvigil   Plan  Cont on current regimen

## 2017-06-28 NOTE — Telephone Encounter (Signed)
Pt seen 06/28/17 for OSA follow up  Pt is also on Nuvigil 150 QHS and is requesting refills to [facility]  Dr Elsworth Soho please advise if okay to send, thank you.

## 2017-06-28 NOTE — Assessment & Plan Note (Signed)
Well controlled on CPAP   Plan  Patient Instructions  Continue on CPAP at bedtime Work on healthy weight Do not drive if sleepy Continue on Nuvigil Follow-up with Dr. Elsworth Soho in 6 months and As needed

## 2017-06-28 NOTE — Telephone Encounter (Signed)
Ok to send

## 2017-06-28 NOTE — Telephone Encounter (Signed)
Rx telephoned to [facility], spoke with pharmacist Remo Lipps Rx for Nugivil 150mg  QD #90 w/ 1 additional refill as last rx'd Due in about 5 days, will ship once due  Patient is aware Nothing further needed; will sign off

## 2017-06-28 NOTE — Patient Instructions (Signed)
Continue on CPAP at bedtime Work on healthy weight Do not drive if sleepy Continue on Nuvigil Follow-up with Dr. Elsworth Soho in 6 months and As needed

## 2017-06-28 NOTE — Progress Notes (Signed)
@Patient  ID: Vincent Wilkerson, male    DOB: 06/27/1956, 61 y.o.   MRN: [id number]  Chief Complaint  Patient presents with  . Follow-up    OSA    Referring provider: Marin Olp, MD  HPI: 61 year old male followed for obstructive sleep apnea and hypersomnia  Significant tests/ events  PSG - [place], [place] in 08/2001- 220 pounds- (RDI) was 21 events per hour with an AHI of 14.9 events per hour. The lowest oxygen desaturation was 85%. For supine sleep the AHI was 53.4 events per hour. During a subsequent study, nasal CPAP was initiated at 4 cm and titrated to 9 cm. He is maintained on respironics 9 cm , medium mirage mask since  06/28/2017 Follow up : OSA and Hypersomnia  Patient presents for a six-month follow-up.  Patient has known obstructive sleep apnea on CPAP at bedtime.  Patient says he wears it every single night.  He feels rested. Download shows excellent compliance with average usage at 6.5 hours.  AHI 0.3.  Patient is on CPAP 9 cm H2O. Patient has persistent hypersomnia.  He remains on Nuvigil Monday through Friday.. Feels this really has helped him tremendously at work. Denies any known side effects.     No Known Allergies  Immunization History  Administered Date(s) Administered  . Influenza Split 04/26/2012, 06/07/2017  . Influenza Whole 04/09/2010  . Influenza,inj,Quad PF,6+ Mos 06/24/2014  . Influenza-Unspecified 05/16/2015  . Td 08/30/2005  . Tdap 12/14/2015    Past Medical History:  Diagnosis Date  . Depression   . Hyperlipidemia   . Pneumonia   . Sleep apnea     Tobacco History: Social History   Tobacco Use  Smoking Status Never Smoker  Smokeless Tobacco Never Used   Counseling given: Not Answered   Outpatient Encounter Medications as of 06/28/2017  Medication Sig  . Armodafinil (NUVIGIL) 150 MG tablet Take 1 tablet (150 mg total) by mouth daily.  Marland Kitchen aspirin EC 81 MG tablet Take 81 mg by mouth daily.  Marland Kitchen lisinopril-hydrochlorothiazide  (PRINZIDE,ZESTORETIC) 10-12.5 MG tablet Take 1 tablet by mouth daily.  Marland Kitchen MELATONIN PO Take by mouth. Pt not sure of dose of Melatonin he is taking  . simvastatin (ZOCOR) 40 MG tablet Take 1 tablet (40 mg total) by mouth at bedtime.   No facility-administered encounter medications on file as of 06/28/2017.      Review of Systems  Constitutional:   No  weight loss, night sweats,  Fevers, chills, fatigue, or  lassitude.  HEENT:   No headaches,  Difficulty swallowing,  Tooth/dental problems, or  Sore throat,                No sneezing, itching, ear ache, nasal congestion, post nasal drip,   CV:  No chest pain,  Orthopnea, PND, swelling in lower extremities, anasarca, dizziness, palpitations, syncope.   GI  No heartburn, indigestion, abdominal pain, nausea, vomiting, diarrhea, change in bowel habits, loss of appetite, bloody stools.   Resp: No shortness of breath with exertion or at rest.  No excess mucus, no productive cough,  No non-productive cough,  No coughing up of blood.  No change in color of mucus.  No wheezing.  No chest wall deformity  Skin: no rash or lesions.  GU: no dysuria, change in color of urine, no urgency or frequency.  No flank pain, no hematuria   MS:  No joint pain or swelling.  No decreased range of motion.  No back pain.  Physical Exam  BP 112/82 (BP Location: Left Arm, Cuff Size: Normal)   Pulse 72   Ht 5\' 10"  (1.778 m)   Wt 211 lb (95.7 kg)   SpO2 97%   BMI 30.28 kg/m   GEN: A/Ox3; pleasant , NAD    HEENT:  [place]/AT,  EACs-clear, TMs-wnl, NOSE-clear, THROAT-clear, no lesions, no postnasal drip or exudate noted. Class 2-3 MP airway .   NECK:  Supple w/ fair ROM; no JVD; normal carotid impulses w/o bruits; no thyromegaly or nodules palpated; no lymphadenopathy.    RESP  Clear  P & A; w/o, wheezes/ rales/ or rhonchi. no accessory muscle use, no dullness to percussion  CARD:  RRR, no m/r/g, no peripheral edema, pulses intact, no cyanosis or  clubbing.  GI:   Soft & nt; nml bowel sounds; no organomegaly or masses detected.   Musco: Warm bil, no deformities or joint swelling noted.   Neuro: alert, no focal deficits noted.    Skin: Warm, no lesions or rashes    Lab Results:  CBC  BNP No results found for: BNP  ProBNP No results found for: PROBNP  Imaging: No results found.   Assessment & Plan:   No problem-specific Assessment & Plan notes found for this encounter.     Rexene Edison, NP 06/28/2017

## 2017-06-30 NOTE — Progress Notes (Signed)
Reviewed & agree with plan  

## 2017-08-25 ENCOUNTER — Ambulatory Visit (INDEPENDENT_AMBULATORY_CARE_PROVIDER_SITE_OTHER): Payer: 59 | Admitting: Family Medicine

## 2017-08-25 ENCOUNTER — Encounter: Payer: Self-pay | Admitting: Family Medicine

## 2017-08-25 VITALS — BP 108/66 | HR 87 | Temp 97.7°F | Ht 70.0 in | Wt 211.8 lb

## 2017-08-25 DIAGNOSIS — J329 Chronic sinusitis, unspecified: Secondary | ICD-10-CM

## 2017-08-25 DIAGNOSIS — B9689 Other specified bacterial agents as the cause of diseases classified elsewhere: Secondary | ICD-10-CM

## 2017-08-25 DIAGNOSIS — I10 Essential (primary) hypertension: Secondary | ICD-10-CM

## 2017-08-25 MED ORDER — AMOXICILLIN-POT CLAVULANATE 875-125 MG PO TABS: 1.0000 | ORAL_TABLET | Freq: Two times a day (BID) | ORAL | 0 refills | Status: AC

## 2017-08-25 NOTE — Progress Notes (Signed)
PCP: Marin Olp, MD  Subjective:  Vincent Wilkerson is a 62 y.o. year old very pleasant male patient who presents with sinusitis symptoms including nasal congestion, sinus tenderness -other symptoms include: low appetite. Seems to have some mental fog with this, feels run down -day of illness:at least day 14 -Symptoms are worsening. Felt like getting better then [place] on Wednesday  -previous treatments: rest, hydration.  -sick contacts/travel/risks: denies flu exposure. Several folks sick at work -Hx of: allergies  ROS-denies fever, SOB, NVD, tooth pain  Pertinent Past Medical History-  Patient Active Problem List   Diagnosis Date Noted  . Essential hypertension 08/31/2015    Priority: Medium  . Hyperlipidemia 05/11/2007    Priority: Medium  . OSA (obstructive sleep apnea) 05/11/2007    Priority: Medium  . Hypersomnia with sleep apnea 10/14/2015    Priority: Low  . Obese 04/15/2015    Priority: Low  . Right hip pain 01/19/2015    Priority: Low  . History of basal cell cancer 05/10/2010    Priority: Low    Medications- reviewed  Current Outpatient Medications  Medication Sig Dispense Refill  . Armodafinil (NUVIGIL) 150 MG tablet Take 1 tablet (150 mg total) daily by mouth. 90 tablet 1  . aspirin EC 81 MG tablet Take 81 mg by mouth daily.    Marland Kitchen lisinopril-hydrochlorothiazide (PRINZIDE,ZESTORETIC) 10-12.5 MG tablet Take 1 tablet by mouth daily. 90 tablet 3  . MELATONIN PO Take by mouth. Pt not sure of dose of Melatonin he is taking    . simvastatin (ZOCOR) 40 MG tablet Take 1 tablet (40 mg total) by mouth at bedtime. 90 tablet 3   No current facility-administered medications for this visit.     Objective: BP 108/66 (BP Location: Left Arm, Patient Position: Sitting, Cuff Size: Large)   Pulse 87   Temp 97.7 F (36.5 C) (Oral)   Ht 5\' 10"  (1.778 m)   Wt 211 lb 12.8 oz (96.1 kg)   SpO2 97%   BMI 30.39 kg/m  Gen: NAD, resting comfortably, intermittent cough  noted HEENT: Turbinates  Very erythematous, TM normal, pharynx mildly erythematous with no tonsilar exudate or edema, minimal sinus tenderness CV: RRR no murmurs rubs or gallops Lungs: CTAB no crackles, wheeze, rhonchi Ext: no edema Skin: warm, dry, no rash Neuro: grossly normal, moves all extremities  Assessment/Plan:  Sinsusitis Bacterial based on: Symptoms >10 days, double sickening  Treatment: -considered steroid: we opted out of prednisone for now but could use next week if symptoms linger- he can call or send mychart message -other symptomatic care with mucinex -Antibiotic indicated: yes  Essential hypertension Bp low normal today. Patient asks about holding his lisinopril-hctz 10-12.5 mg. I told him to trial half dose until he feels better from illness then can restart full dose.   Finally, we reviewed reasons to return to care including if symptoms worsen or persist or new concerns arise (particularly fever or shortness of breath)  Meds ordered this encounter  Medications  . amoxicillin-clavulanate (AUGMENTIN) 875-125 MG tablet    Sig: Take 1 tablet by mouth 2 (two) times daily for 7 days.    Dispense:  14 tablet    Refill:  0    Garret Reddish, MD

## 2017-08-25 NOTE — Patient Instructions (Signed)
Sinsusitis Bacterial based on: Symptoms >10 days, double sickening  Treatment: -considered steroid: we opted out of prednisone for now but could use next week if symptoms linger- he can call or send mychart message -other symptomatic care with mucinex -Antibiotic indicated: yes  Finally, we reviewed reasons to return to care including if symptoms worsen or persist or new concerns arise (particularly fever or shortness of breath)  Meds ordered this encounter  Medications  . amoxicillin-clavulanate (AUGMENTIN) 875-125 MG tablet    Sig: Take 1 tablet by mouth 2 (two) times daily for 7 days.    Dispense:  14 tablet    Refill:  0

## 2017-08-26 NOTE — Assessment & Plan Note (Signed)
Bp low normal today. Patient asks about holding his lisinopril-hctz 10-12.5 mg. I told him to trial half dose until he feels better from illness then can restart full dose.

## 2017-10-13 DIAGNOSIS — D225 Melanocytic nevi of trunk: Secondary | ICD-10-CM | POA: Diagnosis not present

## 2017-10-13 DIAGNOSIS — Z85828 Personal history of other malignant neoplasm of skin: Secondary | ICD-10-CM | POA: Diagnosis not present

## 2017-10-13 DIAGNOSIS — L720 Epidermal cyst: Secondary | ICD-10-CM | POA: Diagnosis not present

## 2017-10-13 DIAGNOSIS — L820 Inflamed seborrheic keratosis: Secondary | ICD-10-CM | POA: Diagnosis not present

## 2017-11-15 ENCOUNTER — Telehealth: Payer: Self-pay | Admitting: Pulmonary Disease

## 2017-11-15 NOTE — Telephone Encounter (Signed)
Okay to refill? 

## 2017-11-15 NOTE — Telephone Encounter (Signed)
Called and spoke with Tanisha from [facility]. Prescription has been filled nothing further needed.

## 2017-11-15 NOTE — Telephone Encounter (Signed)
Phone # for [facility] is [phone]

## 2017-11-15 NOTE — Telephone Encounter (Signed)
Pt is requesting a refill on Nuvigil 150mg  to [facility].  States that pharmacy has told him he has a refill but needs office approval to dispense refill.    Last refill: 06/28/17 #90 with 1 refill, take 1 tab PO QD Last ov: 09/27/16 with RA, 06/28/17 with TP Next ov: 12/25/17  Called [facility], states that a PA is needed.  Initiated PA on the phone with [facility], approved through 11/16/2018.   Called pt to make aware.  Nothing further needed.

## 2017-12-25 ENCOUNTER — Ambulatory Visit: Payer: 59 | Admitting: Pulmonary Disease

## 2018-01-01 ENCOUNTER — Encounter: Payer: Self-pay | Admitting: Pulmonary Disease

## 2018-01-01 ENCOUNTER — Ambulatory Visit (INDEPENDENT_AMBULATORY_CARE_PROVIDER_SITE_OTHER): Payer: 59 | Admitting: Pulmonary Disease

## 2018-01-01 DIAGNOSIS — G4730 Sleep apnea, unspecified: Secondary | ICD-10-CM

## 2018-01-01 DIAGNOSIS — G4733 Obstructive sleep apnea (adult) (pediatric): Secondary | ICD-10-CM | POA: Diagnosis not present

## 2018-01-01 DIAGNOSIS — G4710 Hypersomnia, unspecified: Secondary | ICD-10-CM | POA: Diagnosis not present

## 2018-01-01 MED ORDER — ARMODAFINIL 150 MG PO TABS: 150.0000 mg | ORAL_TABLET | Freq: Every day | ORAL | 3 refills | Status: DC

## 2018-01-01 NOTE — Progress Notes (Signed)
   Subjective:    Patient ID: Vincent Wilkerson, male    DOB: [birth year], 62 y.o.   MRN: [id number]  HPI  62 year old with moderate OSA, worse in supine position. His current CPAP machine is since 2017 and seems to be working well.  Uses nasal pillows and denies any problems with mask or pressure or dryness He remains sleepy in spite of CPAP use and was started on Nuvigil. His blood pressure appears a little low today has been on lisinopril for about 2 years. He continues to use Nuvigil on weekdays and does not use them on weekends.  He will take a nap on weekends and tries to catch up in his sleep, reports about 6 hours of sleep on week nights.  CPAP download was reviewed which continues to show good compliance on 9 cm with no residual events and minimal leak  Weight is stable at 209 pounds   Significant tests/ events  PSG - [place], [place] in 08/2001- 220 pounds- (RDI) was 21 events per hour with an AHI of 14.9 events per hour. The lowest oxygen desaturation was 85%. For supine sleep the AHI was 53.4 events per hour.   Titration >> CPAP  9 cm.    Review of Systems Patient denies significant dyspnea,cough, hemoptysis,  chest pain, palpitations, pedal edema, orthopnea, paroxysmal nocturnal dyspnea, lightheadedness, nausea, vomiting, abdominal or  leg pains      Objective:   Physical Exam  Gen. Pleasant, obese, in no distress ENT - no lesions, no post nasal drip Neck: No JVD, no thyromegaly, no carotid bruits Lungs: no use of accessory muscles, no dullness to percussion, decreased without rales or rhonchi  Cardiovascular: Rhythm regular, heart sounds  normal, no murmurs or gallops, no peripheral edema Musculoskeletal: No deformities, no cyanosis or clubbing , no tremors        Assessment & Plan:

## 2018-01-01 NOTE — Assessment & Plan Note (Signed)
inspite of good usage

## 2018-01-01 NOTE — Patient Instructions (Signed)
Refills on nuvigil CPAP 9 cm is working well

## 2018-01-01 NOTE — Assessment & Plan Note (Signed)
Weight loss encouraged, compliance with goal of at least 4-6 hrs every night is the expectation. Advised against medications with sedative side effects Cautioned against driving when sleepy - understanding that sleepiness will vary on a day to day basis  Ct CPAP 9 cm

## 2018-03-12 ENCOUNTER — Telehealth: Payer: Self-pay | Admitting: Pulmonary Disease

## 2018-03-12 NOTE — Telephone Encounter (Signed)
Spoke with pt. He is needing a refill on Nuvigil. This was last refilled on 01/01/18 #90 with 3 refills. This can't be submitted to the pharmacy like this due to the medication being a controlled.  RA - please advise if you are okay with refilling this for him. He would like the prescription to be mailed to him.

## 2018-03-14 MED ORDER — ARMODAFINIL 150 MG PO TABS: 150.0000 mg | ORAL_TABLET | Freq: Every day | ORAL | 1 refills | Status: DC

## 2018-03-14 NOTE — Telephone Encounter (Signed)
Spoke with patient. He said that he never stated that he wanted the RX to be mailed to him. He wishes to have this sent in to [facility]. Advised patient that I would try to send this. He verbalized understanding.   Medication has been called into [facility]. Left a message for patient stating that this was successful.

## 2018-03-14 NOTE — Telephone Encounter (Signed)
RA is out of the office until 03/15/2018; will advise when he returns.

## 2018-03-14 NOTE — Telephone Encounter (Signed)
Ok to refill 

## 2018-04-20 ENCOUNTER — Ambulatory Visit (INDEPENDENT_AMBULATORY_CARE_PROVIDER_SITE_OTHER): Payer: 59 | Admitting: Family Medicine

## 2018-04-20 ENCOUNTER — Encounter: Payer: Self-pay | Admitting: Family Medicine

## 2018-04-20 VITALS — BP 128/87 | HR 84 | Ht 71.0 in | Wt 210.0 lb

## 2018-04-20 DIAGNOSIS — M25561 Pain in right knee: Secondary | ICD-10-CM | POA: Diagnosis not present

## 2018-04-20 NOTE — Patient Instructions (Signed)
Your exam is reassuring. 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  You likely bruised the knee a little but you also have a tight IT band. All the ligament and meniscus testing is normal.  You don't need x-rays for this either. Avoid painful activities when possible (often deep squats, lunges bother this). Straight leg raise, hip side raises, straight leg raises with foot turned outwards 3 sets of 10 once a day. Add ankle weight if these become too easy. Pick 2-3 of the IT band stretches, hold for 20-30 seconds and repeat 3 times. Consider formal physical therapy if not improving as expected. Avoid flat shoes, barefoot walking as much as possible. Icing 15 minutes at a time 3-4 times a day as needed. Tylenol or ibuprofen as needed for pain. I don't think your prior ankle injury is related to this and your ankle exam is now normal. Follow up with me in 6 weeks or as needed if you're doing well.

## 2018-04-22 ENCOUNTER — Encounter: Payer: Self-pay | Admitting: Family Medicine

## 2018-04-22 NOTE — Progress Notes (Signed)
PCP: Marin Olp, MD  Subjective:   HPI: Patient is a 62 y.o. male here for right knee pain.  Patient reports about 4 weeks ago he was helping his son move. He developed pain anterior, lateral right knee with coming down the stairs. Thought he was improving though when recently ran across road with his dog to avoid traffic felt sharp pain again. Pain currently 0/10 at rest. Better with ibuprofen. Has been icing too. Feels stiffness with sitting. No skin changes, numbness. No obvious swelling.  Past Medical History:  Diagnosis Date  . Depression   . Hyperlipidemia   . Pneumonia   . Sleep apnea     Current Outpatient Medications on File Prior to Visit  Medication Sig Dispense Refill  . Armodafinil (NUVIGIL) 150 MG tablet Take 1 tablet (150 mg total) by mouth daily. 90 tablet 1  . aspirin EC 81 MG tablet Take 81 mg by mouth daily.    Marland Kitchen lisinopril-hydrochlorothiazide (PRINZIDE,ZESTORETIC) 10-12.5 MG tablet Take 1 tablet by mouth daily. 90 tablet 3  . MELATONIN PO Take by mouth. Pt not sure of dose of Melatonin he is taking    . simvastatin (ZOCOR) 40 MG tablet Take 1 tablet (40 mg total) by mouth at bedtime. 90 tablet 3   No current facility-administered medications on file prior to visit.     Past Surgical History:  Procedure Laterality Date  . none      No Known Allergies  Social History   Socioeconomic History  . Marital status: Married    Spouse name: Not on file  . Number of children: Not on file  . Years of education: Not on file  . Highest education level: Not on file  Occupational History  . Not on file  Social Needs  . Financial resource strain: Not on file  . Food insecurity:    Worry: Not on file    Inability: Not on file  . Transportation needs:    Medical: Not on file    Non-medical: Not on file  Tobacco Use  . Smoking status: Never Smoker  . Smokeless tobacco: Never Used  Substance and Sexual Activity  . Alcohol use: Yes   Alcohol/week: 0.0 standard drinks    Comment: sparing glass of wine or mixed drink  . Drug use: No  . Sexual activity: Not on file  Lifestyle  . Physical activity:    Days per week: Not on file    Minutes per session: Not on file  . Stress: Not on file  Relationships  . Social connections:    Talks on phone: Not on file    Gets together: Not on file    Attends religious service: Not on file    Active member of club or organization: Not on file    Attends meetings of clubs or organizations: Not on file    Relationship status: Not on file  . Intimate partner violence:    Fear of current or ex partner: Not on file    Emotionally abused: Not on file    Physically abused: Not on file    Forced sexual activity: Not on file  Other Topics Concern  . Not on file  Social History Narrative   Married 2001. 2 stepchildren. Both children graduating by 2017 from college- oldest in [place] and younger child Al working on finding different job- with [employer] currently.  1 dog- lab collie mix.       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 Depression Mother   . Alcohol abuse Mother   . Gallbladder disease Father   . Heart disease Father        CHF- died from this  . Polycythemia Father   . Alcohol abuse Sister     BP 128/87   Pulse 84   Ht 5\' 11"  (1.803 m)   Wt 210 lb (95.3 kg)   BMI 29.29 kg/m   Review of Systems: See HPI above.     Objective:  Physical Exam:  Gen: NAD, comfortable in exam room  Right knee: No gross deformity, ecchymoses, swelling. No TTP. FROM with 5/5 strength. Mild lateral patellar shift with flexion to extension. Negative ant/post drawers. Negative valgus/varus testing. Negative lachmanns. Negative mcmurrays, apleys, patellar apprehension. NV intact distally. Positive ober's.  Left knee: No deformity. FROM with 5/5 strength. No  tenderness to palpation. NVI distally.   Right ankle: No gross deformity, swelling, ecchymoses FROM No TTP Negative ant drawer and talar tilt.   Thompsons test negative. NV intact distally.  Assessment & Plan:  1. Right knee pain - patient's exam is reassuring.  He does have evidence of mild patellofemoral syndrome and IT band syndrome but likely with a knee contusion as well.  Shown home exercises to do daily.  Icing, tylenol or ibuprofen.  Consider PT if not improving.  F/u in 6 weeks or prn if doing well.

## 2018-05-24 ENCOUNTER — Other Ambulatory Visit: Payer: Self-pay | Admitting: Family Medicine

## 2018-05-24 MED ORDER — SIMVASTATIN 40 MG PO TABS: 40.0000 mg | ORAL_TABLET | Freq: Every day | ORAL | 0 refills | Status: DC

## 2018-05-24 MED ORDER — LISINOPRIL-HYDROCHLOROTHIAZIDE 10-12.5 MG PO TABS: 1.0000 | ORAL_TABLET | Freq: Every day | ORAL | 0 refills | Status: DC

## 2018-05-24 NOTE — Telephone Encounter (Signed)
Requested Prescriptions  Pending Prescriptions Disp Refills  . lisinopril-hydrochlorothiazide (PRINZIDE,ZESTORETIC) 10-12.5 MG tablet 90 tablet 3    Sig: Take 1 tablet by mouth daily.     Cardiovascular:  ACEI + Diuretic Combos Failed - 05/24/2018 10:28 AM      Failed - Na in normal range and within 180 days    Sodium  Date Value Ref Range Status  03/06/2017 140 135 - 145 mEq/L Final         Failed - K in normal range and within 180 days    Potassium  Date Value Ref Range Status  03/06/2017 4.2 3.5 - 5.1 mEq/L Final         Failed - Cr in normal range and within 180 days    Creatinine, Ser  Date Value Ref Range Status  03/06/2017 0.88 0.40 - 1.50 mg/dL Final         Failed - Ca in normal range and within 180 days    Calcium  Date Value Ref Range Status  03/06/2017 10.1 8.4 - 10.5 mg/dL Final         Failed - Valid encounter within last 6 months    Recent Outpatient Visits          9 months ago Bacterial sinusitis   [place], Brayton Mars, MD   1 year ago Preventative health care   [facility], Brayton Mars, MD   2 years ago Preventative health care   [facility] [place], Brayton Mars, MD   2 years ago Essential hypertension   [facility] [place], Brayton Mars, MD   2 years ago Essential hypertension   [facility], Jory Ee, MD      Future Appointments            In 3 months Yong Channel, Brayton Mars, MD [place], [place] - Patient is not pregnant      Passed - Last BP in normal range    BP Readings from Last 1 Encounters:  04/20/18 128/87       . simvastatin (ZOCOR) 40 MG tablet 90 tablet 3    Sig: Take 1 tablet (40 mg total) by mouth at bedtime.     Cardiovascular:  Antilipid - Statins Failed - 05/24/2018 10:28 AM      Failed - Total Cholesterol in normal range and within 360 days    Cholesterol  Date  Value Ref Range Status  03/06/2017 151 0 - 200 mg/dL Final    Comment:    ATP III Classification       Desirable:  < 200 mg/dL               Borderline High:  200 - 239 mg/dL          High:  > = 240 mg/dL         Failed - LDL in normal range and within 360 days    LDL Cholesterol  Date Value Ref Range Status  03/06/2017 82 0 - 99 mg/dL Final         Failed - HDL in normal range and within 360 days    HDL  Date Value Ref Range Status  03/06/2017 51.70 >39.00 mg/dL Final         Failed - Triglycerides in normal range and within 360 days    Triglycerides  Date  Value Ref Range Status  03/06/2017 85.0 0.0 - 149.0 mg/dL Final    Comment:    Normal:  <150 mg/dLBorderline High:  150 - 199 mg/dL         Passed - Patient is not pregnant      Passed - Valid encounter within last 12 months    Recent Outpatient Visits          9 months ago Bacterial sinusitis   [facility], Brayton Mars, MD   1 year ago Preventative health care   [facility] [place], Brayton Mars, MD   2 years ago Preventative health care   [facility] [place], Brayton Mars, MD   2 years ago Essential hypertension   [facility] [place], Brayton Mars, MD   2 years ago Essential hypertension   [facility] [place], MD      Future Appointments            In 3 months Yong Channel, Brayton Mars, MD [place], [facility]

## 2018-05-24 NOTE — Telephone Encounter (Signed)
Copied from [place] [id number]. Topic: Quick Communication - Rx Refill/Question >> May 24, 2018 10:17 AM Carolyn Stare wrote: Medication:    lisinopril-hydrochlorothiazide (PRINZIDE,ZESTORETIC) 10-12.5 MG tablet               simvastatin (ZOCOR) 40 MG tablet   Pt has scheduled his CPE nothing available till 08/2018     Preferred Pharmacy   [facility]   Agent: Please be advised that RX refills may take up to 3 business days. We ask that you follow-up with your pharmacy.

## 2018-06-07 ENCOUNTER — Ambulatory Visit (INDEPENDENT_AMBULATORY_CARE_PROVIDER_SITE_OTHER): Payer: 59 | Admitting: Family Medicine

## 2018-06-07 ENCOUNTER — Ambulatory Visit (HOSPITAL_BASED_OUTPATIENT_CLINIC_OR_DEPARTMENT_OTHER)
Admission: RE | Admit: 2018-06-07 | Discharge: 2018-06-07 | Disposition: A | Discharge status: Home / Self Care | Payer: 59 | Source: Ambulatory Visit | Attending: Family Medicine | Admitting: Family Medicine

## 2018-06-07 DIAGNOSIS — M79661 Pain in right lower leg: Secondary | ICD-10-CM | POA: Diagnosis not present

## 2018-06-07 DIAGNOSIS — M7989 Other specified soft tissue disorders: Secondary | ICD-10-CM | POA: Diagnosis not present

## 2018-06-07 NOTE — Progress Notes (Signed)
     Subjective:  HPI: Vincent Wilkerson is a 62 y.o. presenting to clinic today to discuss the following:  Rt Knee Pain Patient presents today for continued right knee pain. Overall, he is having minimal improvement. He states the pain is intermittent but made worse with activity, especially going downhill. Pain level 2/10 currently but up to 8-10/10.  He still walks his dog 3 miles every day. The pain starts on the outside of his right knee and he is now having associated calf pain. The pain does wake him from sleep at night most nights. He is also having associated right foot numbness and tingling that is intermittent. He stretches, takes Ibuprofen, uses ice, and sleeps with a pillow between his legs and that does help. The pain is minimal in the mornings.   He denies fever, chills, erythema or warmth of the right knee. No Hx of gout.  ROS noted in HPI.   Past Medical, Surgical, Social, and Family History Reviewed & Updated per EMR.   Pertinent Historical Findings include:   Social History   Tobacco Use  Smoking Status Never Smoker  Smokeless Tobacco Never Used    Objective: BP 122/83   Pulse 80   Ht 5\' 10"  (1.778 m)   Wt 210 lb (95.3 kg)   BMI 30.13 kg/m  Vitals and nursing notes reviewed  Physical Exam Gen: NAD, comfortable in exam room  Right knee: No gross deformity, effusion, or ecchymosis. Non-tender to palpation. FROM with 5/5 strength. Negative Lachman's test, negative varus/valgus testing, negative McMurray's. Positive Ober's test. No crepitus. +2 dorsalis pedis and posterior tibialis pulses pulses +1 patellar and achilles reflexes  Left knee: No gross deformity, effusion, or ecchymosis. Non-tender to palpation. FROM with 5/5 strength. Negative Lachman's test, negative varus/valgus testing, negative McMurray's. No crepitus.  +2 doralis pedis and posterior tibialis pulses +1 patellar and achilles reflexes  No results found for this or any previous visit (from the  past 50 hour(s)).  Assessment/Plan:  Right calf pain Patient presents today with minimal improvement in pain of his right knee that remains on the lateral side and now has right calf pain. Exam is reassuring but we will obtain Dopper U/S of the right lower extremity today to rule out DVT.   Patient will attempt to rest his knee further by obtaining a dog walker for relative rest and wear compression sleeve, continue ice and Ibuprofen as needed.    PATIENT EDUCATION PROVIDED: See AVS    Diagnosis and plan along with any newly prescribed medication(s) were discussed in detail with this patient today. The patient verbalized understanding and agreed with the plan. Patient advised if symptoms worsen return to clinic or ER.   Health Maintainance:   Orders Placed This Encounter  Procedures  . US Venous Img Lower Unilateral Right    Standing Status:   Future    Standing Expiration Date:   08/08/2019    Order Specific Question:   Reason for Exam (SYMPTOM  OR DIAGNOSIS REQUIRED)    Answer:   right calf pain following knee injury - assess for DVT    Order Specific Question:   Preferred imaging location?    Answer:   [facility]    No orders of the defined types were placed in this encounter.   Harolyn Rutherford, DO 06/07/2018, 9:12 AM PGY-2 [place]

## 2018-06-07 NOTE — Patient Instructions (Signed)
Get the doppler ultrasound of your right leg - I'll contact you with results.  Assuming this is ok, you have an atypical calf strain from overuse. Compression sleeve or ace wrap to help with swelling and pain if tolerated. Icing for 15 minutes at a time 3-4 times a day as needed Heel lifts either in temporary orthotics or on their own to prevent further strain. Tylenol and/or aleve for pain. Two legged calf raises, then one legged, then finally doing these on a step.  3 sets of 10 once a day. Consider physical therapy but it's unlikely you will need this. Follow up with me in 6 weeks.

## 2018-06-07 NOTE — Assessment & Plan Note (Addendum)
Patient presents today with minimal improvement in pain of his right knee that remains on the lateral side and now has right calf pain. Exam is reassuring but we will obtain Dopper U/S of the right lower extremity today to rule out DVT.   Patient will attempt to rest his knee further by obtaining a dog walker for relative rest and wear compression sleeve, continue ice and Ibuprofen as needed.

## 2018-06-10 ENCOUNTER — Encounter: Payer: Self-pay | Admitting: Family Medicine

## 2018-08-27 ENCOUNTER — Ambulatory Visit (INDEPENDENT_AMBULATORY_CARE_PROVIDER_SITE_OTHER): Payer: 59 | Admitting: Family Medicine

## 2018-08-27 ENCOUNTER — Encounter: Payer: Self-pay | Admitting: Family Medicine

## 2018-08-27 VITALS — BP 138/88 | HR 77 | Temp 98.0°F | Ht 70.0 in | Wt 213.6 lb

## 2018-08-27 DIAGNOSIS — Z125 Encounter for screening for malignant neoplasm of prostate: Secondary | ICD-10-CM

## 2018-08-27 DIAGNOSIS — Z23 Encounter for immunization: Secondary | ICD-10-CM

## 2018-08-27 DIAGNOSIS — Z0000 Encounter for general adult medical examination without abnormal findings: Secondary | ICD-10-CM | POA: Diagnosis not present

## 2018-08-27 DIAGNOSIS — E785 Hyperlipidemia, unspecified: Secondary | ICD-10-CM

## 2018-08-27 MED ORDER — LISINOPRIL-HYDROCHLOROTHIAZIDE 10-12.5 MG PO TABS: 1.0000 | ORAL_TABLET | Freq: Every day | ORAL | 2 refills | Status: DC

## 2018-08-27 MED ORDER — SIMVASTATIN 40 MG PO TABS: 40.0000 mg | ORAL_TABLET | Freq: Every day | ORAL | 2 refills | Status: DC

## 2018-08-27 NOTE — Progress Notes (Signed)
Phone: [phone]  Subjective:  Patient presents today for their annual physical. Chief complaint-noted.   See problem oriented charting- ROS- full  review of systems was completed and negative except for: tinnitus, visual problems, joint pain- right calf, glute, knee.   The following were reviewed and entered/updated in epic: Past Medical History:  Diagnosis Date  . Depression   . Hyperlipidemia   . Pneumonia   . Sleep apnea    Patient Active Problem List   Diagnosis Date Noted  . Essential hypertension 08/31/2015    Priority: Medium  . Hyperlipidemia 05/11/2007    Priority: Medium  . OSA (obstructive sleep apnea) 05/11/2007    Priority: Medium  . Hypersomnia with sleep apnea 10/14/2015    Priority: Low  . Obese 04/15/2015    Priority: Low  . Right hip pain 01/19/2015    Priority: Low  . History of basal cell cancer 05/10/2010    Priority: Low  . Right calf pain 06/07/2018   Past Surgical History:  Procedure Laterality Date  . none      Family History  Problem Relation Age of Onset  . Depression Mother   . Alcohol abuse Mother   . Gallbladder disease Father   . Heart disease Father        CHF- died from this  . Polycythemia Father   . Alcohol abuse Sister     Medications- reviewed and updated Current Outpatient Medications  Medication Sig Dispense Refill  . Armodafinil (NUVIGIL) 150 MG tablet Take 1 tablet (150 mg total) by mouth daily. 90 tablet 1  . lisinopril-hydrochlorothiazide (PRINZIDE,ZESTORETIC) 10-12.5 MG tablet Take 1 tablet by mouth daily. 90 tablet 2  . simvastatin (ZOCOR) 40 MG tablet Take 1 tablet (40 mg total) by mouth at bedtime. 90 tablet 2   No current facility-administered medications for this visit.     Allergies-reviewed and updated No Known Allergies  Social History   Social History Narrative   Married 2001. 2 stepchildren. Both children graduating by 2017 from college- oldest in [place] and younger child Al working on  finding different job- with [employer] currently.  1 dog- lab collie mix.       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 Oropharynx normal Neck: no thyromegaly CV: RRR no murmurs rubs or gallops Lungs: CTAB no crackles, wheeze, rhonchi Abdomen: soft/nontender/nondistended/normal bowel sounds. No rebound or guarding.  Ext: no edema Skin: warm, dry Neuro: grossly normal, moves all extremities, PERRLA  Assessment/Plan:  63 y.o. male presenting for annual physical.  Health Maintenance counseling: 1. Anticipatory guidance: Patient counseled regarding regular dental exams -q6 months, eye exams - advised to update with some change in vision,  avoiding smoking and second hand smoke , limiting alcohol to 2 beverages per day .   2. Risk factor reduction:  Advised patient of need for regular exercise and diet rich and fruits and vegetables to reduce risk of heart attack and stroke. Exercise-  Doing 40 miles of walking a week- walking his  dog really helps. Diet-outside of the holidays is doing reasonably well- chicken- would like to increase produce, minimal fast foods.  Weight management-unfortunately weight is up 3 pounds from last visit and from last physical in July 2018-prior to that had lost 13 pounds. He wants to keep aiming for 200 Wt Readings from Last 3 Encounters:  08/27/18 213 lb 9.6 oz (96.9 kg)  06/07/18 210 lb (95.3 kg)  04/20/18 210 lb (95.3 kg)  3. Immunizations/screenings/ancillary studies-started Shingrix series today, otherwise up-to-date  Immunization History  Administered  Date(s) Administered  . Influenza Split 04/26/2012, 06/07/2017  . Influenza Whole 04/09/2010  . Influenza,inj,Quad PF,6+ Mos 06/24/2014  . Influenza-Unspecified 05/16/2015, 05/30/2018  . Td 08/30/2005  . Tdap 12/14/2015  . Zoster Recombinat (Shingrix) 08/27/2018  4. Prostate cancer screening- overall PSA trend is low risk.  Some BPH on exam in the past-continues to be 0-1 times a night after cutting down on coffee Lab Results  Component Value Date   PSA 2.06 03/06/2017   PSA 1.60 02/29/2016   PSA 1.83 07/04/2014   5. Colon cancer screening -02/09/2009 with 10-year repeat planned. He is already on the recall list from GI.  6. Skin cancer screening-history of basal cell skin cancer in 2014-does not see dermatology regularly but we referred him back at last physical.  He saw them last summer and had growth removed from cheek- fortunately benign- seborrheic keratosis.  Advised regular sunscreen use. Denies worrisome, changing, or new skin lesions.  7.  Never smoker  Status of chronic or acute concerns   Hypertension-controlled on lisinopril hydrochlorothiazide 10-12.5 mg though high normal- has recently run out 6 weeks ago  Hyperlipidemia-controlled on simvastatin 40 mg in the past though off for last 6 weeks.  Update lipids today.  He is also on aspirin for primary prevention  OSA-compliant with CPAP  Hypersomnia- patient on nuvigil through Dr. Elsworth Soho.   Intermittent ringing in ears- he is not sure if one or both ears. Discussed like hearing loss related- he declines ENT eval or hearing eval  Has seen Dr. Barbaraann Barthel about pain in right knee but also in his calf and his right glute. Doing stretches of IT band/glute and seems to be getting slowly better. Had been advised PT- he wants to continue to monitor and will reach out for PT if fails to continue to improve   Return in about 1 year (around 08/28/2019) for physical.  Lab/Order associations: Fasting  Preventative health care - Plan: CBC,  Lipid panel, Comprehensive metabolic panel, PSA  Need for prophylactic vaccination and inoculation against varicella - Plan: Varicella-zoster vaccine IM  Hyperlipidemia, unspecified hyperlipidemia type - Plan: CBC, Lipid panel, Comprehensive metabolic panel  Screening for prostate cancer - Plan: PSA  Meds ordered this encounter  Medications  . lisinopril-hydrochlorothiazide (PRINZIDE,ZESTORETIC) 10-12.5 MG tablet    Sig: Take 1 tablet by mouth daily.    Dispense:  90 tablet    Refill:  2  . simvastatin (ZOCOR) 40 MG tablet    Sig: Take 1 tablet (40 mg total) by mouth at bedtime.    Dispense:  90 tablet    Refill:  2   Patient Instructions  Schedule a lab visit at the check out desk in 4-5 weeks. Return for future fasting labs meaning nothing but water after midnight please. Ok to take your medications with water.   I think getting an updated eye exam would be reasonable- I see [facility]- would be reasonable online choice to try  Please stop at the check out desk and schedule a nurse visit in 2-5 months to receive your second Shingrix injection.  If we dont see you in 6 months- make sure to keep an eye on blood pressure with goal <140/90  Mineral oil for ear full of wax Purchase mineral oil from laxative aisle Lay down on your side with ear that is bothering you facing up Use 3-4 drops with a dropper and place in ear for 30 seconds Place cotton swab outside of ear Turn to other side and allow this to drain Repeat 3-4 x a day Return to see [place] if not improving within a few days  [employer]- great resource about finances. They have a good podcast and below is the link to their free course [email or web address]://[email or web address].[email or web address]/    Return precautions advised.  Garret Reddish, MD

## 2018-08-27 NOTE — Patient Instructions (Addendum)
Schedule a lab visit at the check out desk in 4-5 weeks. Return for future fasting labs meaning nothing but water after midnight please. Ok to take your medications with water.   I think getting an updated eye exam would be reasonable- I see [facility]- would be reasonable online choice to try  Please stop at the check out desk and schedule a nurse visit in 2-5 months to receive your second Shingrix injection.  If we dont see you in 6 months- make sure to keep an eye on blood pressure with goal <140/90  Mineral oil for ear full of wax Purchase mineral oil from laxative aisle Lay down on your side with ear that is bothering you facing up Use 3-4 drops with a dropper and place in ear for 30 seconds Place cotton swab outside of ear Turn to other side and allow this to drain Repeat 3-4 x a day Return to see [place] if not improving within a few days  [employer]- great resource about finances. They have a good podcast and below is the link to their free course [email or web address]://[email or web address].[email or web address]/

## 2018-10-01 ENCOUNTER — Other Ambulatory Visit (INDEPENDENT_AMBULATORY_CARE_PROVIDER_SITE_OTHER): Payer: 59

## 2018-10-01 DIAGNOSIS — E785 Hyperlipidemia, unspecified: Secondary | ICD-10-CM | POA: Diagnosis not present

## 2018-10-01 DIAGNOSIS — Z125 Encounter for screening for malignant neoplasm of prostate: Secondary | ICD-10-CM

## 2018-10-01 DIAGNOSIS — Z0000 Encounter for general adult medical examination without abnormal findings: Secondary | ICD-10-CM | POA: Diagnosis not present

## 2018-10-01 LAB — COMPREHENSIVE METABOLIC PANEL
ALT: 23 U/L (ref 0–53)
AST: 23 U/L (ref 0–37)
Albumin: 4.3 g/dL (ref 3.5–5.2)
Alkaline Phosphatase: 82 U/L (ref 39–117)
BUN: 17 mg/dL (ref 6–23)
CO2: 30 mEq/L (ref 19–32)
Calcium: 9.6 mg/dL (ref 8.4–10.5)
Chloride: 99 mEq/L (ref 96–112)
Creatinine, Ser: 0.96 mg/dL (ref 0.40–1.50)
GFR: 79.75 mL/min (ref >60.00)
GLUCOSE: 86 mg/dL (ref 70–99)
Potassium: 4.3 mEq/L (ref 3.5–5.1)
Sodium: 137 mEq/L (ref 135–145)
Total Bilirubin: 0.6 mg/dL (ref 0.2–1.2)
Total Protein: 6.8 g/dL (ref 6.0–8.3)

## 2018-10-01 LAB — CBC
HEMATOCRIT: 49.4 % (ref 39.0–52.0)
Hemoglobin: 17.3 g/dL — ABNORMAL HIGH (ref 13.0–17.0)
MCHC: 35.1 g/dL (ref 30.0–36.0)
MCV: 91.2 fl (ref 78.0–100.0)
Platelets: 245 10*3/uL (ref 150.0–400.0)
RBC: 5.41 Mil/uL (ref 4.22–5.81)
RDW: 13.2 % (ref 11.5–15.5)
WBC: 7.2 10*3/uL (ref 4.0–10.5)

## 2018-10-01 LAB — LIPID PANEL
Cholesterol: 179 mg/dL (ref 0–200)
HDL: 50.1 mg/dL (ref >39.00)
LDL Cholesterol: 108 mg/dL — ABNORMAL HIGH (ref 0–99)
NonHDL: 129.15
Total CHOL/HDL Ratio: 4
Triglycerides: 104 mg/dL (ref 0.0–149.0)
VLDL: 20.8 mg/dL (ref 0.0–40.0)

## 2018-10-01 LAB — PSA: PSA: 2.17 ng/mL (ref 0.10–4.00)

## 2018-10-02 ENCOUNTER — Ambulatory Visit: Payer: Self-pay | Admitting: *Deleted

## 2018-10-02 NOTE — Telephone Encounter (Signed)
FYI

## 2018-10-02 NOTE — Telephone Encounter (Signed)
Pt states he has had hiccups since Sunday. States mostly continuous, may stop for a while but briefly. Occurs at night as well. States has occurred in past, 2 years ago, off and on; resolved without intervention within "A few days." States "I may have eaten too fast on Sunday." also states was under a lot of stress last weekend.  Pt states he has tried all the home care remedies, ineffective. Also reports chest "Heaviness", hot flashes, SOB, and elevated HR, at rest 80-102. States baseline 60's. Reports unable to eat but has been staying hydrated. Pt directed to UC. States will follow disposition.  Reason for Disposition . [1] Hiccups present > 3 hours AND [2] severe AND [3] no improvement using hiccup treatment per protocol  Answer Assessment - Initial Assessment Questions 1. ONSET: "When did the hiccups begin?"      Sunday 2. SEVERITY: "How bad are the hiccups now?"  (Severe: unable to eat, drink      or sleep because of hiccups)     Severe 3. TREATMENT: "What have you done so far to treat the hiccups?"    Nothing 4. RECURRENT SYMPTOM: "Have you ever had severe hiccups before?" If so, ask: "When was the last time?" and "What happened that time?"      Yes, went away on their own after 2 days 5. OTHER SYMPTOMS: "Do you have any other symptoms? (e.g., chest pain, difficulty breathing,  abdominal pain, vomiting)     Hot flashes. HR at rest 80-102, SOB and chest heaviness.  Protocols used: [id number]-MC

## 2018-10-02 NOTE — Telephone Encounter (Signed)
See note

## 2018-10-02 NOTE — Telephone Encounter (Signed)
Spoke to pt and he is currently at [facility]. Pt was advise to return phone call if he has any concerns.

## 2018-10-30 ENCOUNTER — Telehealth: Payer: Self-pay | Admitting: Pulmonary Disease

## 2018-10-30 DIAGNOSIS — G4733 Obstructive sleep apnea (adult) (pediatric): Secondary | ICD-10-CM

## 2018-10-30 NOTE — Telephone Encounter (Signed)
Called and spoke with patient he stated that his machine is no longer working properly and he would like a new prescription for a new CPAP machine.   RA please advise, thank you.

## 2018-10-30 NOTE — Telephone Encounter (Signed)
Spoke with pt and relayed info.  Sent in order for new cpap.  Pt wanted [facility] sent to [facility] on [address] in [place] as it is close to work.  Nothing further is needed.

## 2018-10-30 NOTE — Telephone Encounter (Signed)
Okay to provide new prescription for CPAP 9 cm

## 2018-10-31 ENCOUNTER — Telehealth: Payer: Self-pay | Admitting: Pulmonary Disease

## 2018-10-31 MED ORDER — ARMODAFINIL 150 MG PO TABS: 150.0000 mg | ORAL_TABLET | Freq: Every day | ORAL | 1 refills | Status: DC

## 2018-10-31 NOTE — Telephone Encounter (Signed)
Called and spoke with patient, he is requesting a refill of his Armodifinil. Patient would like it to be sent to [facility]. This was last filled in July. Please advise, thank you.

## 2018-10-31 NOTE — Telephone Encounter (Signed)
Sent - pl confirm

## 2018-10-31 NOTE — Telephone Encounter (Signed)
It says the ordering class was "phone"- did you call in or send through app?

## 2018-11-01 MED ORDER — ARMODAFINIL 150 MG PO TABS: 150.0000 mg | ORAL_TABLET | Freq: Every day | ORAL | 1 refills | Status: DC

## 2018-11-01 NOTE — Addendum Note (Signed)
Addended by: Rigoberto Noel on: 11/01/2018 09:10 AM   Modules accepted: Orders

## 2018-11-01 NOTE — Telephone Encounter (Signed)
Sent again

## 2018-11-05 ENCOUNTER — Telehealth: Payer: Self-pay | Admitting: Pulmonary Disease

## 2018-11-05 NOTE — Telephone Encounter (Signed)
LMTCB. We need to know why the office visit is needed. He is scheduled to come for OV in May. Does he need to be seen sooner? Is this for cpap supplies?

## 2018-11-06 NOTE — Telephone Encounter (Signed)
Called and spoke with Sonia Baller with [employer] to see why pt was needing to be scheduled for an OV and I stated to her that pt is scheduled for a 1-year follow up with RA in May 2020. Per Sonia Baller, pt cannot receive a replacement CPAP machine until he has a recent OV and they have the recent OV notes.  Stated to [place] that I would call pt to see if he wanted to keep current scheduled OV or if he wanted to move it up sooner and Sonia Baller expressed understanding.    Called and spoke with pt stating to him the info that I found out from [employer] and asked pt if he would want to do a televisit so we can try to get things taken care of with him receiving a replacement machine. Pt stated that would be fine so a Virtual OV has been scheduled for pt with Derl Barrow, NP Friday, 3/27 at 1:30. Nothing further needed.

## 2018-11-09 ENCOUNTER — Ambulatory Visit (INDEPENDENT_AMBULATORY_CARE_PROVIDER_SITE_OTHER): Payer: 59 | Admitting: Primary Care

## 2018-11-09 ENCOUNTER — Other Ambulatory Visit: Payer: Self-pay

## 2018-11-09 ENCOUNTER — Encounter: Payer: Self-pay | Admitting: Primary Care

## 2018-11-09 DIAGNOSIS — G4733 Obstructive sleep apnea (adult) (pediatric): Secondary | ICD-10-CM

## 2018-11-09 NOTE — Patient Instructions (Addendum)
  Thank you for allowing me to speak with you today for your follow-up appointment regarding sleep apnea  Obstructive sleep apnea: - Referral to DME ([employer]) for new machine, renewal order for supplies and mask  - Continue CPAP at 9cm H20 - Aim to wear 4-6 hours or more EVERY night - Do not drive if experiencing excessive daytime fatigue or somnolence - Do not take sedating medication or drink alcohol in excess prior to bedtime as these can worsen your sleep apnea  Follow-up: - 6 months with NP

## 2018-11-09 NOTE — Progress Notes (Signed)
Virtual Visit via Telephone Note  I connected with Vincent Wilkerson on 11/09/18 at  1:30 PM EDT by telephone and verified that I am speaking with the correct person using two identifiers.   I discussed the limitations, risks, security and privacy concerns of performing an evaluation and management service by telephone and the availability of in person appointments. I also discussed with the patient that there may be a patient responsible charge related to this service. The patient expressed understanding and agreed to proceed.  Patient at home, agreeing to E-vist. Myself and Veronia Beets present on phone call. My nurse lisa to set up follow-up and mail AVS.  History of Present Illness: 63 year old male, never smoked. PMH significant for OSA, hypertension, obesity. Patient of Dr. Elsworth Soho, last seen on 01/01/18.   11/09/2018 Patient called today for annual Sleep apnea follow-up. He is doing well, states that he is 100% compliant with CPAP use. Download is unavailable and SD card unable to be accessed d/t E-vist. States that his CPAP does not have wifi. Got current machine from his father in law 3 years ago, he is unsure how old it is. No issue with pressure setting, currently set at 9cm H20. He has gone back to full face mask from nasal pillows. Feels he breaths easier with full face but does struggle with increased humidity with mask during the summer. States that he try's to turn down the humidity setting on his device but knob is broken.Takes Nuvigil M-F. Helps productivity at work. 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 He does not need refill of medication today.   Observations/Objective:  No sob, wheezing or cough observed during phone call  Assessment and Plan:  Sleep apnea: - Unable to pull download because patient's CPAP machine does not having wifi. Unable to access SD card d/t nature of E-visit.  - Reports 100% compliance with CPAP use. No  issues with pressure setting current at [place] to use Nuvigil M-F with reported benefit  - Needs new machine and renew supplies/mask order  - FU in 6 months, requesting to see NP d/t convenience of making an appointment   Follow Up Instructions:  - 6 month follow up with NP    I discussed the assessment and treatment plan with the patient. The patient was provided an opportunity to ask questions and all were answered. The patient agreed with the plan and demonstrated an understanding of the instructions.   The patient was advised to call back or seek an in-person evaluation if the symptoms worsen or if the condition fails to improve as anticipated.  I provided 30 minutes of non-face-to-face time during this encounter.   Martyn Ehrich, NP

## 2018-11-26 ENCOUNTER — Telehealth: Payer: Self-pay | Admitting: Family Medicine

## 2018-11-26 NOTE — Telephone Encounter (Signed)
Copied from [place] [id number]. Topic: Quick Communication - See Telephone Encounter >> Nov 26, 2018  5:39 PM Blase Mess A wrote: CRM for notification. See Telephone encounter for: 11/26/18.  Patient is calling back to reschedule his appt.

## 2018-11-27 NOTE — Telephone Encounter (Signed)
See note

## 2018-11-28 ENCOUNTER — Ambulatory Visit: Payer: 59

## 2018-12-18 ENCOUNTER — Ambulatory Visit: Payer: 59 | Admitting: Pulmonary Disease

## 2019-01-08 ENCOUNTER — Encounter: Payer: Self-pay | Admitting: Gastroenterology

## 2019-01-23 ENCOUNTER — Other Ambulatory Visit: Payer: Self-pay

## 2019-01-23 ENCOUNTER — Encounter (INDEPENDENT_AMBULATORY_CARE_PROVIDER_SITE_OTHER): Payer: 59 | Admitting: Family Medicine

## 2019-01-23 ENCOUNTER — Ambulatory Visit: Payer: 59 | Admitting: Family Medicine

## 2019-01-23 DIAGNOSIS — Z23 Encounter for immunization: Secondary | ICD-10-CM

## 2019-01-23 NOTE — Progress Notes (Signed)
Per orders of Dr. Yong Channel, injection of 2nd Shingrix  given by Taiki Buckwalter L Lea Baine in left deltoid. Patient tolerated injection well.

## 2019-01-28 ENCOUNTER — Encounter: Payer: Self-pay | Admitting: Gastroenterology

## 2019-02-13 ENCOUNTER — Ambulatory Visit (AMBULATORY_SURGERY_CENTER): Payer: Self-pay

## 2019-02-13 ENCOUNTER — Other Ambulatory Visit: Payer: Self-pay

## 2019-02-13 VITALS — Ht 70.0 in | Wt 205.0 lb

## 2019-02-13 DIAGNOSIS — Z1211 Encounter for screening for malignant neoplasm of colon: Secondary | ICD-10-CM

## 2019-02-13 MED ORDER — SUPREP BOWEL PREP KIT 17.5-3.13-1.6 GM/177ML PO SOLN: 1.0000 | Freq: Once | ORAL | 0 refills | Status: AC

## 2019-02-13 NOTE — Progress Notes (Signed)
Per pt, no allergies to soy or egg products.Pt not taking any weight loss meds or using  O2 at home. Pt denies sedation problems.  Pt refused emmi video.  The PV was done over the phone due to COVID-19. Verified the pt's address and insurance. Reviewed medical hx and prep instructions and will mail paperwork to the pt. Informed pt to call with any questions or changes prior to his procedure. Pt understood.

## 2019-02-27 ENCOUNTER — Encounter: Payer: 59 | Admitting: Gastroenterology

## 2019-03-20 ENCOUNTER — Encounter: Payer: 59 | Admitting: Gastroenterology

## 2019-03-27 ENCOUNTER — Encounter: Payer: Self-pay | Admitting: Gastroenterology

## 2019-04-01 ENCOUNTER — Telehealth: Payer: Self-pay | Admitting: Gastroenterology

## 2019-04-01 NOTE — Telephone Encounter (Signed)
OK, thanks for the heads up.

## 2019-04-02 ENCOUNTER — Encounter: Payer: 59 | Admitting: Gastroenterology

## 2019-04-11 ENCOUNTER — Other Ambulatory Visit: Payer: Self-pay | Admitting: Family Medicine

## 2019-04-24 ENCOUNTER — Ambulatory Visit: Payer: 59 | Admitting: Primary Care

## 2019-04-24 ENCOUNTER — Other Ambulatory Visit: Payer: Self-pay

## 2019-04-24 ENCOUNTER — Encounter: Payer: Self-pay | Admitting: Primary Care

## 2019-04-24 DIAGNOSIS — G4733 Obstructive sleep apnea (adult) (pediatric): Secondary | ICD-10-CM | POA: Diagnosis not present

## 2019-04-24 DIAGNOSIS — G4710 Hypersomnia, unspecified: Secondary | ICD-10-CM | POA: Insufficient documentation

## 2019-04-24 MED ORDER — ARMODAFINIL 150 MG PO TABS: 150.0000 mg | ORAL_TABLET | Freq: Every day | ORAL | 3 refills | Status: DC

## 2019-04-24 NOTE — Patient Instructions (Addendum)
No changes today  Continue to wear CPAP mask every night for goal 4-6 hours or more  Do not drive if experience excessive daytime fatigue/somolence  Nuvigil refill sent   Follow up in 1 year with Dr. Elsworth Soho

## 2019-04-24 NOTE — Progress Notes (Signed)
@Patient  ID: Vincent Wilkerson, male    DOB: 30-Dec-1955, 63 y.o.   MRN: [id number]  Chief Complaint  Patient presents with  . Follow-up    Referring provider: Marin Olp, MD  HPI: 63 year old male, never smoked. PMH significant for OSA, hypertension, obesity. Patient of Dr. Elsworth Soho. PSG  In [place] 08/2001- AHI of 14.9 events per hour. For supine sleep the AHI was 53.4 events per hour. Titration >> CPAP  9 cm. Maintained on CPAP at 9cm H20 and Nuvigil for hypersomnia with sleep apnea.   Previous [facility] pulmonary encounter: 11/09/2018 Patient called today for annual Sleep apnea follow-up. He is doing well, states that he is 100% compliant with CPAP use. Download is unavailable and SD card unable to be accessed d/t E-vist. States that his CPAP does not have wifi. Got current machine from his father in law 3 years ago, he is unsure how old it is. No issue with pressure setting, currently set at 9cm H20. He has gone back to full face mask from nasal pillows. Feels he breaths easier with full face but does struggle with increased humidity with mask during the summer. States that he try's to turn down the humidity setting on his device but knob is broken.Takes Nuvigil M-F. Helps productivity at work. 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 He does not need refill of medication today.   04/24/2019 Patient presents today for 6 month follow-up. He is doing well. Received a new machine and airview shows 100% compliance with CPAP. No issues with mask fit or pressure setting. Continues to struggle with wearing He has had some difficulty with DME company [employer]. Reports less snoring and has stopped falling asleep at work. He is taking Nuvigil daily M-F and not always on the weekends.   Airview download 8/10-04/23/19: Usage days 30/30 days; 100% > 4 hours Average usage 6 hours 32 mins Pressure 9cm H20 Min air leaks AHI 0.2   No Known Allergies   Immunization History  Administered Date(s) Administered  . Influenza Split 04/26/2012, 06/07/2017  . Influenza Whole 04/09/2010  . Influenza,inj,Quad PF,6+ Mos 06/24/2014  . Influenza-Unspecified 05/16/2015, 05/30/2018  . Td 08/30/2005  . Tdap 12/14/2015  . Zoster Recombinat (Shingrix) 08/27/2018, 01/23/2019    Past Medical History:  Diagnosis Date  . Depression   . Hiccups    in the past/  Took Haloperidol to help  . Hyperlipidemia   . Hypertension   . Pneumonia    as a baby  . Sleep apnea     Tobacco History: Social History   Tobacco Use  Smoking Status Never Smoker  Smokeless Tobacco Never Used   Counseling given: Not Answered   Outpatient Medications Prior to Visit  Medication Sig Dispense Refill  . lisinopril-hydrochlorothiazide (ZESTORETIC) 10-12.5 MG tablet TAKE 1 TABLET BY MOUTH  DAILY 90 tablet 3  . Multiple Vitamin (MULTIVITAMIN) tablet Take 1 tablet by mouth daily. Nature Made for men-Take one daily    . Multiple Vitamins-Minerals (AIRBORNE PO) Take by mouth daily.    . simvastatin (ZOCOR) 40 MG tablet TAKE 1 TABLET BY MOUTH AT  BEDTIME 90 tablet 3  . Armodafinil (NUVIGIL) 150 MG tablet Take 1 tablet (150 mg total) by mouth daily. 90 tablet 1   No facility-administered medications prior to visit.     Review of Systems  Review of Systems  Constitutional: Negative.   Respiratory: Negative.   Cardiovascular: Negative.  Psychiatric/Behavioral: Negative.     Physical Exam  BP 120/80 (BP Location: Left Arm, Cuff Size: Large)   Pulse 87   Temp 98 F (36.7 C) (Oral)   Ht 5\' 10"  (1.778 m)   Wt 209 lb (94.8 kg)   SpO2 97%   BMI 29.99 kg/m  Physical Exam Constitutional:      Appearance: Normal appearance. He is well-developed. He is not ill-appearing.  HENT:     Head: Normocephalic and atraumatic.  Eyes:     Pupils: Pupils are equal, round, and reactive to light.  Neck:     Musculoskeletal: Normal range of motion and neck supple.   Cardiovascular:     Rate and Rhythm: Normal rate and regular rhythm.     Heart sounds: Normal heart sounds.  Pulmonary:     Effort: Pulmonary effort is normal. No respiratory distress.     Breath sounds: Normal breath sounds. No wheezing.  Skin:    General: Skin is warm and dry.     Findings: No erythema or rash.  Neurological:     General: No focal deficit present.     Mental Status: He is alert and oriented to person, place, and time. Mental status is at baseline.  Psychiatric:        Mood and Affect: Mood normal.        Behavior: Behavior normal.        Thought Content: Thought content normal.        Judgment: Judgment normal.      Lab Results:  CBC    Component Value Date/Time   WBC 7.2 10/01/2018 0815   RBC 5.41 10/01/2018 0815   HGB 17.3 (H) 10/01/2018 0815   HCT 49.4 10/01/2018 0815   PLT 245.0 10/01/2018 0815   MCV 91.2 10/01/2018 0815   MCHC 35.1 10/01/2018 0815   RDW 13.2 10/01/2018 0815   LYMPHSABS 2.7 02/29/2016 0831   MONOABS 0.4 02/29/2016 0831   EOSABS 0.3 02/29/2016 0831   BASOSABS 0.1 02/29/2016 0831    BMET    Component Value Date/Time   NA 137 10/01/2018 0815   K 4.3 10/01/2018 0815   CL 99 10/01/2018 0815   CO2 30 10/01/2018 0815   GLUCOSE 86 10/01/2018 0815   BUN 17 10/01/2018 0815   CREATININE 0.96 10/01/2018 0815   CALCIUM 9.6 10/01/2018 0815   GFRNONAA 84.37 03/25/2010 0844   GFRAA 92 12/06/2007 0945    BNP No results found for: BNP  ProBNP No results found for: PROBNP  Imaging: No results found.   Assessment & Plan:   Sleep apnea with hypersomnolence - Not falling asleep at work - Taking M-F daily - PMP reviewed, no unexpected prescriptions found - Nuvigil refill sent    OSA (obstructive sleep apnea) - Compliant with CPAP and reports benefit from use  - Pressure 9cm H20; AHI 0.2 - No changes today - Continue to wear CPAP mask every night for goal 4-6 hours or more - Do not drive if experience excessive daytime  fatigue/somolence - Enc weight loss - Follow up in 1 year with Dr. Candise Che, NP 04/24/2019

## 2019-04-24 NOTE — Assessment & Plan Note (Addendum)
-   Not falling asleep at work - Taking M-F daily - PMP reviewed, no unexpected prescriptions found - Nuvigil refill sent

## 2019-04-24 NOTE — Assessment & Plan Note (Deleted)
-   Compliant with CPAP and reports benefit from use  - Pressure 9cm H20; AHI 0.2 - No changes today - Continue to wear CPAP mask every night for goal 4-6 hours or more - Do not drive if experience excessive daytime fatigue/somolence - Enc weight loss - Follow up in 1 year with Dr. Elsworth Soho

## 2019-04-24 NOTE — Assessment & Plan Note (Signed)
-   Compliant with CPAP and reports benefit from use  - Pressure 9cm H20; AHI 0.2 - No changes today - Continue to wear CPAP mask every night for goal 4-6 hours or more - Do not drive if experience excessive daytime fatigue/somolence - Enc weight loss - Follow up in 1 year with Dr. Elsworth Soho

## 2019-04-29 ENCOUNTER — Ambulatory Visit (AMBULATORY_SURGERY_CENTER): Payer: 59 | Admitting: Gastroenterology

## 2019-04-29 ENCOUNTER — Encounter: Payer: Self-pay | Admitting: Gastroenterology

## 2019-04-29 ENCOUNTER — Other Ambulatory Visit: Payer: Self-pay

## 2019-04-29 VITALS — BP 143/81 | HR 73 | Temp 98.1°F | Resp 17 | Ht 70.0 in | Wt 205.0 lb

## 2019-04-29 DIAGNOSIS — K635 Polyp of colon: Secondary | ICD-10-CM | POA: Diagnosis not present

## 2019-04-29 DIAGNOSIS — K5730 Diverticulosis of large intestine without perforation or abscess without bleeding: Secondary | ICD-10-CM

## 2019-04-29 DIAGNOSIS — Z1211 Encounter for screening for malignant neoplasm of colon: Secondary | ICD-10-CM

## 2019-04-29 MED ORDER — SODIUM CHLORIDE 0.9 % IV SOLN: 500.0000 mL | Freq: Once | INTRAVENOUS | Status: DC

## 2019-04-29 NOTE — Patient Instructions (Signed)
YOU HAD AN ENDOSCOPIC PROCEDURE TODAY AT THE [facility]:   Refer to the procedure report that was given to you for any specific questions about what was found during the examination.  If the procedure report does not answer your questions, please call your gastroenterologist to clarify.  If you requested that your care partner not be given the details of your procedure findings, then the procedure report has been included in a sealed envelope for you to review at your convenience later.  YOU SHOULD EXPECT: Some feelings of bloating in the abdomen. Passage of more gas than usual.  Walking can help get rid of the air that was put into your GI tract during the procedure and reduce the bloating. If you had a lower endoscopy (such as a colonoscopy or flexible sigmoidoscopy) you may notice spotting of blood in your stool or on the toilet paper. If you underwent a bowel prep for your procedure, you may not have a normal bowel movement for a few days.  Please Note:  You might notice some irritation and congestion in your nose or some drainage.  This is from the oxygen used during your procedure.  There is no need for concern and it should clear up in a day or so.  SYMPTOMS TO REPORT IMMEDIATELY:   Following lower endoscopy (colonoscopy or flexible sigmoidoscopy):  Excessive amounts of blood in the stool  Significant tenderness or worsening of abdominal pains  Swelling of the abdomen that is new, acute  Fever of 100F or higher   For urgent or emergent issues, a gastroenterologist can be reached at any hour by calling [phone].   DIET:  We do recommend a small meal at first, but then you may proceed to your regular diet.  Drink plenty of fluids but you should avoid alcoholic beverages for 24 hours.  ACTIVITY:  You should plan to take it easy for the rest of today and you should NOT DRIVE or use heavy machinery until tomorrow (because of the sedation medicines used during the test).     FOLLOW UP: Our staff will call the number listed on your records 48-72 hours following your procedure to check on you and address any questions or concerns that you may have regarding the information given to you following your procedure. If we do not reach you, we will leave a message.  We will attempt to reach you two times.  During this call, we will ask if you have developed any symptoms of COVID 19. If you develop any symptoms (ie: fever, flu-like symptoms, shortness of breath, cough etc.) before then, please call [phone].  If you test positive for Covid 19 in the 2 weeks post procedure, please call and report this information to us.    If any biopsies were taken you will be contacted by phone or by letter within the next 1-3 weeks.  Please call us at [phone] if you have not heard about the biopsies in 3 weeks.    SIGNATURES/CONFIDENTIALITY: You and/or your care partner have signed paperwork which will be entered into your electronic medical record.  These signatures attest to the fact that that the information above on your After Visit Summary has been reviewed and is understood.  Full responsibility of the confidentiality of this discharge information lies with you and/or your care-partner.    Handouts were given to you on polyps and diverticulosis. You may resume your current medications today. Await biopsy results. Please call if any questions   or concerns.   

## 2019-04-29 NOTE — Progress Notes (Signed)
Report to PACU, RN, vss, BBS= Clear.  

## 2019-04-29 NOTE — Progress Notes (Signed)
No problems noted in the recovery room. maw 

## 2019-04-29 NOTE — Op Note (Signed)
[facility] Patient Name: Vincent Wilkerson Procedure Date: 04/29/2019 8:36 AM MRN: [id number] Endoscopist: Gerrit Heck , MD Age: 63 Referring MD:  Date of Birth: [birth year] Gender: Male Account #: [id number] Procedure:                Colonoscopy Indications:              Screening for colorectal malignant neoplasm (last                            colonoscopy was 10 years ago) Medicines:                Monitored Anesthesia Care Procedure:                Pre-Anesthesia Assessment:                           - Prior to the procedure, a History and Physical                            was performed, and patient medications and                            allergies were reviewed. The patient's tolerance of                            previous anesthesia was also reviewed. The risks                            and benefits of the procedure and the sedation                            options and risks were discussed with the patient.                            All questions were answered, and informed consent                            was obtained. Prior Anticoagulants: The patient has                            taken no previous anticoagulant or antiplatelet                            agents. ASA Grade Assessment: II - A patient with                            mild systemic disease. After reviewing the risks                            and benefits, the patient was deemed in                            satisfactory condition to undergo the procedure.  After obtaining informed consent, the colonoscope                            was passed under direct vision. Throughout the                            procedure, the patient's blood pressure, pulse, and                            oxygen saturations were monitored continuously. The                            Colonoscope was introduced through the anus and                            advanced to the the terminal ileum.  The colonoscopy                            was performed without difficulty. The patient                            tolerated the procedure well. The quality of the                            bowel preparation was adequate. The terminal ileum,                            ileocecal valve, appendiceal orifice, and rectum                            were photographed. Scope In: 8:55:37 AM Scope Out: 9:07:24 AM Scope Withdrawal Time: 0 hours 10 minutes 27 seconds  Total Procedure Duration: 0 hours 11 minutes 47 seconds  Findings:                 The perianal and digital rectal examinations were                            normal.                           A 3 mm polyp was found in the hepatic flexure. The                            polyp was sessile. The polyp was removed with a                            cold snare. Resection and retrieval were complete.                            Estimated blood loss was minimal.                           A few small-mouthed diverticula were found in the  sigmoid colon.                           The exam was otherwise normal throughout the                            remainder of the colon.                           Retroflexion in the right colon was performed.                           The retroflexed view of the distal rectum and anal                            verge was normal and showed no anal or rectal                            abnormalities.                           The terminal ileum appeared normal. Complications:            No immediate complications. Estimated Blood Loss:     Estimated blood loss was minimal. Impression:               - One 3 mm polyp at the hepatic flexure, removed                            with a cold snare. Resected and retrieved.                           - Diverticulosis in the sigmoid colon.                           - The distal rectum and anal verge are normal on                             retroflexion view.                           - The examined portion of the ileum was normal. Recommendation:           - Patient has a contact number available for                            emergencies. The signs and symptoms of potential                            delayed complications were discussed with the                            patient. Return to normal activities tomorrow.                            Written discharge instructions were provided to  the                            patient.                           - Resume previous diet.                           - Continue present medications.                           - Await pathology results.                           - Repeat colonoscopy in 7-10 years for surveillance                            based on pathology results.                           - Return to GI office PRN. Gerrit Heck, MD 04/29/2019 9:11:44 AM

## 2019-04-29 NOTE — Progress Notes (Signed)
Pt's states no medical or surgical changes since previsit or office visit. Sm vitals, IV and JB temps

## 2019-04-29 NOTE — Progress Notes (Signed)
Called to room to assist during endoscopic procedure.  Patient ID and intended procedure confirmed with present staff. Received instructions for my participation in the procedure from the performing physician.  

## 2019-05-01 ENCOUNTER — Telehealth: Payer: Self-pay

## 2019-05-01 NOTE — Telephone Encounter (Signed)
  Follow up Call-  Call back number 04/29/2019  Post procedure Call Back phone  # [phone]  Permission to leave phone message Yes  Some recent data might be hidden     Patient questions:  Do you have a fever, pain , or abdominal swelling? No. Pain Score  0 *  Have you tolerated food without any problems? Yes.    Have you been able to return to your normal activities? Yes.    Do you have any questions about your discharge instructions: Diet   No. Medications  No. Follow up visit  No.  Do you have questions or concerns about your Care? No.  Actions: * If pain score is 4 or above: 1. No action needed, pain <4.Have you developed a fever since your procedure? no  2.   Have you had an respiratory symptoms (SOB or cough) since your procedure? no  3.   Have you tested positive for COVID 19 since your procedure no  4.   Have you had any family members/close contacts diagnosed with the COVID 19 since your procedure?  no   If yes to any of these questions please route to Joylene John, RN and Alphonsa Gin, Therapist, sports.

## 2019-05-06 ENCOUNTER — Encounter: Payer: Self-pay | Admitting: Gastroenterology

## 2019-05-20 ENCOUNTER — Encounter: Payer: Self-pay | Admitting: Family Medicine

## 2019-05-20 ENCOUNTER — Ambulatory Visit (INDEPENDENT_AMBULATORY_CARE_PROVIDER_SITE_OTHER): Payer: 59

## 2019-05-20 ENCOUNTER — Other Ambulatory Visit: Payer: Self-pay

## 2019-05-20 DIAGNOSIS — Z23 Encounter for immunization: Secondary | ICD-10-CM

## 2019-09-02 ENCOUNTER — Encounter: Payer: 59 | Admitting: Family Medicine

## 2019-10-01 ENCOUNTER — Other Ambulatory Visit: Payer: Self-pay

## 2019-10-01 ENCOUNTER — Encounter: Payer: Self-pay | Admitting: Family Medicine

## 2019-10-01 ENCOUNTER — Ambulatory Visit (INDEPENDENT_AMBULATORY_CARE_PROVIDER_SITE_OTHER): Payer: 59 | Admitting: Family Medicine

## 2019-10-01 VITALS — BP 112/70 | HR 74 | Temp 98.3°F | Ht 70.0 in | Wt 209.4 lb

## 2019-10-01 DIAGNOSIS — Z125 Encounter for screening for malignant neoplasm of prostate: Secondary | ICD-10-CM | POA: Diagnosis not present

## 2019-10-01 DIAGNOSIS — G4730 Sleep apnea, unspecified: Secondary | ICD-10-CM

## 2019-10-01 DIAGNOSIS — Z0000 Encounter for general adult medical examination without abnormal findings: Secondary | ICD-10-CM

## 2019-10-01 DIAGNOSIS — G4710 Hypersomnia, unspecified: Secondary | ICD-10-CM

## 2019-10-01 DIAGNOSIS — Z8709 Personal history of other diseases of the respiratory system: Secondary | ICD-10-CM

## 2019-10-01 DIAGNOSIS — I10 Essential (primary) hypertension: Secondary | ICD-10-CM | POA: Diagnosis not present

## 2019-10-01 DIAGNOSIS — E785 Hyperlipidemia, unspecified: Secondary | ICD-10-CM

## 2019-10-01 DIAGNOSIS — Z87898 Personal history of other specified conditions: Secondary | ICD-10-CM

## 2019-10-01 LAB — CBC WITH DIFFERENTIAL/PLATELET
Basophils Absolute: 0.1 10*3/uL (ref 0.0–0.1)
Basophils Relative: 0.7 % (ref 0.0–3.0)
Eosinophils Absolute: 0.2 10*3/uL (ref 0.0–0.7)
Eosinophils Relative: 2.3 % (ref 0.0–5.0)
HCT: 49 % (ref 39.0–52.0)
Hemoglobin: 16.9 g/dL (ref 13.0–17.0)
Lymphocytes Relative: 33.1 % (ref 12.0–46.0)
Lymphs Abs: 2.4 10*3/uL (ref 0.7–4.0)
MCHC: 34.5 g/dL (ref 30.0–36.0)
MCV: 93.1 fl (ref 78.0–100.0)
Monocytes Absolute: 0.6 10*3/uL (ref 0.1–1.0)
Monocytes Relative: 8.2 % (ref 3.0–12.0)
Neutro Abs: 4.1 10*3/uL (ref 1.4–7.7)
Neutrophils Relative %: 55.7 % (ref 43.0–77.0)
Platelets: 272 10*3/uL (ref 150.0–400.0)
RBC: 5.26 Mil/uL (ref 4.22–5.81)
RDW: 12.8 % (ref 11.5–15.5)
WBC: 7.3 10*3/uL (ref 4.0–10.5)

## 2019-10-01 LAB — LIPID PANEL
Cholesterol: 202 mg/dL — ABNORMAL HIGH (ref 0–200)
HDL: 58 mg/dL (ref >39.00)
LDL Cholesterol: 129 mg/dL — ABNORMAL HIGH (ref 0–99)
NonHDL: 144.16
Total CHOL/HDL Ratio: 3
Triglycerides: 75 mg/dL (ref 0.0–149.0)
VLDL: 15 mg/dL (ref 0.0–40.0)

## 2019-10-01 LAB — COMPREHENSIVE METABOLIC PANEL
ALT: 28 U/L (ref 0–53)
AST: 25 U/L (ref 0–37)
Albumin: 4.6 g/dL (ref 3.5–5.2)
Alkaline Phosphatase: 74 U/L (ref 39–117)
BUN: 15 mg/dL (ref 6–23)
CO2: 29 mEq/L (ref 19–32)
Calcium: 9.9 mg/dL (ref 8.4–10.5)
Chloride: 103 mEq/L (ref 96–112)
Creatinine, Ser: 0.92 mg/dL (ref 0.40–1.50)
GFR: 83.49 mL/min (ref >60.00)
Glucose, Bld: 94 mg/dL (ref 70–99)
Potassium: 4.5 mEq/L (ref 3.5–5.1)
Sodium: 139 mEq/L (ref 135–145)
Total Bilirubin: 0.6 mg/dL (ref 0.2–1.2)
Total Protein: 6.7 g/dL (ref 6.0–8.3)

## 2019-10-01 LAB — SARS-COV-2 IGG: SARS-COV-2 IgG: 0.02

## 2019-10-01 LAB — PSA: PSA: 1.74 ng/mL (ref 0.10–4.00)

## 2019-10-01 MED ORDER — SIMVASTATIN 40 MG PO TABS: 40.0000 mg | ORAL_TABLET | Freq: Every day | ORAL | 3 refills | Status: DC

## 2019-10-01 MED ORDER — LISINOPRIL-HYDROCHLOROTHIAZIDE 10-12.5 MG PO TABS: 1.0000 | ORAL_TABLET | Freq: Every day | ORAL | 3 refills | Status: DC

## 2019-10-01 NOTE — Patient Instructions (Addendum)
Goal blood pressure on average <140/90. If we are going to stay on yearly schedule would love if you would keep an eye on this 1-2x a month and make sure in this range  [place] job being down 4 lbs- keep up the steady progress  Please stop by lab before you go If you do not have mychart- we will call you about results within 5 business days of [place] receiving them.  If you have mychart- we will send your results within 3 business days of [place] receiving them.  If abnormal or we want to clarify a result, we will call or mychart you to make sure you receive the message.  If you have questions or concerns or don't hear within 5-7 days, please send [place] a message or call us.

## 2019-10-01 NOTE — Progress Notes (Signed)
Phone: [phone]   Subjective:  Patient presents today for their annual physical. Chief complaint-noted.   See problem oriented charting- ROS- full  review of systems was completed and negative  except for: ear ringing, bump on left foot  The following were reviewed and entered/updated in epic: Past Medical History:  Diagnosis Date  . Depression   . Hiccups    in the past/  Took Haloperidol to help  . Hyperlipidemia   . Hypertension   . Pneumonia    as a baby  . Sleep apnea    Patient Active Problem List   Diagnosis Date Noted  . Sleep apnea with hypersomnolence 04/24/2019    Priority: High  . Essential hypertension 08/31/2015    Priority: Medium  . Hyperlipidemia 05/11/2007    Priority: Medium  . OSA (obstructive sleep apnea) 05/11/2007    Priority: Medium  . Right calf pain 06/07/2018    Priority: Low  . Obese 04/15/2015    Priority: Low  . Right hip pain 01/19/2015    Priority: Low  . History of basal cell cancer 05/10/2010    Priority: Low   Past Surgical History:  Procedure Laterality Date  . COLONOSCOPY    . none     no past surgeries    Family History  Problem Relation Age of Onset  . Alcohol abuse Sister   . Depression Mother   . Alcohol abuse Mother   . COPD Mother   . Gallbladder disease Father   . Heart disease Father        CHF- died from this  . Polycythemia Father   . Colon cancer Neg Hx   . Rectal cancer Neg Hx     Medications- reviewed and updated Current Outpatient Medications  Medication Sig Dispense Refill  . Armodafinil (NUVIGIL) 150 MG tablet Take 1 tablet (150 mg total) by mouth daily. 90 tablet 3  . lisinopril-hydrochlorothiazide (ZESTORETIC) 10-12.5 MG tablet TAKE 1 TABLET BY MOUTH  DAILY 90 tablet 3  . Multiple Vitamin (MULTIVITAMIN) tablet Take 1 tablet by mouth daily. Nature Made for men-Take one daily    . Multiple Vitamins-Minerals (AIRBORNE PO) Take by mouth daily.    . simvastatin (ZOCOR) 40 MG tablet TAKE 1  TABLET BY MOUTH AT  BEDTIME 90 tablet 3   No current facility-administered medications for this visit.    Allergies-reviewed and updated No Known Allergies  Social History   Social History Narrative   Married 2001. 2 stepchildren. Both children graduating by 2017 from college- oldest in [place] and younger child Al working on finding different job- with [employer] currently.  1 dog- lab collie mix.       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 TM normal. Bridge of nose normal. Eyelids normal.  Neck: no thyromegaly or cervical lymphadenopathy  CV: RRR no murmurs rubs or gallops Lungs: CTAB no crackles, wheeze, rhonchi Abdomen: soft/nontender/nondistended/normal bowel sounds. No rebound or guarding.  Ext: no edema Skin: warm, dry Neuro: grossly normal, moves all extremities, PERRLA    Assessment and Plan  64 y.o. male presenting for  annual physical.  Health Maintenance counseling: 1. Anticipatory guidance: Patient counseled regarding regular dental exams yes q6 months, eye exams - tries to go yearly- no major issues but vision can be affected but prolonged staring at screen and bad lighting,  avoiding smoking and second hand smoke yes, limiting alcohol to 0 beverages per day .   2. Risk factor reduction:  Advised patient of need for regular exercise and diet rich and fruits and vegetables to reduce risk of heart attack and stroke. Exercise- walks 4-42mi a week with his dog. Diet-congratulated patient on losing 4 pounds over last year-particularly impressive considering COVID-19- encouraged continued gradual weight loss.  Wt Readings from Last 3 Encounters:  10/01/19 209 lb 6.4 oz (95 kg)  04/29/19  205 lb (93 kg)  04/24/19 209 lb (94.8 kg)  3. Immunizations/screenings/ancillary studies-fully up-to-date other than COVID-19 vaccination-he will consider when open his phase  Immunization History  Administered Date(s) Administered  . Influenza Split 04/26/2012, 06/07/2017  . Influenza Whole 04/09/2010  . Influenza,inj,Quad PF,6+ Mos 06/24/2014, 05/20/2019  . Influenza-Unspecified 05/16/2015, 05/30/2018  . Td 08/30/2005  . Tdap 12/14/2015  . Zoster Recombinat (Shingrix) 08/27/2018, 01/23/2019  4. Prostate cancer screening- PSA trending up slightly over the last few years.  Update PSA trend today. Lab Results  Component Value Date   PSA 2.17 10/01/2018   PSA 2.06 03/06/2017   PSA 1.60 02/29/2016   5. Colon cancer screening - colonoscopy April 29, 2019 with 10-year repeat planned 6. Skin cancer screening-history of basal cell skin cancer in 2014.  Follows with dermatology as needed- encouraged follow up. advised regular sunscreen use. Denies worrisome, changing, or new skin lesions.  7. Never smoker  Status of chronic or acute concerns   Ear ringing on and off - does better after irrigation in past. Debrox in past.   bump on left foot - bony prominence at base of Metatarsal- does better with wide shoes but hard to find his size- hes going to focus on looking for these. Could refer to podiatry if needed- he will call if decides to do this.   Patient felt ill early last year. He wonders if he had covid- we will check antibodies though we discussed even if he did have covid possible antibodies would not persist this long.   Hyperlipidemia, unspecified hyperlipidemia type - Plan: CBC with Differential/Platelet, Comprehensive metabolic panel, Lipid panel-compliant with simvastatin-we will update a panel today  Essential hypertension-well-controlled on lisinopril hydrochlorothiazide 10-12.5 mg.  Sleep apnea with hypersomnolence.  Patient now on CPAP and also on medication Nuvigil- he is  sleeping well. So clean santizer has been helpful.  - bad experience with [facility]- having issues with [employer]  Recommended follow up:  1 year physical   Lab/Order associations: fasting   ICD-10-CM   1. Preventative health care  Z00.00 CBC with Differential/Platelet    Comprehensive metabolic panel    Lipid panel    PSA  2. Hyperlipidemia, unspecified hyperlipidemia type  E78.5 CBC with Differential/Platelet    Comprehensive metabolic panel    Lipid panel  3. Essential hypertension  I10   4. Sleep apnea with hypersomnolence  G47.10    G47.30   5. Screening for prostate cancer  Z12.5 PSA  6. History of fever  Z87.898 SARS-COV-2 IgG  7. History of persistent cough  Z87.09 SARS-COV-2 IgG    No orders of the defined types were placed in this encounter.   Return precautions advised.  Garret Reddish, MD

## 2019-10-04 ENCOUNTER — Other Ambulatory Visit: Payer: Self-pay

## 2019-10-04 MED ORDER — ROSUVASTATIN CALCIUM 20 MG PO TABS: 20.0000 mg | ORAL_TABLET | Freq: Every day | ORAL | 3 refills | Status: DC

## 2019-10-07 ENCOUNTER — Telehealth: Payer: Self-pay

## 2019-10-07 ENCOUNTER — Other Ambulatory Visit: Payer: Self-pay

## 2019-10-07 NOTE — Telephone Encounter (Signed)
[name] [place] RECORD [facility] Patient Name: Vincent Wilkerson Gender: Male DOB: Mar 30, 1956 Age: 64 Y 9 M 18 D Return Phone Number: [phone] (Primary) Address: City/State/Zip: [place] [address] Client [facility] [place] [facility] Physician Garret Reddish- MD Contact Type Call Who Is Calling Patient / Member / Family / Caregiver Call Type Triage / Clinical Relationship To Patient Self Return Phone Number [phone] (Primary) Chief Complaint Ears ringing Reason for Call Symptomatic / Request for Health Information Initial Comment Call sent from [place] at the office. Caller states he had ringing in his ears so bad last night that he could not sleep. This has been going on for a long time but was worse last night. He reported it at his physical last week and his ears were checked. Using Debrox is not helping. [place] Not Listed [facility] on [address] Translation No Nurse Assessment Nurse: Reasor, RN, Leah Date/Time (Eastern Time): 10/07/2019 3:57:10 PM Confirm and document reason for call. If symptomatic, describe symptoms. ---Call sent from [place] at the office. Caller states he had ringing in his ears so bad last night that he could not sleep. Pt states this has been going on for a long time but was worse last night. Pt reported it at his physical last week and his ears were checked. Pt used Debrox to clean wax out, but it didn't help. No fever, no v/ d, no pain. Pt states he has some dizziness after using hydrogen peroxide, but that is abating Has the patient had close contact with a person known or suspected to have the novel coronavirus illness OR traveled / lives in area with major community spread (including international travel) in the last 14 days from the onset of symptoms? * If Asymptomatic, screen for exposure and travel within the  last 14 days. ---No Does the patient have any new or worsening symptoms? ---Yes Will a triage be completed? ---Yes Related visit to physician within the last 2 weeks? ---Yes Does the PT have any chronic conditions? (i.e. diabetes, asthma, this includes High risk factors for pregnancy, etc.) ---Yes List chronic conditions. ---high cholesterol HTN OSA Is this a behavioral health or substance abuse call? ---No PLEASE NOTE: All timestamps contained within this report are represented as [place] Standard Time. CONFIDENTIALTY NOTICE: This fax transmission is intended only for the addressee. It contains information that is legally privileged, confidential or otherwise protected from use or disclosure. If you are not the intended recipient, you are strictly prohibited from reviewing, disclosing, copying using or disseminating any of this information or taking any action in reliance on or regarding this information. If you have received this fax in error, please notify us immediately by telephone so that we can arrange for its return to [place]. Phone: [phone], Toll-Free: [phone], Fax: [phone] Page: 2 of 2 Call Id: [id number] Guidelines Guideline Title Affirmed Question Affirmed Notes Nurse Date/Time Eilene Ghazi Time) Tinnitus MODERATE-SEVERE tinnitus (i.e., interferes with work, school, or sleep) Reasor, Therapist, sports, Denny Peon 10/07/2019 4:02:00 PM Disp. Time Eilene Ghazi Time) Disposition Final User 10/07/2019 4:07:29 PM SEE PCP WITHIN 3 DAYS Yes Reasor, RN, Leah Caller Disagree/Comply Comply Caller Understands Yes PreDisposition Go to Urgent Care/Walk-In Clinic Care Advice Given Per Guideline SEE PCP WITHIN 3 DAYS: * You need to be seen within 2 or 3 days. Call your doctor (or NP/PA) during regular office hours and make an appointment. A clinic or urgent  care center are good places to go for care if your doctor's office is closed or you can't get an appointment. NOTE: If office will be open tomorrow, tell  caller to call then, not in 3 days. CALL BACK IF: * You become worse. CARE ADVICE given per Tinnitus (Adult) guideline.

## 2019-10-07 NOTE — Telephone Encounter (Signed)
That's fine

## 2019-10-07 NOTE — Telephone Encounter (Signed)
appt 10/14/19 at 1pm

## 2019-10-07 NOTE — Telephone Encounter (Signed)
Patient experiencing ringing in the ears. Patient states that he clean his ear out and he can still hear the ringing in his ears. Dr. Yong Channel is booked .The only thing that's available is same day appt. Can I place pt on one of these slots?

## 2019-10-08 ENCOUNTER — Encounter: Payer: Self-pay | Admitting: Physician Assistant

## 2019-10-08 ENCOUNTER — Ambulatory Visit (INDEPENDENT_AMBULATORY_CARE_PROVIDER_SITE_OTHER): Payer: 59 | Admitting: Physician Assistant

## 2019-10-08 VITALS — BP 122/70 | HR 74 | Temp 98.2°F | Ht 70.0 in | Wt 211.2 lb

## 2019-10-08 DIAGNOSIS — G4701 Insomnia due to medical condition: Secondary | ICD-10-CM | POA: Diagnosis not present

## 2019-10-08 DIAGNOSIS — H9313 Tinnitus, bilateral: Secondary | ICD-10-CM

## 2019-10-08 MED ORDER — TRAZODONE HCL 50 MG PO TABS: 25.0000 mg | ORAL_TABLET | Freq: Every evening | ORAL | 0 refills | Status: DC | PRN

## 2019-10-08 NOTE — Patient Instructions (Addendum)
It was great to see you!  1. May trial trazodone (this has been sent in) for your sleep -- start with 1/2 tablet and may trial full tablet the next night if needed. If ineffective, please let me know.  2. You will be contacted about your referral to the ENT.  Take care,  Inda Coke PA-C   Tinnitus Tinnitus refers to hearing a sound when there is no actual source for that sound. This is often described as ringing in the ears. However, people with this condition may hear a variety of noises, in one ear or in both ears. The sounds of tinnitus can be soft, loud, or somewhere in between. Tinnitus can last for a few seconds or can be constant for days. It may go away without treatment and come back at various times. When tinnitus is constant or happens often, it can lead to other problems, such as trouble sleeping and trouble concentrating. Almost everyone experiences tinnitus at some point. Tinnitus that is long-lasting (chronic) or comes back often (recurs) may require medical attention. What are the causes? The cause of tinnitus is often not known. In some cases, it can result from:  Exposure to loud noises from machinery, music, or other sources.  An object (foreign body) stuck in the ear.  Earwax buildup.  Drinking alcohol or caffeine.  Taking certain medicines.  Age-related hearing loss. It may also be caused by medical conditions such as:  Ear or sinus infections.  High blood pressure.  Heart diseases.  Anemia.  Allergies.  Meniere's disease.  Thyroid problems.  Tumors.  A weak, bulging blood vessel (aneurysm) near the ear. What are the signs or symptoms? The main symptom of tinnitus is hearing a sound when there is no source for that sound. It may sound like:  Buzzing.  Roaring.  Ringing.  Blowing air.  Hissing.  Whistling.  Sizzling.  Humming.  Running water.  A musical note.  Tapping. Symptoms may affect only one ear (unilateral) or  both ears (bilateral). How is this diagnosed? Tinnitus is diagnosed based on your symptoms, your medical history, and a physical exam. Your health care provider may do a thorough hearing test (audiologic exam) if your tinnitus:  Is unilateral.  Causes hearing difficulties.  Lasts 6 months or longer. You may work with a health care provider who specializes in hearing disorders (audiologist). You may be asked questions about your symptoms and how they affect your daily life. You may have other tests done, such as:  CT scan.  MRI.  An imaging test of how blood flows through your blood vessels (angiogram). How is this treated? Treating an underlying medical condition can sometimes make tinnitus go away. If your tinnitus continues, other treatments may include:  Medicines.  Therapy and counseling to help you manage the stress of living with tinnitus.  Sound generators to mask the tinnitus. These include: ? Tabletop sound machines that play relaxing sounds to help you fall asleep. ? Wearable devices that fit in your ear and play sounds or music. ? Acoustic neural stimulation. This involves using headphones to listen to music that contains an auditory signal. Over time, listening to this signal may change some pathways in your brain and make you less sensitive to tinnitus. This treatment is used for very severe cases when no other treatment is working.  Using hearing aids or cochlear implants if your tinnitus is related to hearing loss. Hearing aids are worn in the outer ear. Cochlear implants are surgically placed  in the inner ear. Follow these instructions at home: Managing symptoms      When possible, avoid being in loud places and being exposed to loud sounds.  Wear hearing protection, such as earplugs, when you are exposed to loud noises.  Use a white noise machine, a humidifier, or other devices to mask the sound of tinnitus.  Practice techniques for reducing stress, such as  meditation, yoga, or deep breathing. Work with your health care provider if you need help with managing stress.  Sleep with your head slightly raised. This may reduce the impact of tinnitus. General instructions  Do not use stimulants, such as nicotine, alcohol, or caffeine. Talk with your health care provider about other stimulants to avoid. Stimulants are substances that can make you feel alert and attentive by increasing certain activities in the body (such as heart rate and blood pressure). These substances may make tinnitus worse.  Take over-the-counter and prescription medicines only as told by your health care provider.  Try to get plenty of sleep each night.  Keep all follow-up visits as told by your health care provider. This is important. Contact a health care provider if:  Your tinnitus continues for 3 weeks or longer without stopping.  You develop sudden hearing loss.  Your symptoms get worse or do not get better with home care.  You feel you are not able to manage the stress of living with tinnitus. Get help right away if:  You develop tinnitus after a head injury.  You have tinnitus along with any of the following: ? Dizziness. ? Loss of balance. ? Nausea and vomiting. ? Sudden, severe headache. These symptoms may represent a serious problem that is an emergency. Do not wait to see if the symptoms will go away. Get medical help right away. Call your local emergency services (911 in the [place] Do not drive yourself to the hospital. Summary  Tinnitus refers to hearing a sound when there is no actual source for that sound. This is often described as ringing in the ears.  Symptoms may affect only one ear (unilateral) or both ears (bilateral).  Use a white noise machine, a humidifier, or other devices to mask the sound of tinnitus.  Do not use stimulants, such as nicotine, alcohol, or caffeine. Talk with your health care provider about other stimulants to avoid. These  substances may make tinnitus worse. This information is not intended to replace advice given to you by your health care provider. Make sure you discuss any questions you have with your health care provider. Document Revised: 02/13/2019 Document Reviewed: 05/11/2017 Elsevier Patient Education  2020 [employer].

## 2019-10-08 NOTE — Progress Notes (Signed)
Vincent Wilkerson is a 64 y.o. male here for a new problem.  I acted as a Education administrator for [employer], PA-C Anselmo Pickler, LPN  History of Present Illness:   Chief Complaint  Patient presents with  . Tinnitus    HPI   Tinnitus Pt c/o ringing in bilateral ears on and off past few years. Has increased in ringing since Sunday night, had a hard time sleeping last night and tonight. States that on Sunday his symptoms were the worst they have even been. Denies any prolonged loud noise exposure. He is 90% diligent with wearing ear protection.  Denies: double vision, unusual headaches, numbness/tingling, weakness on one side of the body, changes in hearing   Past Medical History:  Diagnosis Date  . Depression   . Hiccups    in the past/  Took Haloperidol to help  . Hyperlipidemia   . Hypertension   . Pneumonia    as a baby  . Sleep apnea      Social History   Socioeconomic History  . Marital status: Married    Spouse name: Not on file  . Number of children: Not on file  . Years of education: Not on file  . Highest education level: Not on file  Occupational History  . Not on file  Tobacco Use  . Smoking status: Never Smoker  . Smokeless tobacco: Never Used  Substance and Sexual Activity  . Alcohol use: Yes    Alcohol/week: 0.0 standard drinks    Comment: rare  . Drug use: No  . Sexual activity: Not on file  Other Topics Concern  . Not on file  Social History Narrative   Married 2001. 2 stepchildren. Both grown and married  In mid to late 20s. 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 Difficulty of Paying Living Expenses: Not on file  Food Insecurity:   . Worried About Charity fundraiser in the Last Year: Not on file  . Ran Out of Food in the Last Year: Not on file  Transportation Needs:   . Lack  of Transportation (Medical): Not on file  . Lack of Transportation (Non-Medical): Not on file  Physical Activity:   . Days of Exercise per Week: Not on file  . Minutes of Exercise per Session: Not on file  Stress:   . Feeling of Stress : Not on file  Social Connections:   . Frequency of Communication with Friends and Family: Not on file  . Frequency of Social Gatherings with Friends and Family: Not on file  . Attends Religious Services: Not on file  . Active Member of Clubs or Organizations: Not on file  . Attends Archivist Meetings: Not on file  . Marital Status: Not on file  Intimate Partner Violence:   . Fear of Current or Ex-Partner: Not on file  . Emotionally Abused: Not on file  . Physically Abused: Not on file  . Sexually Abused: Not on file    Past Surgical History:  Procedure Laterality Date  . COLONOSCOPY    . none     no past surgeries    Family History  Problem Relation Age of Onset  . Alcohol abuse Sister   . Depression Mother   . Alcohol abuse  Mother   . COPD Mother   . Gallbladder disease Father   . Heart disease Father        CHF- died from this  . Polycythemia Father   . Colon cancer Neg Hx   . Rectal cancer Neg Hx     No Known Allergies  Current Medications:   Current Outpatient Medications:  .  Armodafinil (NUVIGIL) 150 MG tablet, Take 1 tablet (150 mg total) by mouth daily., Disp: 90 tablet, Rfl: 3 .  lisinopril-hydrochlorothiazide (ZESTORETIC) 10-12.5 MG tablet, Take 1 tablet by mouth daily., Disp: 90 tablet, Rfl: 3 .  Multiple Vitamin (MULTIVITAMIN) tablet, Take 1 tablet by mouth daily. Nature Made for men-Take one daily, Disp: , Rfl:  .  Multiple Vitamins-Minerals (AIRBORNE PO), Take by mouth daily., Disp: , Rfl:  .  rosuvastatin (CRESTOR) 20 MG tablet, Take 1 tablet (20 mg total) by mouth daily., Disp: 90 tablet, Rfl: 3 .  traZODone (DESYREL) 50 MG tablet, Take 0.5-1 tablets (25-50 mg total) by mouth at bedtime as needed for  sleep., Disp: 30 tablet, Rfl: 0   Review of Systems:   ROS Negative unless otherwise specified per HPI.  Vitals:   Vitals:   10/08/19 1103  BP: 122/70  Pulse: 74  Temp: 98.2 F (36.8 C)  TempSrc: Temporal  SpO2: 98%  Weight: 211 lb 4 oz (95.8 kg)  Height: 5\' 10"  (1.778 m)     Body mass index is 30.31 kg/m.  Physical Exam:   Physical Exam Vitals and nursing note reviewed.  Constitutional:      General: He is not in acute distress.    Appearance: He is well-developed. He is not ill-appearing or toxic-appearing.  HENT:     Head: Normocephalic and atraumatic.     Right Ear: Tympanic membrane, ear canal and external ear normal. Tympanic membrane is not scarred, perforated or erythematous.     Left Ear: Tympanic membrane, ear canal and external ear normal. Tympanic membrane is not scarred, perforated or erythematous.  Cardiovascular:     Rate and Rhythm: Normal rate and regular rhythm.     Heart sounds: Normal heart sounds.  Pulmonary:     Effort: Pulmonary effort is normal. No accessory muscle usage or respiratory distress.     Breath sounds: Normal breath sounds.  Skin:    General: Skin is warm and dry.  Neurological:     Mental Status: He is alert.  Psychiatric:        Speech: Speech normal.        Behavior: Behavior is cooperative.      Assessment and Plan:   [name] was seen today for tinnitus.  Diagnoses and all orders for this visit:  Tinnitus of both ears No red flags on exam. No evidence of cerumen impaction or infection. Referral to ENT. Reviewed list of medications that may cause tinnitus, which does include ACE-inhibitors, such as lisinopril, which he is currently on. Will defer changing this at this time as there are several other blood pressure medications that could also cause/exacerbate tinnitus and unclear if this change would be significant. Worsening precautions advised. -     Ambulatory referral to ENT  Insomnia due to medical condition Will  trial trazodone 50 mg tablet. May trial 25 mg tablet during the first night, but may increase to full tablet next night if indicated. If ineffective, I asked patient to let us know.  Other orders -     traZODone (DESYREL) 50 MG tablet; Take 0.5-1 tablets (  25-50 mg total) by mouth at bedtime as needed for sleep.  . Reviewed expectations re: course of current medical issues. . Discussed self-management of symptoms. . Outlined signs and symptoms indicating need for more acute intervention. . Patient verbalized understanding and all questions were answered. . See orders for this visit as documented in the electronic medical record. . Patient received an After-Visit Summary.  CMA or LPN served as scribe during this visit. History, Physical, and Plan performed by medical provider. The above documentation has been reviewed and is accurate and complete.   Inda Coke, PA-C

## 2019-10-08 NOTE — Telephone Encounter (Signed)
Pt is scheduled to see Sam at 89 today.

## 2019-10-10 ENCOUNTER — Encounter: Payer: Self-pay | Admitting: Physician Assistant

## 2019-10-10 ENCOUNTER — Encounter: Payer: Self-pay | Admitting: Family Medicine

## 2019-10-11 ENCOUNTER — Other Ambulatory Visit: Payer: Self-pay | Admitting: Physician Assistant

## 2019-10-11 MED ORDER — HYDROCHLOROTHIAZIDE 12.5 MG PO CAPS: 12.5000 mg | ORAL_CAPSULE | Freq: Every day | ORAL | 0 refills | Status: DC

## 2019-10-14 ENCOUNTER — Encounter: Payer: Self-pay | Admitting: Physician Assistant

## 2019-10-14 ENCOUNTER — Ambulatory Visit: Payer: 59 | Admitting: Family Medicine

## 2019-10-14 ENCOUNTER — Encounter: Payer: Self-pay | Admitting: Family Medicine

## 2019-10-24 ENCOUNTER — Encounter (INDEPENDENT_AMBULATORY_CARE_PROVIDER_SITE_OTHER): Payer: Self-pay | Admitting: Otolaryngology

## 2019-10-24 ENCOUNTER — Other Ambulatory Visit: Payer: Self-pay

## 2019-10-24 ENCOUNTER — Ambulatory Visit (INDEPENDENT_AMBULATORY_CARE_PROVIDER_SITE_OTHER): Payer: 59 | Admitting: Otolaryngology

## 2019-10-24 VITALS — Temp 97.7°F

## 2019-10-24 DIAGNOSIS — H9313 Tinnitus, bilateral: Secondary | ICD-10-CM
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
## 2019-10-28 ENCOUNTER — Encounter (INDEPENDENT_AMBULATORY_CARE_PROVIDER_SITE_OTHER): Payer: Self-pay

## 2019-11-09 ENCOUNTER — Other Ambulatory Visit: Payer: Self-pay | Admitting: Physician Assistant

## 2019-11-25 ENCOUNTER — Encounter: Payer: Self-pay | Admitting: Physician Assistant

## 2019-11-25 DIAGNOSIS — I10 Essential (primary) hypertension: Secondary | ICD-10-CM

## 2019-11-25 MED ORDER — HYDROCHLOROTHIAZIDE 12.5 MG PO CAPS: ORAL_CAPSULE | ORAL | 3 refills | Status: DC

## 2019-12-13 ENCOUNTER — Encounter: Payer: Self-pay | Admitting: Physician Assistant

## 2019-12-13 DIAGNOSIS — I10 Essential (primary) hypertension: Secondary | ICD-10-CM

## 2019-12-13 MED ORDER — HYDROCHLOROTHIAZIDE 12.5 MG PO CAPS: ORAL_CAPSULE | ORAL | 1 refills | Status: DC

## 2020-01-07 MED ORDER — ARMODAFINIL 150 MG PO TABS: 150.0000 mg | ORAL_TABLET | Freq: Every day | ORAL | 3 refills | Status: DC

## 2020-01-07 NOTE — Addendum Note (Signed)
Addended by: Rigoberto Noel on: 01/07/2020 02:11 PM   Modules accepted: Orders

## 2020-01-07 NOTE — Telephone Encounter (Signed)
Called [facility] to check on the status of the refill. They did receive the RX back in September 2020 but because it is a controlled substance, they can only honor it for 6 months. They will need a new RX.   Patient isn't scheduled to follow up until September 2021.   RA, please advise if you are ok with refilling his Nuvigil. Thanks!

## 2020-01-07 NOTE — Telephone Encounter (Signed)
Refilled for 6 months  

## 2020-01-17 IMAGING — US US EXTREM LOW VENOUS*R*
1 series · 13 of 24 positions shown · non-contrast
Comparison: None.

CLINICAL DATA: Right calf pain and swelling



[Series 1: us extrem low venous*right* · 0.10mm/px · 13 of 36 slices shown]
[im 1/36]
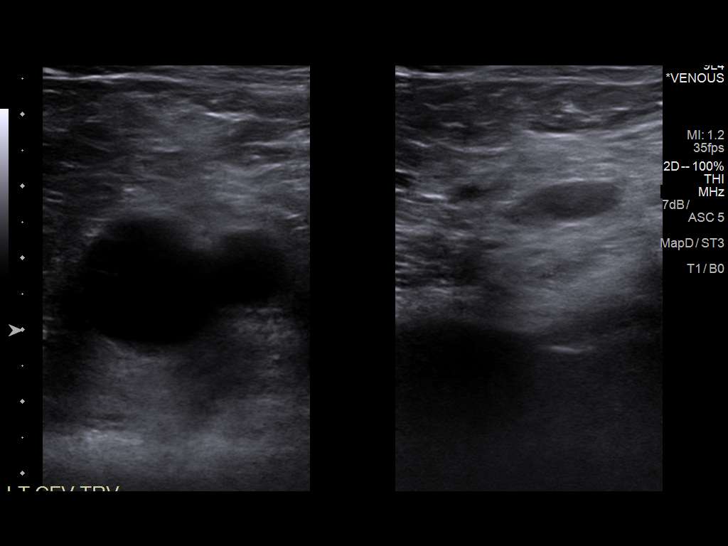
[im 4/36]
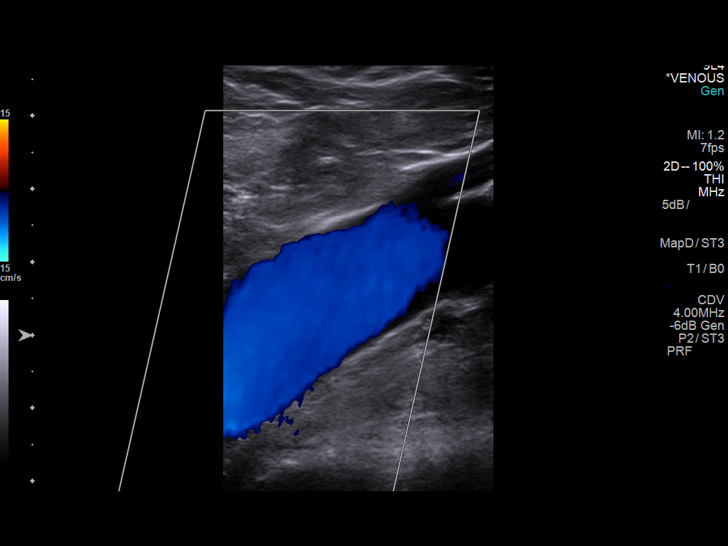
[im 7/36]
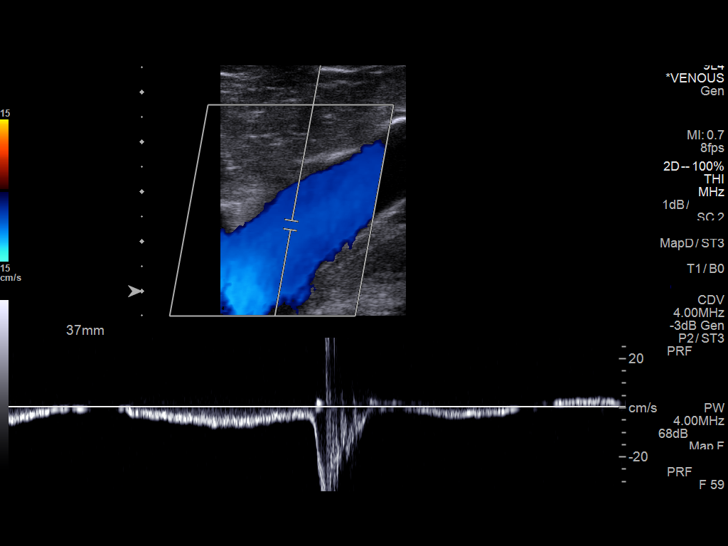
[im 10/36]
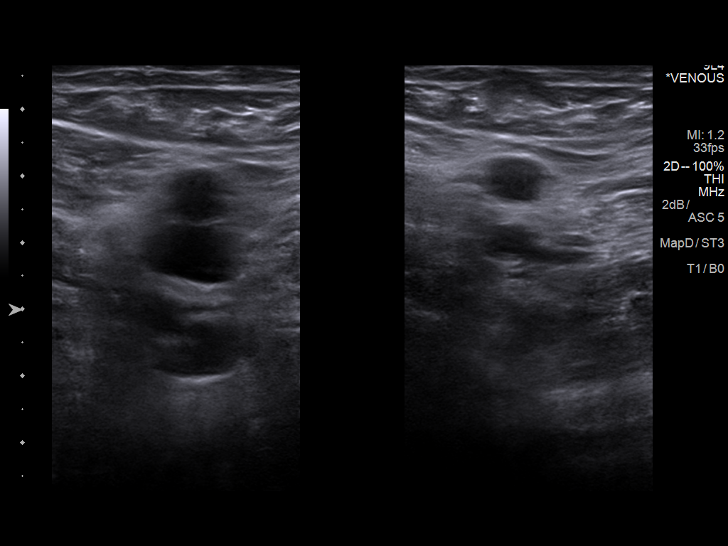
[im 13/36]
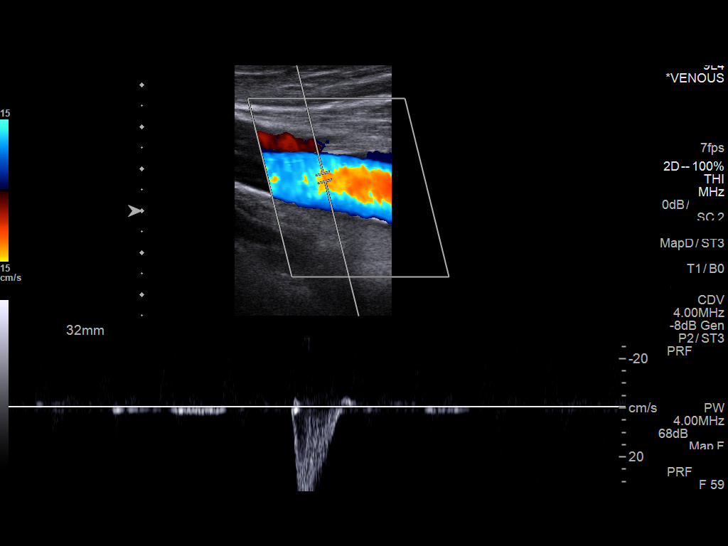
[im 16/36]
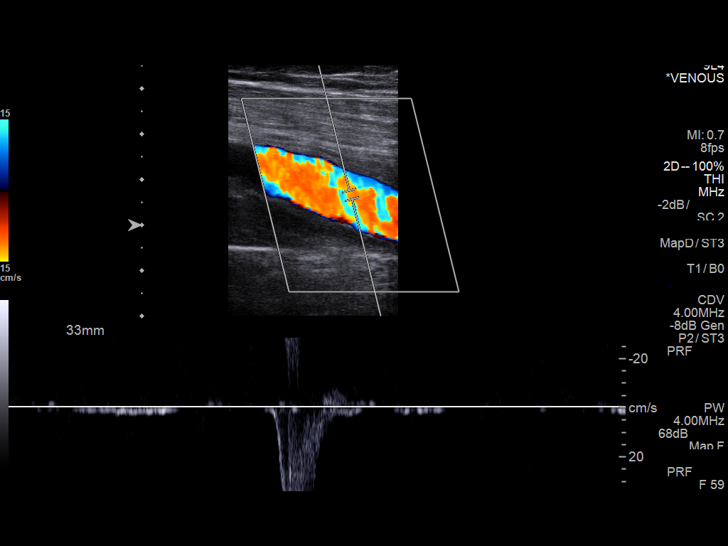
[im 19/36]
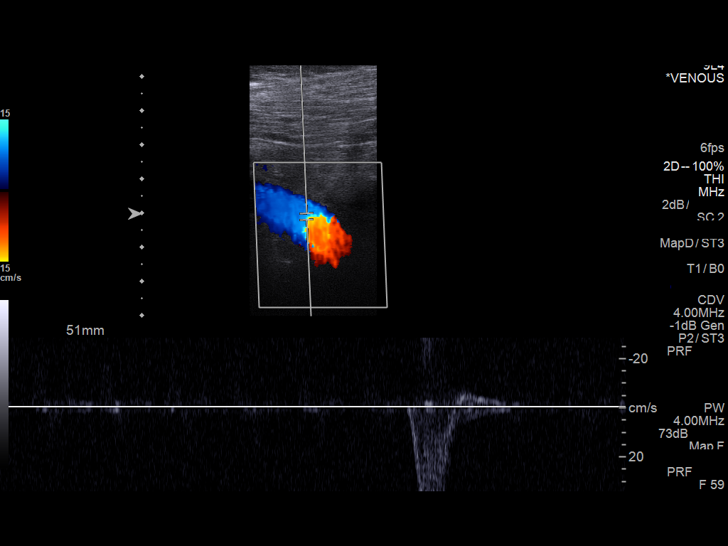
[im 20/36]
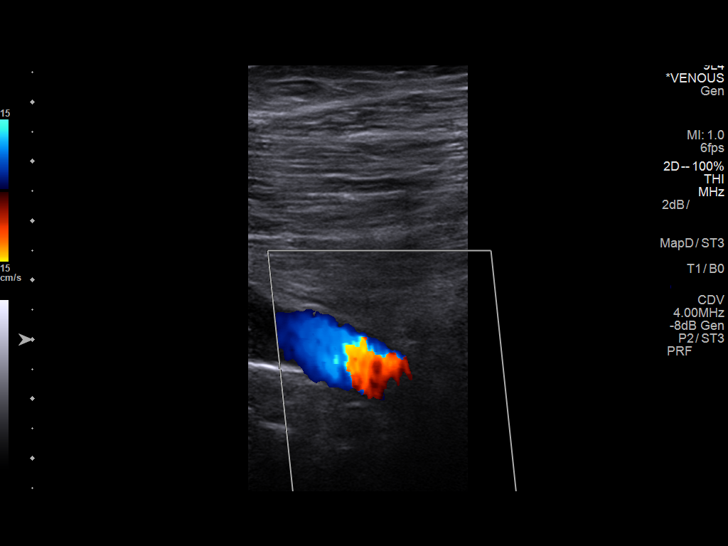
[im 23/36]
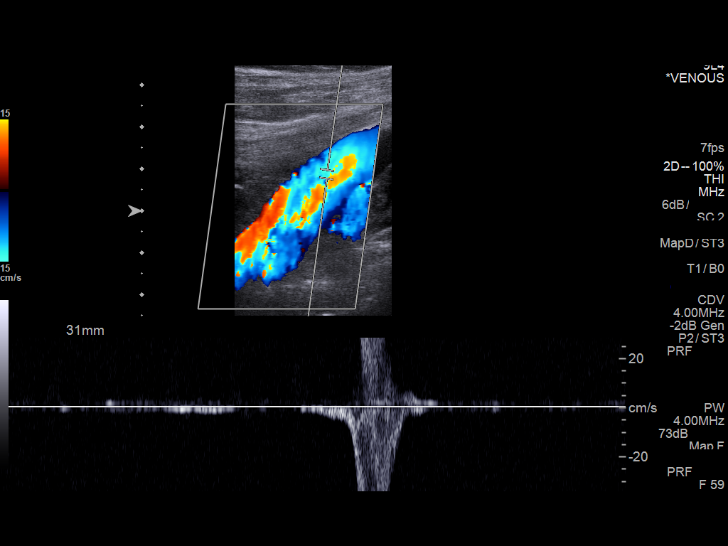
[im 26/36]
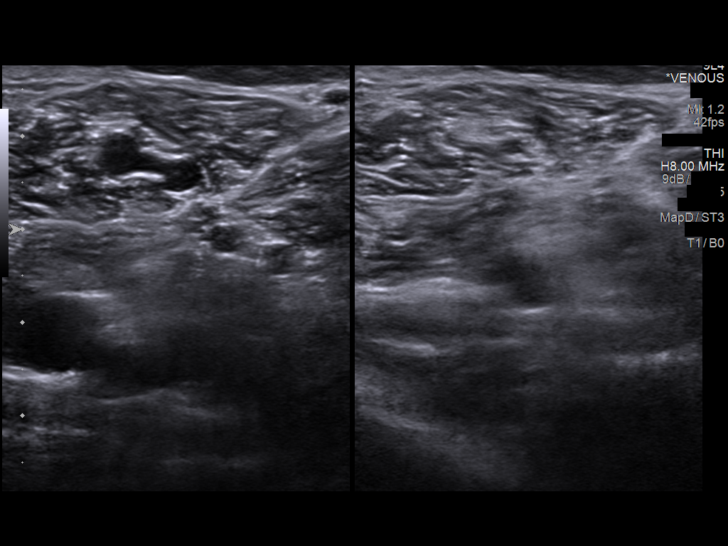
[im 29/36]
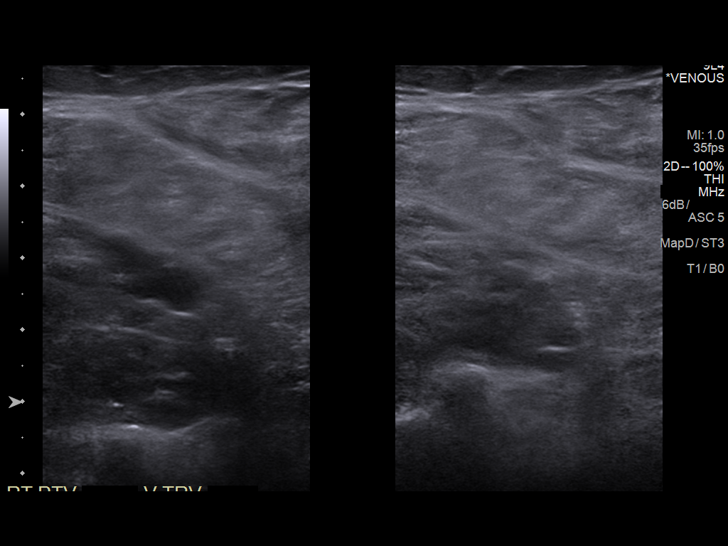
[im 32/36]
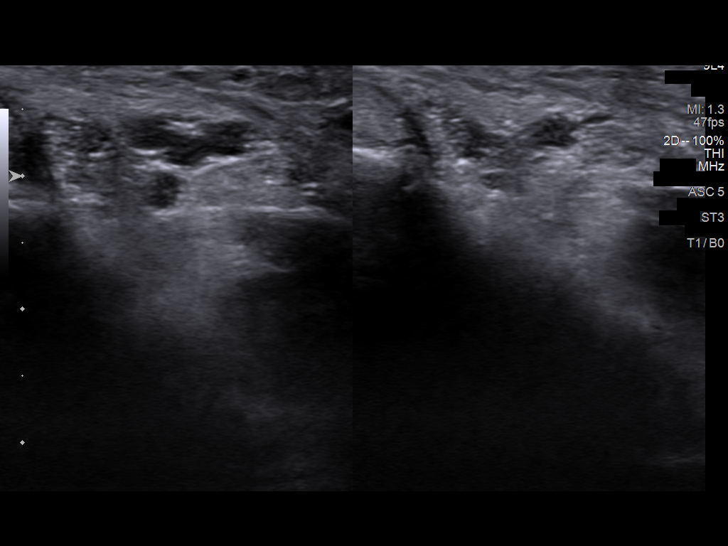
[im 36/36]
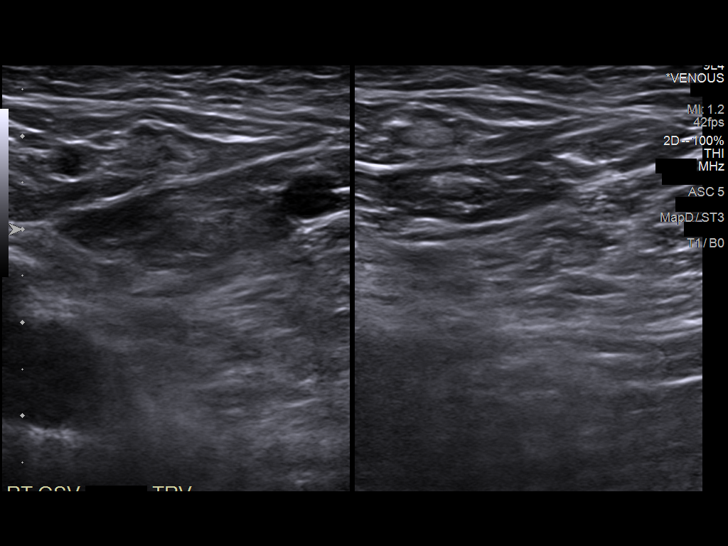

[13 of 24 positions shown; findings below may reference images not displayed]

FINDINGS: Contralateral Common Femoral Vein: Respiratory phasicity is normal
and symmetric with the symptomatic side. No evidence of thrombus.
Normal compressibility.

Common Femoral Vein: No evidence of thrombus. Normal
compressibility, respiratory phasicity and response to augmentation.

Saphenofemoral Junction: No evidence of thrombus. Normal
compressibility and flow on color Doppler imaging.

Profunda Femoral Vein: No evidence of thrombus. Normal
compressibility and flow on color Doppler imaging.

Femoral Vein: No evidence of thrombus. Normal compressibility,
respiratory phasicity and response to augmentation.

Popliteal Vein: No evidence of thrombus. Normal compressibility,
respiratory phasicity and response to augmentation.

Calf Veins: No evidence of thrombus. Normal compressibility and flow
on color Doppler imaging.

Superficial Great Saphenous Vein: No evidence of thrombus. Normal
compressibility.

Venous Reflux:  None.

Other Findings:  None.
IMPRESSION: No evidence of deep venous thrombosis.

## 2020-04-26 ENCOUNTER — Other Ambulatory Visit: Payer: Self-pay | Admitting: Physician Assistant

## 2020-04-26 DIAGNOSIS — I10 Essential (primary) hypertension: Secondary | ICD-10-CM

## 2020-07-14 ENCOUNTER — Other Ambulatory Visit: Payer: Self-pay | Admitting: Pulmonary Disease

## 2020-07-20 ENCOUNTER — Encounter: Payer: Self-pay | Admitting: Family Medicine

## 2020-07-21 ENCOUNTER — Other Ambulatory Visit: Payer: Self-pay

## 2020-07-21 DIAGNOSIS — H269 Unspecified cataract: Secondary | ICD-10-CM

## 2020-09-19 ENCOUNTER — Other Ambulatory Visit: Payer: Self-pay | Admitting: Family Medicine

## 2020-09-30 NOTE — Progress Notes (Signed)
Phone: [phone]   Subjective:  Patient presents today for their annual physical. Chief complaint-noted.   See problem oriented charting- ROS- full  review of systems was completed and negative  except for: tinnitus, urinary frequency, joint pain, a few skin lesions  The following were reviewed and entered/updated in epic: Past Medical History:  Diagnosis Date  . Arthritis    Left Big Toe   . Depression   . Hiccups    in the past/  Took Haloperidol to help  . Hyperlipidemia   . Hypertension   . Pneumonia    as a baby  . Sleep apnea    Patient Active Problem List   Diagnosis Date Noted  . Sleep apnea with hypersomnolence 04/24/2019    Priority: High  . Essential hypertension 08/31/2015    Priority: Medium  . Hyperlipidemia 05/11/2007    Priority: Medium  . OSA (obstructive sleep apnea) 05/11/2007    Priority: Medium  . Right calf pain 06/07/2018    Priority: Low  . Obese 04/15/2015    Priority: Low  . Right hip pain 01/19/2015    Priority: Low  . History of basal cell cancer 05/10/2010    Priority: Low   Past Surgical History:  Procedure Laterality Date  . COLONOSCOPY    . none     no past surgeries    Family History  Problem Relation Age of Onset  . Alcohol abuse Sister   . Depression Mother   . Alcohol abuse Mother   . COPD Mother   . Gallbladder disease Father   . Heart disease Father        CHF- died from this  . Polycythemia Father   . Colon cancer Neg Hx   . Rectal cancer Neg Hx     Medications- reviewed and updated Current Outpatient Medications  Medication Sig Dispense Refill  . Armodafinil 150 MG tablet TAKE 1 TABLET BY MOUTH  DAILY 90 tablet 1  . hydrochlorothiazide (MICROZIDE) 12.5 MG capsule TAKE 1 CAPSULE BY MOUTH  DAILY 90 capsule 3  . Multiple Vitamin (MULTIVITAMIN) tablet Take 1 tablet by mouth daily. Nature Made for men-Take one daily    . Multiple Vitamins-Minerals (AIRBORNE PO) Take by mouth daily.    Marland Kitchen OVER THE COUNTER  MEDICATION TBC Cream    . rosuvastatin (CRESTOR) 20 MG tablet TAKE 1 TABLET BY MOUTH  DAILY 90 tablet 3  . Turmeric (QC TUMERIC COMPLEX PO) Take by mouth.     No current facility-administered medications for this visit.    Allergies-reviewed and updated No Known Allergies  Social History   Social History Narrative   Married 2001. 2 stepchildren. Both grown and married  In mid to late 20s. 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 Oropharynx normal Neck: no thyromegaly CV: RRR no murmurs rubs or gallops Lungs: CTAB no crackles, wheeze, rhonchi Abdomen: soft/nontender/nondistended/normal bowel sounds. No rebound or guarding.  Ext: no edema Skin: warm, dry Neuro: grossly normal, moves all extremities, PERRLA Rectal: normal tone, diffusely enlarged prostate, no masses  or tenderness    Assessment and Plan  65 y.o. male presenting for annual physical.  Health Maintenance counseling: 1. Anticipatory guidance: Patient counseled regarding regular dental exams -q6 months, eye exams -yearly switching to [facility] and better fit for him,  avoiding smoking and second hand smoke , limiting alcohol to 2 beverages per day - 1 a month.  No illicit drugs  2. Risk factor reduction:  Advised patient of need for regular exercise and diet rich and fruits and vegetables to reduce risk of heart attack and stroke. Exercise- 40 miles a week walking his 2 dogs. Diet- last year he tried to improve on his diet- had cut out breakfast burrito that had a lot of cholesterol in it- wants to see if #s are better.feels could improve on diet- increase veggies/fruits- would rate himself  as a B  Wt Readings from Last 3 Encounters:  10/01/20 211 lb (95.7 kg)  10/08/19 211 lb 4 oz (95.8 kg)  10/01/19 209 lb 6.4 oz (95 kg)  3. Immunizations/screenings/ancillary studies-he is going to call us with dates of covid-19 vaccination-otherwise up-to-date  Immunization History  Administered Date(s) Administered  . Influenza Split 04/26/2012, 06/07/2017  . Influenza Whole 04/09/2010  . Influenza,inj,Quad PF,6+ Mos 06/24/2014, 05/20/2019  . Influenza-Unspecified 05/16/2015, 05/30/2018, 06/15/2020  . Td 08/30/2005  . Tdap 12/14/2015  . Zoster Recombinat (Shingrix) 08/27/2018, 01/23/2019  4. Prostate cancer screening- low risk PSA trend-continue to monitor with labs today. Some increased urinary frequency from last year- will get UA and rectal -. This looks like BPH as cause would be my suspicion Lab Results  Component Value Date   PSA 1.74 10/01/2019   PSA 2.17 10/01/2018   PSA 2.06 03/06/2017   5. Colon cancer screening - colonoscopy April 29, 2019 with 10-year repeat planned 6. Skin cancer screening-I recommended regular follow-up with dermatology due to history of basal cell skin cancer in 2014-he reports a few spots he would like looked at on his back and ok with derm refer (done today) advised regular sunscreen use. 7.  Never smoker 8. STD screening - only active with wife so not needed  Status of chronic or acute concerns  #hypertension S: medication: hctz 12.5Mg  -Was on combination pill with lisinopril 10 mg in the past- tried off due to tinnitus but did not help A/P: Stable. Continue current medications.   #hyperlipidemia-with peak LDL of 134 on file S: Medication: Rosuvastatin 20Mg  Daily Lab Results  Component Value Date   CHOL 202 (H) 10/01/2019   HDL 58.00 10/01/2019   LDLCALC 129 (H) 10/01/2019   LDLDIRECT 127.1 04/10/2012   TRIG 75.0 10/01/2019   CHOLHDL 3 10/01/2019   A/P: hopefully improved- update lipids today  #Sleep apnea with  hypersomnolence-patient on CPAP and on armodafinil.  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  [facility] in [facility] past. Working with [facility]   #Cerumen impactions intermittently-has seen Dr. Lucia Gaskins in the past.  Tinnitus on and off in the past and does better with irrigation or Debrox -today reports learning to live with this   # a few days after shoveling snow within last month noted some groin pain - no hernia on exam obviously- may have pulled muscle- reports getting better- 2ill monitor for now  #bone spur on foot from arthritis - using hemp based product/cream- works well for him- saw [facility]  Recommended follow up: Return in about 1 year (around 10/01/2021) for physical or sooner if needed.  Lab/Order associations: fasting   ICD-10-CM   1. Preventative health care  Z00.00 CBC with Differential/Platelet    Comprehensive metabolic panel    Lipid panel    PSA    POCT Urinalysis Dipstick (Automated)    Ambulatory referral to Dermatology  2. Essential hypertension  I10 CBC with Differential/Platelet    Comprehensive metabolic panel    Lipid panel    POCT Urinalysis Dipstick (Automated)  3. Hyperlipidemia, unspecified hyperlipidemia type  E78.5 CBC with Differential/Platelet    Comprehensive metabolic panel    Lipid panel    POCT Urinalysis Dipstick (Automated)  4. Screening for prostate cancer  Z12.5 PSA  5. Skin cancer screening  Z12.83 Ambulatory referral to Dermatology    No orders of the defined types were placed in this encounter.   Return precautions advised.  Garret Reddish, MD

## 2020-09-30 NOTE — Patient Instructions (Addendum)
Please stop by lab before you go If you have mychart- we will send your results within 3 business days of [place] receiving them.  If you do not have mychart- we will call you about results within 5 business days of [place] receiving them.  *please also note that you will see labs on mychart as soon as they post. I will later go in and write notes on them- will say "notes from Dr. Yong Channel"  We will call you within two weeks about your referral to dermatology (team I entered as urgent so please let our referral coordinator know so we can process this quickly) . If you do not hear within 2 weeks, give [place] a call. From referral "[facility]- on right mid lateral back has lesion concerning for possible melanoma that id like looked at rather quickly if possible. Asked him to call to get on cancellation list as well"  Health Maintenance Due  Topic Date Due  . COVID-19 Vaccine (1) Will call back with dates.  Never done   Recommended follow up: Return in about 1 year (around 10/01/2021) for physical or sooner if needed.

## 2020-10-01 ENCOUNTER — Encounter: Payer: Self-pay | Admitting: Family Medicine

## 2020-10-01 ENCOUNTER — Ambulatory Visit (INDEPENDENT_AMBULATORY_CARE_PROVIDER_SITE_OTHER): Payer: 59 | Admitting: Family Medicine

## 2020-10-01 ENCOUNTER — Other Ambulatory Visit: Payer: Self-pay

## 2020-10-01 VITALS — BP 110/76 | HR 75 | Temp 98.3°F | Ht 70.0 in | Wt 211.0 lb

## 2020-10-01 DIAGNOSIS — Z0000 Encounter for general adult medical examination without abnormal findings: Secondary | ICD-10-CM

## 2020-10-01 DIAGNOSIS — I10 Essential (primary) hypertension: Secondary | ICD-10-CM

## 2020-10-01 DIAGNOSIS — Z125 Encounter for screening for malignant neoplasm of prostate: Secondary | ICD-10-CM | POA: Diagnosis not present

## 2020-10-01 DIAGNOSIS — E785 Hyperlipidemia, unspecified: Secondary | ICD-10-CM | POA: Diagnosis not present

## 2020-10-01 DIAGNOSIS — Z1283 Encounter for screening for malignant neoplasm of skin: Secondary | ICD-10-CM

## 2020-10-01 LAB — COMPREHENSIVE METABOLIC PANEL
ALT: 30 U/L (ref 0–53)
AST: 22 U/L (ref 0–37)
Albumin: 4.5 g/dL (ref 3.5–5.2)
Alkaline Phosphatase: 66 U/L (ref 39–117)
BUN: 21 mg/dL (ref 6–23)
CO2: 32 mEq/L (ref 19–32)
Calcium: 9.9 mg/dL (ref 8.4–10.5)
Chloride: 103 mEq/L (ref 96–112)
Creatinine, Ser: 0.95 mg/dL (ref 0.40–1.50)
GFR: 85.77 mL/min (ref >60.00)
Glucose, Bld: 91 mg/dL (ref 70–99)
Potassium: 4.4 mEq/L (ref 3.5–5.1)
Sodium: 141 mEq/L (ref 135–145)
Total Bilirubin: 0.7 mg/dL (ref 0.2–1.2)
Total Protein: 6.6 g/dL (ref 6.0–8.3)

## 2020-10-01 LAB — LIPID PANEL
Cholesterol: 158 mg/dL (ref 0–200)
HDL: 55.1 mg/dL (ref >39.00)
LDL Cholesterol: 81 mg/dL (ref 0–99)
NonHDL: 102.53
Total CHOL/HDL Ratio: 3
Triglycerides: 107 mg/dL (ref 0.0–149.0)
VLDL: 21.4 mg/dL (ref 0.0–40.0)

## 2020-10-01 LAB — POC URINALSYSI DIPSTICK (AUTOMATED)
Bilirubin, UA: NEGATIVE
Blood, UA: NEGATIVE
Glucose, UA: NEGATIVE
Ketones, UA: NEGATIVE
Leukocytes, UA: NEGATIVE
Nitrite, UA: NEGATIVE
Protein, UA: POSITIVE — AB
Spec Grav, UA: 1.015 (ref 1.010–1.025)
Urobilinogen, UA: 1 E.U./dL
pH, UA: 8 (ref 5.0–8.0)

## 2020-10-01 LAB — CBC WITH DIFFERENTIAL/PLATELET
Basophils Absolute: 0.1 10*3/uL (ref 0.0–0.1)
Basophils Relative: 0.6 % (ref 0.0–3.0)
Eosinophils Absolute: 0.2 10*3/uL (ref 0.0–0.7)
Eosinophils Relative: 2.6 % (ref 0.0–5.0)
HCT: 49.1 % (ref 39.0–52.0)
Hemoglobin: 17 g/dL (ref 13.0–17.0)
Lymphocytes Relative: 29.6 % (ref 12.0–46.0)
Lymphs Abs: 2.5 10*3/uL (ref 0.7–4.0)
MCHC: 34.7 g/dL (ref 30.0–36.0)
MCV: 92.1 fl (ref 78.0–100.0)
Monocytes Absolute: 0.6 10*3/uL (ref 0.1–1.0)
Monocytes Relative: 6.8 % (ref 3.0–12.0)
Neutro Abs: 5.1 10*3/uL (ref 1.4–7.7)
Neutrophils Relative %: 60.4 % (ref 43.0–77.0)
Platelets: 263 10*3/uL (ref 150.0–400.0)
RBC: 5.33 Mil/uL (ref 4.22–5.81)
RDW: 13.4 % (ref 11.5–15.5)
WBC: 8.4 10*3/uL (ref 4.0–10.5)

## 2020-10-01 LAB — PSA: PSA: 2.33 ng/mL (ref 0.10–4.00)

## 2020-10-01 MED ORDER — HYDROCHLOROTHIAZIDE 12.5 MG PO CAPS
ORAL_CAPSULE | ORAL | 3 refills | Status: DC
Start: 2020-10-01 — End: 2021-10-06

## 2020-10-01 MED ORDER — ROSUVASTATIN CALCIUM 20 MG PO TABS: 20.0000 mg | ORAL_TABLET | Freq: Every day | ORAL | 3 refills | Status: DC

## 2020-10-01 NOTE — Addendum Note (Signed)
Addended by: Brandy Hale on: 10/01/2020 08:55 AM   Modules accepted: Orders

## 2020-10-03 ENCOUNTER — Encounter: Payer: Self-pay | Admitting: Family Medicine

## 2020-12-15 ENCOUNTER — Encounter: Payer: Self-pay | Admitting: Family Medicine

## 2020-12-15 DIAGNOSIS — Z85820 Personal history of malignant melanoma of skin: Secondary | ICD-10-CM | POA: Insufficient documentation

## 2021-01-08 ENCOUNTER — Telehealth: Payer: Self-pay | Admitting: Pulmonary Disease

## 2021-01-08 NOTE — Telephone Encounter (Signed)
Called and spoke with Patient.  Patient requested a refill for Nuvigil.  LOV was 04/24/19.  Advised Patient he needed a OV, before medication could be sent to pharmacy.  Patient scheduled with Beth, NP 01/14/21 at 0930.  Nothing further at this time.

## 2021-01-14 ENCOUNTER — Other Ambulatory Visit: Payer: Self-pay

## 2021-01-14 ENCOUNTER — Ambulatory Visit (INDEPENDENT_AMBULATORY_CARE_PROVIDER_SITE_OTHER): Payer: 59 | Admitting: Primary Care

## 2021-01-14 ENCOUNTER — Encounter: Payer: Self-pay | Admitting: Primary Care

## 2021-01-14 DIAGNOSIS — G4733 Obstructive sleep apnea (adult) (pediatric): Secondary | ICD-10-CM | POA: Diagnosis not present

## 2021-01-14 MED ORDER — ARMODAFINIL 150 MG PO TABS: 150.0000 mg | ORAL_TABLET | Freq: Every day | ORAL | 2 refills | Status: DC

## 2021-01-14 NOTE — Progress Notes (Signed)
@Patient  ID: Vincent Wilkerson, male    DOB: 02/19/1956, 65 y.o.   MRN: [id number]  Chief Complaint  Patient presents with  . Follow-up    Pt states he has been doing okay since last visit. States he is still wearing his CPAP machine and denies any concerns.    Referring provider: Marin Olp, MD  HPI: 65 year old male, never smoked. PMH significant for OSA, hypertension, obesity. Patient of Dr. Elsworth Soho. PSG  In [place] 08/2001- AHI of 14.9 events per hour. For supine sleep the AHI was 53.4 events per hour. Titration >> CPAP  9 cm. Maintained on CPAP at 9cm H20 and Nuvigil for hypersomnia with sleep apnea.   Previous [facility] pulmonary encounter: 11/09/2018 Patient called today for annual Sleep apnea follow-up. He is doing well, states that he is 100% compliant with CPAP use. Download is unavailable and SD card unable to be accessed d/t E-vist. States that his CPAP does not have wifi. Got current machine from his father in law 3 years ago, he is unsure how old it is. No issue with pressure setting, currently set at 9cm H20. He has gone back to full face mask from nasal pillows. Feels he breaths easier with full face but does struggle with increased humidity with mask during the summer. States that he try's to turn down the humidity setting on his device but knob is broken.Takes Nuvigil M-F. Helps productivity at work. 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 He does not need refill of medication today.   04/24/2019 Patient presents today for 6 month follow-up. He is doing well. Received a new machine and airview shows 100% compliance with CPAP. No issues with mask fit or pressure setting. Continues to struggle with wearing He has had some difficulty with DME company [employer]. Reports less snoring and has stopped falling asleep at work. He is taking Nuvigil daily M-F and not always on the weekends.   Airview download 8/10-04/23/19: Usage days 30/30 days;  100% > 4 hours Average usage 6 hours 32 mins Pressure 9cm H20 Min air leaks AHI 0.2  01/14/2021  - Interim Patient presents today for overdue follow-up, last seen in September 2020. He is requesting Nuvigil medication refill. He is doing well, complaint with CPAP use. No issues with mask or pressure setting. He gets on average 6-7 hours of sleep a night. Bedtime is between 9-9:30pm and he gets out of bed around 3:30-4am every day. He takes Nuvigil at 7am in the morning and this works well for him. He does not report excessive daytime fatigue or somnolence. Epworth score 3/24. Struggling with his weight. States that he needs more of a regular routine. He has some arthritis which prevents him for being as active as he would like.    Airview download 12/15/20-01/13/21 29/30 days (97%); 29 days (97%) > 4 hours Average usage 6 hours 41 mins Pressure 9cm h20 Leaks 7.6L/min AHI 0.1   No Known Allergies  Immunization History  Administered Date(s) Administered  . Influenza Split 04/26/2012, 06/07/2017  . Influenza Whole 04/09/2010  . Influenza,inj,Quad PF,6+ Mos 06/24/2014, 05/20/2019  . Influenza-Unspecified 05/16/2015, 05/30/2018, 06/15/2020  . Td 08/30/2005  . Tdap 12/14/2015  . Zoster Recombinat (Shingrix) 08/27/2018, 01/23/2019    Past Medical History:  Diagnosis Date  . Arthritis    Left Big Toe   . Depression   . Hiccups    in the past/  Took Haloperidol  to help  . Hyperlipidemia   . Hypertension   . Pneumonia    as a baby  . Sleep apnea     Tobacco History: Social History   Tobacco Use  Smoking Status Never Smoker  Smokeless Tobacco Never Used   Counseling given: Not Answered   Outpatient Medications Prior to Visit  Medication Sig Dispense Refill  . hydrochlorothiazide (MICROZIDE) 12.5 MG capsule TAKE 1 CAPSULE BY MOUTH  DAILY 90 capsule 3  . Multiple Vitamin (MULTIVITAMIN) tablet Take 1 tablet by mouth daily. Nature Made for men-Take one daily    . Multiple  Vitamins-Minerals (AIRBORNE PO) Take by mouth daily.    Marland Kitchen OVER THE COUNTER MEDICATION TBC Cream    . rosuvastatin (CRESTOR) 20 MG tablet Take 1 tablet (20 mg total) by mouth daily. 90 tablet 3  . Armodafinil 150 MG tablet TAKE 1 TABLET BY MOUTH  DAILY 90 tablet 1  . Turmeric (QC TUMERIC COMPLEX PO) Take by mouth.     No facility-administered medications prior to visit.    Review of Systems  Review of Systems  Constitutional: Negative.   Respiratory: Negative.   Cardiovascular: Negative.   Psychiatric/Behavioral: Negative.    Physical Exam  BP 124/80 (BP Location: Right Arm, Patient Position: Sitting, Cuff Size: Normal)   Pulse 91   Ht 5\' 10"  (1.778 m)   Wt 215 lb 3.2 oz (97.6 kg)   SpO2 96% Comment: RA  BMI 30.88 kg/m  Physical Exam Constitutional:      Appearance: Normal appearance.  HENT:     Head: Normocephalic and atraumatic.     Mouth/Throat:     Mouth: Mucous membranes are moist.     Pharynx: Oropharynx is clear.  Cardiovascular:     Rate and Rhythm: Normal rate and regular rhythm.  Pulmonary:     Effort: Pulmonary effort is normal.     Breath sounds: Normal breath sounds.  Skin:    General: Skin is warm and dry.  Neurological:     General: No focal deficit present.     Mental Status: He is alert and oriented to person, place, and time. Mental status is at baseline.  Psychiatric:        Mood and Affect: Mood normal.        Behavior: Behavior normal.        Thought Content: Thought content normal.        Judgment: Judgment normal.      Lab Results:  CBC    Component Value Date/Time   WBC 8.4 10/01/2020 0847   RBC 5.33 10/01/2020 0847   HGB 17.0 10/01/2020 0847   HCT 49.1 10/01/2020 0847   PLT 263.0 10/01/2020 0847   MCV 92.1 10/01/2020 0847   MCHC 34.7 10/01/2020 0847   RDW 13.4 10/01/2020 0847   LYMPHSABS 2.5 10/01/2020 0847   MONOABS 0.6 10/01/2020 0847   EOSABS 0.2 10/01/2020 0847   BASOSABS 0.1 10/01/2020 0847    BMET    Component  Value Date/Time   NA 141 10/01/2020 0847   K 4.4 10/01/2020 0847   CL 103 10/01/2020 0847   CO2 32 10/01/2020 0847   GLUCOSE 91 10/01/2020 0847   BUN 21 10/01/2020 0847   CREATININE 0.95 10/01/2020 0847   CALCIUM 9.9 10/01/2020 0847   GFRNONAA 84.37 03/25/2010 0844   GFRAA 92 12/06/2007 0945    BNP No results found for: BNP  ProBNP No results found for: PROBNP  Imaging: No results found.   Assessment &  Plan:   OSA (obstructive sleep apnea) Patient is 97% compliant with CPAP and reports benefit from use. He gets 6-7 hours of sleep a night. Cpap pressure 9cm h20; residual AHI 0.1. He takes Nuvigil at 7am every morning. Epworth score 3/24. Advised patient to continue to wear CPAP every night 4-6 hours or longer. Do not drive if experiencing excessive daytime fatigue. Encourage weight loss efforts. PMP reviewed, no unexpected prescriptions found. FU in 1 year with Dr. Elsworth Soho or sooner if needed.   Martyn Ehrich, NP 01/14/2021

## 2021-01-14 NOTE — Assessment & Plan Note (Addendum)
Patient is 97% compliant with CPAP and reports benefit from use. He gets 6-7 hours of sleep a night. Cpap pressure 9cm h20; residual AHI 0.1. He takes Nuvigil at 7am every morning. Epworth score 3/24. Advised patient to continue to wear CPAP every night 4-6 hours or longer. Do not drive if experiencing excessive daytime fatigue. Encourage weight loss efforts. PMP reviewed, no unexpected prescriptions found. FU in 1 year with Dr. Elsworth Soho or sooner if needed.

## 2021-01-14 NOTE — Patient Instructions (Addendum)
Nice seeing you today Vincent Wilkerson, glad you are doing well  Recommendations: - Continue to wear CPAP every night 4-6 hours or longer - Work on weight loss efforts - Do not drive if experiencing excessive daytime fatigue  Rx: -Nuvigil (90 day supply with 2 refills) [facility]   Follow-up: - 1 year with Dr. Elsworth Soho or sooner if needed     Living With Sleep Apnea Sleep apnea is a condition in which breathing pauses or becomes shallow during sleep. Sleep apnea is most commonly caused by a collapsed or blocked airway. People with sleep apnea snore loudly and have times when they gasp and stop breathing for 10 seconds or more during sleep. This happens over and over during the night. This disrupts your sleep and keeps your body from getting the rest that it needs, which can cause tiredness and lack of energy (fatigue) during the day. The breaks in breathing also interrupt the deep sleep that you need to feel rested. Even if you do not completely wake up from the gaps in breathing, your sleep may not be restful. You may also have a headache in the morning and low energy during the day, and you may feel anxious or depressed. How can sleep apnea affect me? Sleep apnea increases your chances of extreme tiredness during the day (daytime fatigue). It can also increase your risk for health conditions, such as:  Heart attack.  Stroke.  Diabetes.  Heart failure.  Irregular heartbeat.  High blood pressure. If you have daytime fatigue as a result of sleep apnea, you may be more likely to:  Perform poorly at school or work.  Fall asleep while driving.  Have difficulty with attention.  Develop depression or anxiety.  Become severely overweight (obese).  Have sexual dysfunction. What actions can I take to manage sleep apnea? Sleep apnea treatment  If you were given a device to open your airway while you sleep, use it only as told by your health care provider. You may be given: ? An  oral appliance. This is a custom-made mouthpiece that shifts your lower jaw forward. ? A continuous positive airway pressure (CPAP) device. This device blows air through a mask when you breathe out (exhale). ? A nasal expiratory positive airway pressure (EPAP) device. This device has valves that you put into each nostril. ? A bi-level positive airway pressure (BPAP) device. This device blows air through a mask when you breathe in (inhale) and breathe out (exhale).  You may need surgery if other treatments do not work for you.   Sleep habits  Go to sleep and wake up at the same time every day. This helps set your internal clock (circadian rhythm) for sleeping. ? If you stay up later than usual, such as on weekends, try to get up in the morning within 2 hours of your normal wake time.  Try to get at least 7-9 hours of sleep each night.  Stop computer, tablet, and mobile phone use a few hours before bedtime.  Do not take long naps during the day. If you nap, limit it to 30 minutes.  Have a relaxing bedtime routine. Reading or listening to music may relax you and help you sleep.  Use your bedroom only for sleep. ? Keep your television and computer out of your bedroom. ? Keep your bedroom cool, dark, and quiet. ? Use a supportive mattress and pillows.  Follow your health care provider's instructions for other changes to sleep habits. Nutrition  Do not eat heavy meals in the evening.  Do not have caffeine in the later part of the day. The effects of caffeine can last for more than 5 hours.  Follow your health care provider's or dietitian's instructions for any diet changes. Lifestyle  Do not drink alcohol before bedtime. Alcohol can cause you to fall asleep at first, but then it can cause you to wake up in the middle of the night and have trouble getting back to sleep.  Do not use any products that contain nicotine or tobacco, such as cigarettes and e-cigarettes. If you need help  quitting, ask your health care provider.      Medicines  Take over-the-counter and prescription medicines only as told by your health care provider.  Do not use over-the-counter sleep medicine. You can become dependent on this medicine, and it can make sleep apnea worse.  Do not use medicines, such as sedatives and narcotics, unless told by your health care provider. Activity  Exercise on most days, but avoid exercising in the evening. Exercising near bedtime can interfere with sleeping.  If possible, spend time outside every day. Natural light helps regulate your circadian rhythm. General information  Lose weight if you need to, and maintain a healthy weight.  Keep all follow-up visits as told by your health care provider. This is important.  If you are having surgery, make sure to tell your health care provider that you have sleep apnea. You may need to bring your device with you. Where to find more information Learn more about sleep apnea and daytime fatigue from:  American Sleep Association: sleepassociation.[place]: sleepfoundation.org  National Heart, Lung, and Blood Institute: https://www.hartman-hill.biz/ Summary  Sleep apnea can cause daytime fatigue and other serious health conditions.  Both sleep apnea and daytime fatigue can be bad for your health and well-being.  You may need to wear a device while sleeping to help keep your airway open.  If you are having surgery, make sure to tell your health care provider that you have sleep apnea. You may need to bring your device with you.  Making changes to sleep habits, diet, lifestyle, and activity can help you manage sleep apnea. This information is not intended to replace advice given to you by your health care provider. Make sure you discuss any questions you have with your health care provider. Document Revised: 11/23/2018 Document Reviewed: 10/26/2017 Elsevier Patient Education  2021 [employer] Calories are units of energy. Your body needs a certain number of calories from food to keep going throughout the day. When you eat or drink more calories than your body needs, your body stores the extra calories mostly as fat. When you eat or drink fewer calories than your body needs, your body burns fat to get the energy it needs. Calorie counting means keeping track of how many calories you eat and drink each day. Calorie counting can be helpful if you need to lose weight. If you eat fewer calories than your body needs, you should lose weight. Ask your health care provider what a healthy weight is for you. For calorie counting to work, you will need to eat the right number of calories each day to lose a healthy amount of weight per week. A dietitian can help you figure out how many calories you need in a day and will suggest ways to reach your calorie goal.  A healthy amount of weight to lose each  week is usually 1-2 lb (0.5-0.9 kg). This usually means that your daily calorie intake should be reduced by 500-750 calories.  Eating 1,200-1,500 calories a day can help most women lose weight.  Eating 1,500-1,800 calories a day can help most men lose weight. What do I need to know about calorie counting? Work with your health care provider or dietitian to determine how many calories you should get each day. To meet your daily calorie goal, you will need to:  Find out how many calories are in each food that you would like to eat. Try to do this before you eat.  Decide how much of the food you plan to eat.  Keep a food log. Do this by writing down what you ate and how many calories it had. To successfully lose weight, it is important to balance calorie counting with a healthy lifestyle that includes regular activity. Where do I find calorie information? The number of calories in a food can be found on a Nutrition Facts label. If a food does not have a Nutrition  Facts label, try to look up the calories online or ask your dietitian for help. Remember that calories are listed per serving. If you choose to have more than one serving of a food, you will have to multiply the calories per serving by the number of servings you plan to eat. For example, the label on a package of bread might say that a serving size is 1 slice and that there are 90 calories in a serving. If you eat 1 slice, you will have eaten 90 calories. If you eat 2 slices, you will have eaten 180 calories.   How do I keep a food log? After each time that you eat, record the following in your food log as soon as possible:  What you ate. Be sure to include toppings, sauces, and other extras on the food.  How much you ate. This can be measured in cups, ounces, or number of items.  How many calories were in each food and drink.  The total number of calories in the food you ate. Keep your food log near you, such as in a pocket-sized notebook or on an app or website on your mobile phone. Some programs will calculate calories for you and show you how many calories you have left to meet your daily goal. What are some portion-control tips?  Know how many calories are in a serving. This will help you know how many servings you can have of a certain food.  Use a measuring cup to measure serving sizes. You could also try weighing out portions on a kitchen scale. With time, you will be able to estimate serving sizes for some foods.  Take time to put servings of different foods on your favorite plates or in your favorite bowls and cups so you know what a serving looks like.  Try not to eat straight from a food's packaging, such as from a bag or box. Eating straight from the package makes it hard to see how much you are eating and can lead to overeating. Put the amount you would like to eat in a cup or on a plate to make sure you are eating the right portion.  Use smaller plates, glasses, and bowls for  smaller portions and to prevent overeating.  Try not to multitask. For example, avoid watching TV or using your computer while eating. If it is time to eat, sit down at a table  and enjoy your food. This will help you recognize when you are full. It will also help you be more mindful of what and how much you are eating. What are tips for following this plan? Reading food labels  Check the calorie count compared with the serving size. The serving size may be smaller than what you are used to eating.  Check the source of the calories. Try to choose foods that are high in protein, fiber, and vitamins, and low in saturated fat, trans fat, and sodium. Shopping  Read nutrition labels while you shop. This will help you make healthy decisions about which foods to buy.  Pay attention to nutrition labels for low-fat or fat-free foods. These foods sometimes have the same number of calories or more calories than the full-fat versions. They also often have added sugar, starch, or salt to make up for flavor that was removed with the fat.  Make a grocery list of lower-calorie foods and stick to it. Cooking  Try to cook your favorite foods in a healthier way. For example, try baking instead of frying.  Use low-fat dairy products. Meal planning  Use more fruits and vegetables. One-half of your plate should be fruits and vegetables.  Include lean proteins, such as chicken, [place], and fish. Lifestyle Each week, aim to do one of the following:  150 minutes of moderate exercise, such as walking.  75 minutes of vigorous exercise, such as running. General information  Know how many calories are in the foods you eat most often. This will help you calculate calorie counts faster.  Find a way of tracking calories that works for you. Get creative. Try different apps or programs if writing down calories does not work for you. What foods should I eat?  Eat nutritious foods. It is better to have a  nutritious, high-calorie food, such as an avocado, than a food with few nutrients, such as a bag of potato chips.  Use your calories on foods and drinks that will fill you up and will not leave you hungry soon after eating. ? Examples of foods that fill you up are nuts and nut butters, vegetables, lean proteins, and high-fiber foods such as whole grains. High-fiber foods are foods with more than 5 g of fiber per serving.  Pay attention to calories in drinks. Low-calorie drinks include water and unsweetened drinks. The items listed above may not be a complete list of foods and beverages you can eat. Contact a dietitian for more information.   What foods should I limit? Limit foods or drinks that are not good sources of vitamins, minerals, or protein or that are high in unhealthy fats. These include:  Candy.  Other sweets.  Sodas, specialty coffee drinks, alcohol, and juice. The items listed above may not be a complete list of foods and beverages you should avoid. Contact a dietitian for more information. How do I count calories when eating out?  Pay attention to portions. Often, portions are much larger when eating out. Try these tips to keep portions smaller: ? Consider sharing a meal instead of getting your own. ? If you get your own meal, eat only half of it. Before you start eating, ask for a container and put half of your meal into it. ? When available, consider ordering smaller portions from the menu instead of full portions.  Pay attention to your food and drink choices. Knowing the way food is cooked and what is included with the meal can help you  eat fewer calories. ? If calories are listed on the menu, choose the lower-calorie options. ? Choose dishes that include vegetables, fruits, whole grains, low-fat dairy products, and lean proteins. ? Choose items that are boiled, broiled, grilled, or steamed. Avoid items that are buttered, battered, fried, or served with cream sauce. Items  labeled as crispy are usually fried, unless stated otherwise. ? Choose water, low-fat milk, unsweetened iced tea, or other drinks without added sugar. If you want an alcoholic beverage, choose a lower-calorie option, such as a glass of wine or light beer. ? Ask for dressings, sauces, and syrups on the side. These are usually high in calories, so you should limit the amount you eat. ? If you want a salad, choose a garden salad and ask for grilled meats. Avoid extra toppings such as bacon, cheese, or fried items. Ask for the dressing on the side, or ask for olive oil and vinegar or lemon to use as dressing.  Estimate how many servings of a food you are given. Knowing serving sizes will help you be aware of how much food you are eating at restaurants. Where to find more information  Centers for Disease Control and Prevention: http://www.wolf.info/  U.S. Department of Agriculture: http://www.wilson-mendoza.org/ Summary  Calorie counting means keeping track of how many calories you eat and drink each day. If you eat fewer calories than your body needs, you should lose weight.  A healthy amount of weight to lose per week is usually 1-2 lb (0.5-0.9 kg). This usually means reducing your daily calorie intake by 500-750 calories.  The number of calories in a food can be found on a Nutrition Facts label. If a food does not have a Nutrition Facts label, try to look up the calories online or ask your dietitian for help.  Use smaller plates, glasses, and bowls for smaller portions and to prevent overeating.  Use your calories on foods and drinks that will fill you up and not leave you hungry shortly after a meal. This information is not intended to replace advice given to you by your health care provider. Make sure you discuss any questions you have with your health care provider. Document Revised: 09/12/2019 Document Reviewed: 09/12/2019 Elsevier Patient Education  2021 [employer].

## 2021-06-03 ENCOUNTER — Ambulatory Visit: Payer: 59

## 2021-08-10 ENCOUNTER — Other Ambulatory Visit: Payer: Self-pay | Admitting: Pulmonary Disease

## 2021-08-11 MED ORDER — ARMODAFINIL 150 MG PO TABS: 150.0000 mg | ORAL_TABLET | Freq: Every day | ORAL | 2 refills | Status: DC

## 2021-08-11 NOTE — Telephone Encounter (Signed)
Please advise on refill. 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 Please advise if you are ok filling this time.

## 2021-08-11 NOTE — Telephone Encounter (Signed)
Approved based on appointment 01/14/21 with Beth.

## 2021-08-12 ENCOUNTER — Other Ambulatory Visit: Payer: Self-pay | Admitting: Primary Care

## 2021-08-12 NOTE — Telephone Encounter (Signed)
Refill requested to soon

## 2021-08-30 ENCOUNTER — Other Ambulatory Visit: Payer: Self-pay

## 2021-08-30 ENCOUNTER — Encounter: Payer: Self-pay | Admitting: Primary Care

## 2021-08-30 ENCOUNTER — Ambulatory Visit (INDEPENDENT_AMBULATORY_CARE_PROVIDER_SITE_OTHER): Payer: 59 | Admitting: Primary Care

## 2021-08-30 DIAGNOSIS — G4733 Obstructive sleep apnea (adult) (pediatric): Secondary | ICD-10-CM

## 2021-08-30 MED ORDER — ARMODAFINIL 150 MG PO TABS: 150.0000 mg | ORAL_TABLET | Freq: Every day | ORAL | 2 refills | Status: DC

## 2021-08-30 NOTE — Progress Notes (Signed)
@Patient  ID: Vincent Wilkerson, male    DOB: [birth year], 66 y.o.   MRN: [id number]  Chief Complaint  Patient presents with   Follow-up    Patient has no complaints. Patient says he is doing good.     Referring provider: Marin Olp, MD  HPI: 66 year old male, never smoked. PMH significant for OSA, hypertension, obesity. Patient of Dr. Elsworth Soho. PSG  In [place] 08/2001- AHI of 14.9 events per hour. For supine sleep the AHI was 53.4 events per hour. Titration >> CPAP  9 cm.  Maintained on CPAP at 9cm H20 and Nuvigil for hypersomnia with sleep apnea.   Previous [facility] pulmonary encounter: 11/09/2018 Patient called today for annual Sleep apnea follow-up. He is doing well, states that he is 100% compliant with CPAP use. Download is unavailable and SD card unable to be accessed d/t E-vist. States that his CPAP does not have wifi. Got current machine from his father in law 3 years ago, he is unsure how old it is. No issue with pressure setting, currently set at 9cm H20. He has gone back to full face mask from nasal pillows. Feels he breaths easier with full face but does struggle with increased humidity with mask during the summer. States that he try's to turn down the humidity setting on his device but knob is broken.Takes Nuvigil M-F. Helps productivity at work. 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 He does not need refill of medication today.   04/24/2019 Patient presents today for 6 month follow-up. He is doing well. Received a new machine and airview shows 100% compliance with CPAP. No issues with mask fit or pressure setting. Continues to struggle with wearing He has had some difficulty with DME company [employer]. Reports less snoring and has stopped falling asleep at work. He is taking Nuvigil daily M-F and not always on the weekends.   Airview download 8/10-04/23/19: Usage days 30/30 days; 100% > 4 hours Average usage 6 hours 32 mins Pressure 9cm  H20 Min air leaks AHI 0.2  01/14/2021  Patient presents today for overdue follow-up, last seen in September 2020. He is requesting Nuvigil medication refill. He is doing well, complaint with CPAP use. No issues with mask or pressure setting. He gets on average 6-7 hours of sleep a night. Bedtime is between 9-9:30pm and he gets out of bed around 3:30-4am every day. He takes Nuvigil at 7am in the morning and this works well for him. He does not report excessive daytime fatigue or somnolence. Epworth score 3/24. Struggling with his weight. States that he needs more of a regular routine. He has some arthritis which prevents him for being as active as he would like.    Airview download 12/15/20-01/13/21 29/30 days (97%); 29 days (97%) > 4 hours Average usage 6 hours 41 mins Pressure 9cm h20 Leaks 7.6L/min AHI 0.1   08/30/2021- Interim hx  Patient presents today for 6 month follow-up/OSA. He is doing well, no acute complaints. No issues with pressure setting or mask fit. Currently CPAP pressure 9cm h20. Het gets on average 7 hours of sleep a night. Wakes up once to use restroom. He takes Nuvigil 150mg  daily Sunday-Saturday. He has no residual daytime sleepiness. Epworth 2/24.   Airview download 07/28/21-08/26/21 Usage 30/30 days; 30 days (100%) > 4 hours Average usage 7 hours Pressure 9cm h20 Airleaks 13.2L/min (95%) AHI 0.1   No Known Allergies  Immunization History  Administered Date(s) Administered   Influenza Split 04/26/2012, 06/07/2017   Influenza Whole 04/09/2010   Influenza,inj,Quad PF,6+ Mos 06/24/2014, 05/20/2019   Influenza-Unspecified 05/16/2015, 05/30/2018, 06/15/2020   Td 08/30/2005   Tdap 12/14/2015   Zoster Recombinat (Shingrix) 08/27/2018, 01/23/2019    Past Medical History:  Diagnosis Date   Arthritis    Left Big Toe    Depression    Hiccups    in the past/  Took Haloperidol to help   Hyperlipidemia    Hypertension    Pneumonia    as a baby   Sleep apnea      Tobacco History: Social History   Tobacco Use  Smoking Status Never  Smokeless Tobacco Never   Counseling given: Not Answered   Outpatient Medications Prior to Visit  Medication Sig Dispense Refill   hydrochlorothiazide (MICROZIDE) 12.5 MG capsule TAKE 1 CAPSULE BY MOUTH  DAILY 90 capsule 3   Multiple Vitamin (MULTIVITAMIN) tablet Take 1 tablet by mouth daily. Nature Made for men-Take one daily     Multiple Vitamins-Minerals (AIRBORNE PO) Take by mouth daily.     OVER THE COUNTER MEDICATION TBC Cream     rosuvastatin (CRESTOR) 20 MG tablet Take 1 tablet (20 mg total) by mouth daily. 90 tablet 3   Armodafinil 150 MG tablet Take 1 tablet (150 mg total) by mouth daily. 90 tablet 2   No facility-administered medications prior to visit.   Review of Systems  Review of Systems  Constitutional: Negative.   HENT: Negative.    Respiratory: Negative.    Cardiovascular: Negative.     Physical Exam  BP 122/68 (BP Location: Left Arm, Patient Position: Sitting, Cuff Size: Large)    Pulse 91    Temp 98 F (36.7 C) (Oral)    Ht 5\' 10"  (1.778 m)    Wt 219 lb 3.2 oz (99.4 kg)    SpO2 97%    BMI 31.45 kg/m  Physical Exam Constitutional:      General: He is not in acute distress.    Appearance: Normal appearance. He is not ill-appearing.  HENT:     Head: Normocephalic and atraumatic.  Cardiovascular:     Rate and Rhythm: Normal rate and regular rhythm.  Pulmonary:     Effort: Pulmonary effort is normal.     Breath sounds: Normal breath sounds. No wheezing or rhonchi.  Musculoskeletal:        General: Normal range of motion.  Skin:    General: Skin is warm and dry.  Neurological:     General: No focal deficit present.     Mental Status: He is alert and oriented to person, place, and time. Mental status is at baseline.  Psychiatric:        Mood and Affect: Mood normal.        Behavior: Behavior normal.        Thought Content: Thought content normal.        Judgment: Judgment  normal.     Lab Results:  CBC    Component Value Date/Time   WBC 8.4 10/01/2020 0847   RBC 5.33 10/01/2020 0847   HGB 17.0 10/01/2020 0847   HCT 49.1 10/01/2020 0847   PLT 263.0 10/01/2020 0847   MCV 92.1 10/01/2020 0847   MCHC 34.7 10/01/2020 0847   RDW 13.4 10/01/2020 0847   LYMPHSABS 2.5 10/01/2020 0847   MONOABS 0.6 10/01/2020 0847   EOSABS 0.2 10/01/2020 0847   BASOSABS 0.1 10/01/2020 0847    BMET  Component Value Date/Time   NA 141 10/01/2020 0847   K 4.4 10/01/2020 0847   CL 103 10/01/2020 0847   CO2 32 10/01/2020 0847   GLUCOSE 91 10/01/2020 0847   BUN 21 10/01/2020 0847   CREATININE 0.95 10/01/2020 0847   CALCIUM 9.9 10/01/2020 0847   GFRNONAA 84.37 03/25/2010 0844   GFRAA 92 12/06/2007 0945    BNP No results found for: BNP  ProBNP No results found for: PROBNP  Imaging: No results found.   Assessment & Plan:   OSA (obstructive sleep apnea) - PSG  In [place] 08/2001- AHI of 14.9 events per hour. For supine sleep the AHI was 53.4 events per hour. Titration >> CPAP  9 cm.  - Patient is 100% compliant with CPAP and reports benefit from use. No issues with mask fit or pressure setting. Pressure 9cm h20; Residual AHI 0.2.  No daytime sleepiness while taking Nuvigil 150mg  daily. He takes medication daily at 7am. Epworth 2/24. Refills sent to [facility]. PMP reviewed- no unexpected prescriptions found. Follow-up in 1 year or sooner if needed.   Martyn Ehrich, NP 08/30/2021

## 2021-08-30 NOTE — Patient Instructions (Signed)
Excellent CPAP complaint. No pressure changes today Continue Nuvigil as directed, refills sent (if any trouble getting medication refilled send mychart message or call us). Follow-up in 1 year, if you need a visit in the interim we can do virtual video visit.   Follow-up: 1 year with Dr. Elsworth Soho or Eustaquio Maize NP    CPAP and BIPAP Information CPAP and BIPAP are methods that use air pressure to keep your airways open and to help you breathe well. CPAP and BIPAP use different amounts of pressure. Your health care provider will tell you whether CPAP or BIPAP would be more helpful for you. CPAP stands for "continuous positive airway pressure." With CPAP, the amount of pressure stays the same while you breathe in (inhale) and out (exhale). BIPAP stands for "bi-level positive airway pressure." With BIPAP, the amount of pressure will be higher when you inhale and lower when you exhale. This allows you to take larger breaths. CPAP or BIPAP may be used in the hospital, or your health care provider may want you to use it at home. You may need to have a sleep study before your health care provider can order a machine for you to use at home. What are the advantages? CPAP or BIPAP can be helpful if you have: Sleep apnea. Chronic obstructive pulmonary disease (COPD). Heart failure. Medical conditions that cause muscle weakness, including muscular dystrophy or amyotrophic lateral sclerosis (ALS). Other problems that cause breathing to be shallow, weak, abnormal, or difficult. CPAP and BIPAP are most commonly used for obstructive sleep apnea (OSA) to keep the airways from collapsing when the muscles relax during sleep. What are the risks? Generally, this is a safe treatment. However, problems may occur, including: Irritated skin or skin sores if the mask does not fit properly. Dry or stuffy nose or nosebleeds. Dry mouth. Feeling gassy or bloated. Sinus or lung infection if the equipment is not cleaned  properly. When should CPAP or BIPAP be used? In most cases, the mask only needs to be worn during sleep. Generally, the mask needs to be worn throughout the night and during any daytime naps. People with certain medical conditions may also need to wear the mask at other times, such as when they are awake. Follow instructions from your health care provider about when to use the machine. What happens during CPAP or BIPAP? Both CPAP and BIPAP are provided by a small machine with a flexible plastic tube that attaches to a plastic mask that you wear. Air is blown through the mask into your nose or mouth. The amount of pressure that is used to blow the air can be adjusted on the machine. Your health care provider will set the pressure setting and help you find the best mask for you. Tips for using the mask Because the mask needs to be snug, some people feel trapped or closed-in (claustrophobic) when first using the mask. If you feel this way, you may need to get used to the mask. One way to do this is to hold the mask loosely over your nose or mouth and then gradually apply the mask more snugly. You can also gradually increase the amount of time that you use the mask. Masks are available in various types and sizes. If your mask does not fit well, talk with your health care provider about getting a different one. Some common types of masks include: Full face masks, which fit over the mouth and nose. Nasal masks, which fit over the nose. Nasal pillow  or prong masks, which fit into the nostrils. If you are using a mask that fits over your nose and you tend to breathe through your mouth, a chin strap may be applied to help keep your mouth closed. Use a skin barrier to protect your skin as told by your health care provider. Some CPAP and BIPAP machines have alarms that may sound if the mask comes off or develops a leak. If you have trouble with the mask, it is very important that you talk with your health care  provider about finding a way to make the mask easier to tolerate. Do not stop using the mask. There could be a negative impact on your health if you stop using the mask. Tips for using the machine Place your CPAP or BIPAP machine on a secure table or stand near an electrical outlet. Know where the on/off switch is on the machine. Follow instructions from your health care provider about how to set the pressure on your machine and when you should use it. Do not eat or drink while the CPAP or BIPAP machine is on. Food or fluids could get pushed into your lungs by the pressure of the CPAP or BIPAP. For home use, CPAP and BIPAP machines can be rented or purchased through home health care companies. Many different brands of machines are available. Renting a machine before purchasing may help you find out which particular machine works well for you. Your health insurance company may also decide which machine you may get. Keep the CPAP or BIPAP machine and attachments clean. Ask your health care provider for specific instructions. Check the humidifier if you have a dry stuffy nose or nosebleeds. Make sure it is working correctly. Follow these instructions at home: Take over-the-counter and prescription medicines only as told by your health care provider. Ask if you can take sinus medicine if your sinuses are blocked. Do not use any products that contain nicotine or tobacco. These products include cigarettes, chewing tobacco, and vaping devices, such as e-cigarettes. If you need help quitting, ask your health care provider. Keep all follow-up visits. This is important. Contact a health care provider if: You have redness or pressure sores on your head, face, mouth, or nose from the mask or head gear. You have trouble using the CPAP or BIPAP machine. You cannot tolerate wearing the CPAP or BIPAP mask. Someone tells you that you snore even when wearing your CPAP or BIPAP. Get help right away if: You have  trouble breathing. You feel confused. Summary CPAP and BIPAP are methods that use air pressure to keep your airways open and to help you breathe well. If you have trouble with the mask, it is very important that you talk with your health care provider about finding a way to make the mask easier to tolerate. Do not stop using the mask. There could be a negative impact to your health if you stop using the mask. Follow instructions from your health care provider about when to use the machine. This information is not intended to replace advice given to you by your health care provider. Make sure you discuss any questions you have with your health care provider. Document Revised: 03/10/2021 Document Reviewed: 07/10/2020 Elsevier Patient Education  2022 [employer].

## 2021-08-30 NOTE — Assessment & Plan Note (Addendum)
-   PSG  In [place] 08/2001- AHI of 14.9 events per hour. For supine sleep the AHI was 53.4 events per hour. Titration >> CPAP  9 cm.  - Patient is 100% compliant with CPAP and reports benefit from use. No issues with mask fit or pressure setting. Pressure 9cm h20; Residual AHI 0.2.  No daytime sleepiness while taking Nuvigil 150mg  daily. He takes medication daily at 7am. [place] 2/24. Refills sent to [facility]. PMP reviewed- no unexpected prescriptions found. Follow-up in 1 year or sooner if needed.

## 2021-09-14 ENCOUNTER — Telehealth: Payer: Self-pay | Admitting: Primary Care

## 2021-09-14 MED ORDER — ARMODAFINIL 150 MG PO TABS: 150.0000 mg | ORAL_TABLET | Freq: Every day | ORAL | 0 refills | Status: DC

## 2021-09-14 NOTE — Telephone Encounter (Signed)
Patient is requesting a 30 day Rx for Armodafinil 150 to be sent to [facility].  He stated that [facility] does not have Armodafinil in stock.   Beth, please advise. Thanks

## 2021-09-14 NOTE — Telephone Encounter (Signed)
Patient is aware of below message and voiced his understanding.  Nothing further needed.   

## 2021-09-14 NOTE — Telephone Encounter (Signed)
Sent!

## 2021-09-27 NOTE — Progress Notes (Signed)
Phone: [phone]   Subjective:  Patient presents today for their annual physical. Chief complaint-noted.   See problem oriented charting- ROS- full  review of systems was completed and negative  except for: tinnitus, voice change if gets tired fatigued/or not well hydrated- not a major concern- better if hydrates  The following were reviewed and entered/updated in epic: Past Medical History:  Diagnosis Date   Arthritis    Left Big Toe    Depression    Hiccups    in the past/  Took Haloperidol to help   Hyperlipidemia    Hypertension    Pneumonia    as a baby   Sleep apnea    Patient Active Problem List   Diagnosis Date Noted   History of melanoma 12/15/2020    Priority: High   Sleep apnea with hypersomnolence 04/24/2019    Priority: High   Essential hypertension 08/31/2015    Priority: Medium    Hyperlipidemia 05/11/2007    Priority: Medium    OSA (obstructive sleep apnea) 05/11/2007    Priority: Medium    Right calf pain 06/07/2018    Priority: Low   Obese 04/15/2015    Priority: Low   Right hip pain 01/19/2015    Priority: Low   History of basal cell cancer 05/10/2010    Priority: Low   Past Surgical History:  Procedure Laterality Date   COLONOSCOPY     none     no past surgeries    Family History  Problem Relation Age of Onset   Alcohol abuse Sister    Depression Mother    Alcohol abuse Mother    COPD Mother    Gallbladder disease Father    Heart disease Father        CHF- died from this   Polycythemia Father    Colon cancer Neg Hx    Rectal cancer Neg Hx     Medications- reviewed and updated Current Outpatient Medications  Medication Sig Dispense Refill   Armodafinil 150 MG tablet Take 1 tablet (150 mg total) by mouth daily. 30 tablet 0   hydrochlorothiazide (MICROZIDE) 12.5 MG capsule TAKE 1 CAPSULE BY MOUTH  DAILY 90 capsule 3   Multiple Vitamin (MULTIVITAMIN) tablet Take 1 tablet by mouth daily. Nature Made for men-Take one daily      Multiple Vitamins-Minerals (AIRBORNE PO) Take by mouth daily.     OVER THE COUNTER MEDICATION TBC Cream     rosuvastatin (CRESTOR) 20 MG tablet Take 1 tablet (20 mg total) by mouth daily. 90 tablet 3   No current facility-administered medications for this visit.    Allergies-reviewed and updated No Known Allergies  Social History   Social History Narrative   Married 2001. 2 stepchildren. Both grown and married  In mid to late 20s. 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 Oropharynx normal Neck: no thyromegaly CV: RRR no murmurs rubs or gallops Lungs: CTAB no crackles, wheeze, rhonchi Abdomen: soft/nontender/nondistended/normal bowel sounds. No rebound or guarding.  Ext: no edema Skin: warm, dry Neuro: grossly normal, moves all extremities, PERRLA    Assessment and Plan  66 y.o. male presenting for annual physical.  Health Maintenance counseling: 1. Anticipatory guidance: Patient counseled regarding regular dental exams -q6 months, eye exams  -yearly with [facility],  avoiding smoking and second hand smoke , limiting alcohol to 2 beverages per day-1 a month or less.  No illicit drugs  2. Risk factor reduction:  Advised patient of need for regular exercise and diet rich and fruits and vegetables to reduce risk of heart attack and stroke.  Exercise-  40 miles a week walking his 2 dogs still but they are slowing down some so not getting cardio he desires Diet/weight management- up 5 lbs. Last year rated himself as a B and now B- to a C. Daughter had moved in and was stressful. could improve on diet- increase veggies/fruits- and cut down on carbs Wt  Readings from Last 3 Encounters:  10/06/21 216 lb 3.2 oz (98.1 kg)  08/30/21 219 lb 3.2 oz (99.4 kg)  01/14/21 215 lb 3.2 oz (97.6 kg)   3. Immunizations/screenings/ancillary studies- fully up to date.  Immunization History  Administered Date(s) Administered   Influenza Split 04/26/2012, 06/07/2017   Influenza Whole 04/09/2010   Influenza,inj,Quad PF,6+ Mos 06/24/2014, 05/20/2019   Influenza-Unspecified 05/16/2015, 05/30/2018, 06/15/2020   PFIZER(Purple Top)SARS-COV-2 Vaccination 10/26/2019, 11/19/2019, 06/20/2020   Pfizer Covid-19 Vaccine Bivalent Booster 47yrs & up 06/19/2021   Td 08/30/2005   Tdap 12/14/2015   Zoster Recombinat (Shingrix) 08/27/2018, 01/23/2019   4. Prostate cancer screening- low risk PSA trend-continue to monitor with labs today. Rectal low risk last year- some BPH. Urinary frequency last year has improved- did cut down on caffeien/coffee Lab Results  Component Value Date   PSA 2.33 10/01/2020   PSA 1.74 10/01/2019   PSA 2.17 10/01/2018   5. Colon cancer screening - 04/29/19 with 10 year repeat planned 6. Skin cancer screening-due to history of melanoma every 3 months as well as basal cell carcinoma recommended regular follow-up-has seen Dr. Nevada Crane most recently. advised regular sunscreen use. Denies worrisome, changing, or new skin lesions.  7. Smoking associated screening (lung cancer screening, AAA screen 65-75, UA)- never smoker 8. STD screening -only active with wife so not needed   Status of chronic or acute concerns   #hypertension S: medication: hctz 12.5Mg  daily-Was on combination pill with lisinopril 10 mg in the past- tried off due to tinnitus but did not help  Home readings #s: also looking good at home BP Readings from Last 3 Encounters:  10/06/21 124/80  08/30/21 122/68  01/14/21 124/80  A/P: Controlled. Continue current medications.  -Did have some protein in his urine last year-discussed repeating this urinalysis and if persistent protein likely  complete urine microalbumin creatinine ratio  #hyperlipidemia-with peak LDL of134 on file S: Medication: Rosuvastatin 20Mg  Daily Lab Results  Component Value Date   CHOL 158 10/01/2020   HDL 55.10 10/01/2020   LDLCALC 81 10/01/2020   LDLDIRECT 127.1 04/10/2012   TRIG 107.0 10/01/2020   CHOLHDL 3 10/01/2020   A/P: Reasonable control last check with over 30% reduction in LDL from peak-continue current medication -discussed prediabetes risk  #Sleep apnea with hypersomnolence-patient on CPAP and on armodafinil follows with pulmonary.  Used so clean sanitizer for his machine.working with adapt.  - he would like a 2nd opinion- referral placed to Dr. Brett Fairy -he is also interested in the inspire device- we discussed getting Dr. Edwena Felty opinion  first but I could place a referral to Dr. Redmond Baseman if needed of ENT  #Cerumen impactions intermittently- seen Dr. Lucia Gaskins in the past. Tinnitus on and off in the past and does better with irrigation or Debrox  -Today, doing well recently    Recommended follow up: Return in about 1 year (around 10/06/2022) for physical or sooner if needed.  Lab/Order associations: fasting   ICD-10-CM   1. Preventative health care  Z00.00     2. Essential hypertension  I10     3. Hyperlipidemia, unspecified hyperlipidemia type  E78.5     4. Sleep apnea with hypersomnolence  G47.10    G47.30     5. Screening for prostate cancer  Z12.5       No orders of the defined types were placed in this encounter.   I,Jada Bradford,acting as a scribe for Garret Reddish, MD.,have documented all relevant documentation on the behalf of Garret Reddish, MD,as directed by  Garret Reddish, MD while in the presence of Garret Reddish, MD.  I, Garret Reddish, MD, have reviewed all documentation for this visit. The documentation on 10/06/21 for the exam, diagnosis, procedures, and orders are all accurate and complete.  Return precautions advised.  Garret Reddish, MD

## 2021-10-06 ENCOUNTER — Other Ambulatory Visit: Payer: Self-pay

## 2021-10-06 ENCOUNTER — Encounter: Payer: Self-pay | Admitting: Family Medicine

## 2021-10-06 ENCOUNTER — Ambulatory Visit (INDEPENDENT_AMBULATORY_CARE_PROVIDER_SITE_OTHER): Payer: 59 | Admitting: Family Medicine

## 2021-10-06 VITALS — BP 124/80 | HR 80 | Temp 97.7°F | Ht 70.0 in | Wt 216.2 lb

## 2021-10-06 DIAGNOSIS — E785 Hyperlipidemia, unspecified: Secondary | ICD-10-CM | POA: Diagnosis not present

## 2021-10-06 DIAGNOSIS — I10 Essential (primary) hypertension: Secondary | ICD-10-CM | POA: Diagnosis not present

## 2021-10-06 DIAGNOSIS — G4710 Hypersomnia, unspecified: Secondary | ICD-10-CM | POA: Diagnosis not present

## 2021-10-06 DIAGNOSIS — G4730 Sleep apnea, unspecified: Secondary | ICD-10-CM

## 2021-10-06 DIAGNOSIS — Z0000 Encounter for general adult medical examination without abnormal findings: Secondary | ICD-10-CM

## 2021-10-06 DIAGNOSIS — Z125 Encounter for screening for malignant neoplasm of prostate: Secondary | ICD-10-CM | POA: Diagnosis not present

## 2021-10-06 LAB — CBC WITH DIFFERENTIAL/PLATELET
Basophils Absolute: 0.1 10*3/uL (ref 0.0–0.1)
Basophils Relative: 0.6 % (ref 0.0–3.0)
Eosinophils Absolute: 0.2 10*3/uL (ref 0.0–0.7)
Eosinophils Relative: 2 % (ref 0.0–5.0)
HCT: 50.4 % (ref 39.0–52.0)
Hemoglobin: 17.3 g/dL — ABNORMAL HIGH (ref 13.0–17.0)
Lymphocytes Relative: 26.5 % (ref 12.0–46.0)
Lymphs Abs: 2.4 10*3/uL (ref 0.7–4.0)
MCHC: 34.4 g/dL (ref 30.0–36.0)
MCV: 92.5 fl (ref 78.0–100.0)
Monocytes Absolute: 0.6 10*3/uL (ref 0.1–1.0)
Monocytes Relative: 6.9 % (ref 3.0–12.0)
Neutro Abs: 5.7 10*3/uL (ref 1.4–7.7)
Neutrophils Relative %: 64 % (ref 43.0–77.0)
Platelets: 243 10*3/uL (ref 150.0–400.0)
RBC: 5.45 Mil/uL (ref 4.22–5.81)
RDW: 13.2 % (ref 11.5–15.5)
WBC: 8.9 10*3/uL (ref 4.0–10.5)

## 2021-10-06 LAB — COMPREHENSIVE METABOLIC PANEL
ALT: 28 U/L (ref 0–53)
AST: 23 U/L (ref 0–37)
Albumin: 4.8 g/dL (ref 3.5–5.2)
Alkaline Phosphatase: 74 U/L (ref 39–117)
BUN: 26 mg/dL — ABNORMAL HIGH (ref 6–23)
CO2: 33 mEq/L — ABNORMAL HIGH (ref 19–32)
Calcium: 10.3 mg/dL (ref 8.4–10.5)
Chloride: 103 mEq/L (ref 96–112)
Creatinine, Ser: 0.98 mg/dL (ref 0.40–1.50)
GFR: 82.04 mL/min (ref >60.00)
Glucose, Bld: 94 mg/dL (ref 70–99)
Potassium: 4.9 mEq/L (ref 3.5–5.1)
Sodium: 143 mEq/L (ref 135–145)
Total Bilirubin: 0.7 mg/dL (ref 0.2–1.2)
Total Protein: 6.9 g/dL (ref 6.0–8.3)

## 2021-10-06 LAB — POC URINALSYSI DIPSTICK (AUTOMATED)
Bilirubin, UA: NEGATIVE
Blood, UA: NEGATIVE
Glucose, UA: NEGATIVE
Ketones, UA: NEGATIVE
Leukocytes, UA: NEGATIVE
Nitrite, UA: NEGATIVE
Protein, UA: NEGATIVE
Spec Grav, UA: 1.02 (ref 1.010–1.025)
Urobilinogen, UA: 0.2 E.U./dL
pH, UA: 6 (ref 5.0–8.0)

## 2021-10-06 LAB — LIPID PANEL
Cholesterol: 166 mg/dL (ref 0–200)
HDL: 51.9 mg/dL (ref >39.00)
LDL Cholesterol: 92 mg/dL (ref 0–99)
NonHDL: 113.81
Total CHOL/HDL Ratio: 3
Triglycerides: 111 mg/dL (ref 0.0–149.0)
VLDL: 22.2 mg/dL (ref 0.0–40.0)

## 2021-10-06 LAB — PSA: PSA: 2.29 ng/mL (ref 0.10–4.00)

## 2021-10-06 MED ORDER — ROSUVASTATIN CALCIUM 20 MG PO TABS: 20.0000 mg | ORAL_TABLET | Freq: Every day | ORAL | 3 refills | Status: DC

## 2021-10-06 MED ORDER — HYDROCHLOROTHIAZIDE 12.5 MG PO CAPS: ORAL_CAPSULE | ORAL | 3 refills | Status: DC

## 2021-10-06 NOTE — Patient Instructions (Addendum)
We will call you within two weeks about your referral to Dr. Brett Fairy [facility] for 2nd opinion on sleep apnea. If you do not hear within 2 weeks, give [place] a call or you can call them directly  Priority #1 for today- sit down after visit and schedule time off before you lose those days. Without giving yourself more space- its going to be hard to make dietary changes to improve your health.   Please stop by lab before you go If you have mychart- we will send your results within 3 business days of [place] receiving them.  If you do not have mychart- we will call you about results within 5 business days of [place] receiving them.  *please also note that you will see labs on mychart as soon as they post. I will later go in and write notes on them- will say "notes from Dr. Yong Channel"   Recommended follow up: Return in about 1 year (around 10/06/2022) for physical or sooner if needed.

## 2021-12-08 ENCOUNTER — Encounter: Payer: Self-pay | Admitting: Neurology

## 2021-12-08 ENCOUNTER — Ambulatory Visit (INDEPENDENT_AMBULATORY_CARE_PROVIDER_SITE_OTHER): Payer: 59 | Admitting: Neurology

## 2021-12-08 VITALS — BP 146/83 | HR 77 | Ht 70.0 in | Wt 219.5 lb

## 2021-12-08 DIAGNOSIS — G4733 Obstructive sleep apnea (adult) (pediatric): Secondary | ICD-10-CM | POA: Diagnosis not present

## 2021-12-08 DIAGNOSIS — F5111 Primary hypersomnia: Secondary | ICD-10-CM | POA: Diagnosis not present

## 2021-12-08 DIAGNOSIS — Z9989 Dependence on other enabling machines and devices: Secondary | ICD-10-CM | POA: Diagnosis not present

## 2021-12-08 MED ORDER — ARMODAFINIL 150 MG PO TABS: 150.0000 mg | ORAL_TABLET | Freq: Every day | ORAL | 3 refills | Status: DC

## 2021-12-08 NOTE — Progress Notes (Signed)
? ? ?SLEEP MEDICINE CLINIC ?  ? ?Provider:  Larey Seat, MD  ?Primary Care Physician:  Marin Olp, MD ?[address] ?[place] [address]  ? ?  ?Referring Provider: Marin Olp, Md ?[address] ?[place],   [id number]  ?  ?  ?    ?Chief Complaint according to patient   ?Patient presents with:  ?  ? New Patient (Initial Visit)  ?   Referral for OSA. Last sleep study more than 10 years ago. Interested in inspire device. Uses current CPAP every night.   ?  ?  ?HISTORY OF PRESENT ILLNESS:  ?Vincent Wilkerson is a 66 y.o. year old White or Caucasian male patient seen here as a referral on 12/08/2021 from PCP for a second opinion on OSA care.Marland Kitchen  ?Chief concern according to patient :  " I am doing well on CPAP , except I can't nap- I choke still , I wake up, and its a hustle to travel with. I still need Modafinil and my doctor doesn't refill it , I have to call every month and there no refills. " ?I though of Inspire as an alternative.  ?  ?I have the pleasure of seeing Vincent Wilkerson today, a right -handed White or Caucasian male with a possible sleep disorder. He  has a past medical history of Left toe osteo-Arthritis, Depression, Hiccups, Hyperlipidemia, Hypertension, infant -Pneumonia , Tinnitus, Covid in early 2020, Foot and ankle injuries and surgeries, and  OSA-Sleep apnea. ?  ?The patient had the first sleep study in [place]  the year 2000  with a result of OSA- none since.  ?  ?Sleep relevant medical history: Persistent hypersomnia. No Tonsillectomy, Football Concussion-   ? Family medical /sleep history: father , snorer and most likely- OSA, died of CHF , heavy smoker.  ?  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  2 grown children, no grandchildren.  ?The patient currently works days but used to work in shifts( Presenter, broadcasting,) ?Tobacco use- none .  ETOH use ; pone a month,  ?Caffeine intake in form of  Coffee( 2-3 a day . ?Regular exercise in form of walking the dogs, 5 miles a day.   ? ? ?  ?  ?Sleep habits are as follows: The patient's dinner time is between 5-6 PM. The patient goes to bed at 9 PM and continues to sleep for two intervals of 4 hours, wakes for one bathroom break, the first time at 4 AM.   ?The preferred sleep position is side , with the support of 1 pillow.  ?Dreams are reportedly frequent/vivid.  ? 4 AM is the usual rise time. The patient wakes up spontaneously. ?He reports mostly feeling refreshed or restored in AM, without symptoms such as dry mouth, morning headaches, stiffness is present.  ?Naps are taken infrequently, lasting from 45 to 60 minutes and are no more refreshing than nocturnal sleep.  ?  ?Review of Systems: ?Out of a complete 14 system review, the patient complains of only the following symptoms, and all other reviewed systems are negative.:  ?Fatigue, sleepiness , snoring, ?  ?How likely are you to doze in the following situations: ?0 = not likely, 1 = slight chance, 2 = moderate chance, 3 = high chance ?  ?Sitting and Reading? 2 ?Watching Television? 3 ?Sitting inactive in a public place (theater or meeting)?1 ?As  a passenger in a car for an hour without a break?1 ?Lying down in the afternoon when circumstances permit? 2 ?Sitting and talking to someone?0 ?Sitting quietly after lunch without alcohol? 2 ?In a car, while stopped for a few minutes in traffic?0 ?  ?Total = 11-14/ 24 points  when off NUVIGIL  ? FSS endorsed at 47/ 63 points.  ? ?Social History  ? ?Socioeconomic History  ? Marital status: Married  ?  Spouse name: Not on file  ? Number of children: Not on file  ? Years of education: Not on file  ? Highest education level: Not on file  ?Occupational History  ? Not on file  ?Tobacco Use  ? Smoking status: Never  ? Smokeless tobacco: Never  ?Substance and Sexual Activity  ? Alcohol use: Yes  ?  Alcohol/week: 0.0 standard drinks  ?  Comment: rare  ? Drug use: No  ? Sexual  activity: Not on file  ?Other Topics Concern  ? Not on file  ?Social History Narrative  ? Right handed  ? 3-4 cups caffeine per day (coffee)  ?   ? Married 2001. 2 stepchildren. Both grown and married  In mid to late 20s. 1 lab collie mix- getting older in 2021  ?   ? 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   ? Hobbies: resting, walking dog, hiking, would love to do more woodwork  ? ?Social Determinants of Health  ? ?Financial Resource Strain: Not on file  ?Food Insecurity: Not on file  ?Transportation Needs: Not on file  ?Physical Activity: Not on file  ?Stress: Not on file  ?Social Connections: Not on file  ? ? ?Family History  ?Problem Relation Age of Onset  ? Depression Mother   ? Alcohol abuse Mother   ? COPD Mother   ? Gallbladder disease Father   ? Heart disease Father   ?     CHF- died from this  ? Polycythemia Father   ? Alcohol abuse Sister   ? Colon cancer Neg Hx   ? Rectal cancer Neg Hx   ? ? ?Past Medical History:  ?Diagnosis Date  ? Arthritis   ? Left Big Toe   ? Depression   ? Hiccups   ? in the past/  Took Haloperidol to help  ? Hyperlipidemia   ? Hypertension   ? Pneumonia   ? as a baby  ? Sleep apnea   ? ? ?Past Surgical History:  ?Procedure Laterality Date  ? COLONOSCOPY    ? MELANOMA EXCISION    ? Back  ?  ? ?Current Outpatient Medications on File Prior to Visit  ?Medication Sig Dispense Refill  ? Armodafinil 150 MG tablet Take 1 tablet (150 mg total) by mouth daily. 30 tablet 0  ? hydrochlorothiazide (MICROZIDE) 12.5 MG capsule TAKE 1 CAPSULE BY MOUTH  DAILY 90 capsule 3  ? Multiple Vitamin (MULTIVITAMIN) tablet Take 1 tablet by mouth daily. Nature Made for men-Take one daily    ? Multiple Vitamins-Minerals (AIRBORNE PO) Take by mouth daily.    ? rosuvastatin (CRESTOR) 20 MG tablet Take 1 tablet (20 mg total) by mouth daily. 90 tablet 3  ? ?No current facility-administered medications on file prior to visit.  ? ? ?No Known Allergies ? ?Physical exam: ? ?Today's Vitals  ? 12/08/21 0839  ?BP: (!) 146/83   ?Pulse: 77  ?Weight: 219 lb 8 oz (99.6 kg)  ?Height: '5\' 10"'$  (1.778 m)  ? ?Body mass index is 31.49 kg/m?Marland Kitchen  ? ?  Wt Readings from Last 3 Encounters:  ?12/08/21 219 lb 8 oz (99.6 kg)  ?10/06/21 216 lb 3.2 oz (98.1 kg)  ?08/30/21 219 lb 3.2 oz (99.4 kg)  ?  ? ?Ht Readings from Last 3 Encounters:  ?12/08/21 '5\' 10"'$  (1.778 m)  ?10/06/21 '5\' 10"'$  (1.778 m)  ?08/30/21 '5\' 10"'$  (1.778 m)  ?  ?  ?General: The patient is awake, alert and appears not in acute distress. The patient is well groomed. ?Head: Normocephalic, atraumatic. Neck is supple. ? Mallampati 2, lateral narrowing ,  ?neck circumference:19 inches .  ?Nasal airflow  patent.  Retrognathia is  not seen.  ?Dental status: biological  ?Cardiovascular:  Regular rate and cardiac rhythm by pulse,  without distended neck veins. ?Respiratory: Lungs are clear to auscultation.  ?Skin:  Without evidence of ankle edema, or rash. ?Trunk: The patient's posture is erect. ?  ?Neurologic exam : ?The patient is awake and alert, oriented to place and time.   ?Memory subjective described as intact.  ?Attention span & concentration ability appears normal.  ?Speech is fluent,  without  dysarthria, dysphonia or aphasia.  ?Mood and affect are appropriate. ?  ?Cranial nerves: no loss of smell or taste reported  ?Pupils are equal and briskly reactive to light. Funduscopic exam deferred.  ?Extraocular movements in vertical and horizontal planes were intact and without nystagmus. No Diplopia. ?Visual fields by finger perimetry are intact. ?Hearing was intact to tuning fork   ? Facial sensation intact to fine touch. ? Facial motor strength is symmetric and tongue and uvula move midline.  ?Neck ROM : rotation, tilt and flexion extension were normal for age and shoulder shrug was symmetrical.  ?  ?Motor exam:  Symmetric bulk, tone and ROM.   ?Normal tone without cog -wheeling, symmetric grip strength . ?  ?Sensory:  Fine touch, pinprick and vibration were tested  and reduced over the left foot.   ?Proprioception tested in the upper extremities was normal. ?  ?Coordination: Rapid alternating movements in the fingers/hands were of normal speed.  ?  ?Gait and station: Patient could rise unassisted from a seat

## 2021-12-08 NOTE — Addendum Note (Signed)
Addended by: Larey Seat on: 12/08/2021 09:58 AM ? ? Modules accepted: Orders ? ?

## 2021-12-15 ENCOUNTER — Telehealth: Payer: Self-pay

## 2021-12-15 NOTE — Telephone Encounter (Signed)
LVM for pt to call me back to schedule sleep study  

## 2021-12-23 ENCOUNTER — Telehealth: Payer: Self-pay

## 2021-12-23 NOTE — Telephone Encounter (Signed)
LVM for pt to call me back to schedule sleep study  

## 2021-12-28 ENCOUNTER — Telehealth: Payer: Self-pay

## 2021-12-28 NOTE — Telephone Encounter (Signed)
We have attempted to call the patient 2 times to schedule sleep study. Patient has been unavailable at the phone numbers we have on file and has not returned our calls. If patient calls back we will schedule them for their sleep study. ° °

## 2022-05-09 ENCOUNTER — Encounter: Payer: Self-pay | Admitting: *Deleted

## 2022-05-13 ENCOUNTER — Encounter: Payer: Self-pay | Admitting: Neurology

## 2022-05-18 ENCOUNTER — Ambulatory Visit: Payer: 59 | Admitting: Neurology

## 2022-05-18 ENCOUNTER — Telehealth: Payer: Self-pay | Admitting: Neurology

## 2022-05-18 DIAGNOSIS — G4733 Obstructive sleep apnea (adult) (pediatric): Secondary | ICD-10-CM

## 2022-05-18 DIAGNOSIS — G4710 Hypersomnia, unspecified: Secondary | ICD-10-CM

## 2022-05-18 DIAGNOSIS — F5111 Primary hypersomnia: Secondary | ICD-10-CM

## 2022-05-18 DIAGNOSIS — G478 Other sleep disorders: Secondary | ICD-10-CM

## 2022-05-18 NOTE — Telephone Encounter (Signed)
That patient is getting the mail out HST. It will be mailed out on 05/19/22.

## 2022-05-18 NOTE — Telephone Encounter (Signed)
Mail out [facility] no Josem Kaufmann req will be mailed out on 05/19/22.

## 2022-05-24 NOTE — Progress Notes (Signed)
[facility]. [facility]   HOME SLEEP TEST REPORT ( by Watch PAT)   STUDY DATE:  05-18-2022   ORDERING CLINICIAN: Larey Seat, MD  REFERRING CLINICIAN: Dr Yong Channel, MD   CLINICAL INFORMATION/HISTORY:  Seen upon referral on 12/08/2021 from PCP for a second opinion on OSA care. Chief concern according to patient :  " I am doing well on CPAP , except I can't nap- I choke still , I wake up, and its a hustle to travel with. I still need Modafinil and my doctor doesn't refill it , I have to call every month and there no refills.I though of Inspire as an alternative".    I have the pleasure of seeing Vincent Wilkerson today, a right -handed Caucasian male with a possible sleep disorder. He has a past medical history of Left toe osteo-Arthritis, Depression, Hiccups, Hyperlipidemia, Hypertension, Pneumonia , Tinnitus, Covid in early 2020, Foot and ankle injuries and surgeries, and  OSA-Sleep apnea.The patient had the first and last sleep study in [place] in the year 2000,  with a result of OSA- none since.      Epworth sleepiness score: 11 /24 points on Nuvigil, 14/ 24 points when off. FSS at 47/ 63 points.    BMI: 31.5 kg/m   Neck Circumference: 19   FINDINGS:    Total Recording Time (hours, min):   8 h and 1 m     Total Sleep Time (hours, min):    6 h 19 m             Percent REM (%):  13.2%                                      Respiratory Indices:   Calculated pAHI (per hour):    4.6/h                          REM pAHI:   1.2/h                                              NREM pAHI:  5.6/h                            Positional AHI:   supine sleep AHI was 5.4/h.  Snoring mean Volume was 46 dB, and snoring was present for 60% of sleep time.                                              Oxygen Saturation Statistics:   O2 Saturation Range (%): between a nadir at 88% and maximum sat at 99% , Mean saturation at 94%.                                      O2  Saturation (minutes) <89%: 0.1 minute.            Pulse Rate Statistics:  Pulse Range:   From 37 bpm through 99 bpm. Mean Heart Rate was 63 bpm.           IMPRESSION:  This HST documented an AHI less than 5/h which would not require CPAP or other intervention.  There is louder snoring present and NREM AHI was higher than REM AHI- the patient may try a dental device to reduce snoring.    RECOMMENDATION: There is not enough apnea present to justify an intervention by Inspire or CPAP device.     INTERPRETING PHYSICIAN:   Larey Seat, MD   Medical Director of [facility] at [facility].

## 2022-05-26 ENCOUNTER — Ambulatory Visit (INDEPENDENT_AMBULATORY_CARE_PROVIDER_SITE_OTHER): Payer: 59

## 2022-05-26 DIAGNOSIS — Z23 Encounter for immunization: Secondary | ICD-10-CM

## 2022-05-30 DIAGNOSIS — G478 Other sleep disorders: Secondary | ICD-10-CM | POA: Insufficient documentation

## 2022-05-30 NOTE — Procedures (Signed)
[facility]. [facility]   HOME SLEEP TEST REPORT ( by Watch PAT)   STUDY DATE:  05-18-2022   ORDERING CLINICIAN: Larey Seat, MD  REFERRING CLINICIAN: Dr Yong Channel, MD   CLINICAL INFORMATION/HISTORY:  Seen upon referral on 12/08/2021 from PCP for a second opinion on OSA care. Chief concern according to patient :  " I am doing well on CPAP , except I can't nap- I choke still , I wake up, and its a hustle to travel with. I still need Modafinil and my doctor doesn't refill it , I have to call every month and there no refills.I though of Inspire as an alternative".    I have the pleasure of seeing Vincent Wilkerson today, a right -handed Caucasian male with a possible sleep disorder. He has a past medical history of Left toe osteo-Arthritis, Depression, Hiccups, Hyperlipidemia, Hypertension, Pneumonia , Tinnitus, Covid in early 2020, Foot and ankle injuries and surgeries, and  OSA-Sleep apnea.The patient had the first and last sleep study in [place] in the year 2000,  with a result of OSA- none since.      Epworth sleepiness score: 11 /24 points on Nuvigil, 14/ 24 points when off. FSS at 47/ 63 points.    BMI: 31.5 kg/m   Neck Circumference: 19   FINDINGS:    Total Recording Time (hours, min):   8 h and 1 m     Total Sleep Time (hours, min):    6 h 19 m             Percent REM (%):  13.2%                                      Respiratory Indices:   Calculated pAHI (per hour):    4.6/h                          REM pAHI:   1.2/h                                              NREM pAHI:  5.6/h                            Positional AHI:   supine sleep AHI was 5.4/h.  Snoring mean Volume was 46 dB, and snoring was present for 60% of sleep time.                                              Oxygen Saturation Statistics:   O2 Saturation Range (%): between a nadir at 88% and maximum sat at 99% , Mean saturation at 94%.                                      O2  Saturation (minutes) <89%: 0.1 minute.            Pulse Rate Statistics:            Pulse  Range:   From 37 bpm through 99 bpm. Mean Heart Rate was 63 bpm.           IMPRESSION:  This HST documented an AHI less than 5/h which would not require CPAP or other intervention.  There is louder snoring present and NREM AHI was higher than REM AHI- the patient may try a dental device to reduce snoring.    RECOMMENDATION: There is not enough apnea present to justify an intervention by Inspire or CPAP device.     INTERPRETING PHYSICIAN:   Larey Seat, MD   Medical Director of [facility] at [facility].

## 2022-05-31 ENCOUNTER — Telehealth: Payer: Self-pay | Admitting: *Deleted

## 2022-05-31 NOTE — Telephone Encounter (Signed)
LVM for pt. Advised I will send him mychart message.

## 2022-05-31 NOTE — Telephone Encounter (Signed)
-----   Message from Larey Seat, MD sent at 05/30/2022 12:56 PM EDT ----- IMPRESSION:  This HST documented an AHI less than 5/h which would not require CPAP or other intervention.  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   RECOMMENDATION: There is not enough apnea present to justify an intervention by Inspire or CPAP device. Dental device can be tried.

## 2022-05-31 NOTE — Telephone Encounter (Signed)
Called and spoke w/ pt. Relayed results per Dr. Edwena Felty note. He verbalized understanding. He asked if he needs f/u. He was prescribed armodafinil '150mg'$  po qhs but sometimes takes more than one a day (couple times a month). Wants to know if dose can be increased. He averages 5-6hr of sleep per night. Financially, cannot do dental device currently. Will discuss with dentist at next appt around December 2023.   Confirmed he did not use CPAP night of HST. Advised he should d/c CPAP. He is concerned about doing this until set up with dental device for snoring. He also feels he cannot work without armodafinil. Advised since he does not have dx of sleep apnea at this point, insurance will likely not cover armodafinil. Did discuss goodrx coupon being option to bring cost down.   Aware I will discuss with MD. He has meetings later today and ok with [place] sending mychart message with her recommendations.

## 2022-06-01 NOTE — Telephone Encounter (Signed)
Called the patient and there was no answer. Left a detailed message advising that Vincent Wilkerson had reached out to him but that we we wanting to bring him in for a follow up visit with Dr Brett Fairy. The opening was 06/02/22 at 2 pm.  There was no answer LVM asking the patient to either reply to [facility] message or call the office back to let us know by 1 pm if he can take this opening. Advised that we would offer to someone else if he is unable to respond.

## 2022-06-02 ENCOUNTER — Encounter: Payer: Self-pay | Admitting: Neurology

## 2022-06-02 ENCOUNTER — Ambulatory Visit (INDEPENDENT_AMBULATORY_CARE_PROVIDER_SITE_OTHER): Payer: 59 | Admitting: Neurology

## 2022-06-02 VITALS — BP 155/76 | HR 76 | Ht 70.0 in | Wt 220.0 lb

## 2022-06-02 DIAGNOSIS — G4719 Other hypersomnia: Secondary | ICD-10-CM | POA: Diagnosis not present

## 2022-06-02 DIAGNOSIS — G4733 Obstructive sleep apnea (adult) (pediatric): Secondary | ICD-10-CM | POA: Diagnosis not present

## 2022-06-02 MED ORDER — ARMODAFINIL 150 MG PO TABS: 150.0000 mg | ORAL_TABLET | Freq: Every day | ORAL | 3 refills | Status: DC

## 2022-06-02 NOTE — Progress Notes (Signed)
SLEEP MEDICINE CLINIC    Provider:  Larey Seat, MD  Primary Care Physician:  Marin Olp, [place] [address]     Referring Provider: Marin Olp, [address] [place],  [place] [id number]          Chief Complaint according to patient   Patient presents with:     New Patient (Initial Visit)     HST was AHI 4.5/h ut when he stopped using CPAP he had apneas, snoring.. Uses current CPAP every night.       HISTORY OF PRESENT ILLNESS:  Vincent Wilkerson is a 66 y.o. Caucasian male patient seen here as a referral on 06/02/2022-  He was doing very well with CPAP - was 100% compliant user with an average use at time of 6 hours 14 minutes at 9 cm set pressure set up date was November 15, 2018 yet he was set up with an AirSense 10 at the time the mean residual apnea is only 0.1/h no central apneas arising and I think this is an excellent resolution there is very little air leakage.  Current Epworth sleepiness score is lower than it was at the time prior to CPAP use but he has taken Nuvigil to achieve this reduction.    His HST in 10- 11-2021 had only documented an AHI of 4.6/h and we allowed the patient to d/c CPAP but he is excessively sleepy and fatigued again and now wonders if the night was representative. He also was achy sore and sweaty- he couldn't sleep well, called it "a horrible night. ". Without the diagnoses of OSA he also ain't get Nuvigil.   We will repeat the HST. BMI is 31.6. BP 155/ 76      I originally saw him on 12-08-2021: from PCP for a second opinion on OSA care..  Chief concern according to patient :  " I am doing well on CPAP , except I can't nap- I choke still , I wake up, and its a hustle to travel with. I still need Modafinil and my doctor doesn't refill it , I have to call every month and there no refills. " I though of Inspire as an alternative.    I have the pleasure of seeing Vincent Wilkerson today, a right -handed  White or Caucasian male with a possible sleep disorder. He  has a past medical history of Left toe osteo-Arthritis, Depression, Hiccups, Hyperlipidemia, Hypertension, infant -Pneumonia , Tinnitus, Covid in early 2020, Foot and ankle injuries and surgeries, and  OSA-Sleep apnea.   The patient had the first sleep study in [place]  the year 2000  with a result of OSA- none since.    Sleep relevant medical history: Persistent hypersomnia. No Tonsillectomy, Football Concussion-    Family medical /sleep history: father , snorer and most likely- OSA, died of CHF , heavy smoker.    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  2 grown children, no grandchildren.  The patient currently works days but used to work in shifts( Presenter, broadcasting,) Tobacco use- none .  ETOH use ; pone a month,  Caffeine intake in form of Coffee( 2-3 a day . Regular exercise in form of walking the dogs, 5 miles a day.         Sleep habits are as follows: The patient's dinner time is between  5-6 PM. The patient goes to bed at 9 PM and continues to sleep for two intervals of 4 hours, wakes for one bathroom break, the first time at 4 AM.   The preferred sleep position is side , with the support of 1 pillow.  Dreams are reportedly frequent/vivid.   4 AM is the usual rise time. The patient wakes up spontaneously. He reports mostly feeling refreshed or restored in AM, without symptoms such as dry mouth, morning headaches, stiffness is present.  Naps are taken infrequently, lasting from 45 to 60 minutes and are no more refreshing than nocturnal sleep.    Review of Systems: Out of a complete 14 system review, the patient complains of only the following symptoms, and all other reviewed systems are negative.:  Fatigue, sleepiness , snoring,   How likely are you to doze in the following situations: 0 = not likely, 1 = slight chance, 2 = moderate chance, 3 =  high chance   Sitting and Reading? 2 Watching Television? 3 Sitting inactive in a public place (theater or meeting)?1 As a passenger in a car for an hour without a break?1 Lying down in the afternoon when circumstances permit? 2 Sitting and talking to someone?0 Sitting quietly after lunch without alcohol? 2 In a car, while stopped for a few minutes in traffic?0   Total = 11-14/ 24 points  when off NUVIGIL   He went back on his CPAP- sleeps so much better    FSS endorsed at 37/ 63 points. From 47/  Social History   Socioeconomic History   Marital status: Married    Spouse name: Not on file   Number of children: Not on file   Years of education: Not on file   Highest education level: Not on file  Occupational History   Not on file  Tobacco Use   Smoking status: Never   Smokeless tobacco: Never  Substance and Sexual Activity   Alcohol use: Yes    Alcohol/week: 0.0 standard drinks of alcohol    Comment: rare   Drug use: No   Sexual activity: Not on file  Other Topics Concern   Not on file  Social History Narrative   Right handed   3-4 cups caffeine per day (coffee)      Married 2001. 2 stepchildren. Both grown and married  In mid to late 20s. 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   Social Determinants of Radio broadcast assistant Strain: Not on file  Food Insecurity: Not on file  Transportation Needs: Not on file  Physical Activity: Not on file  Stress: Not on file  Social Connections: Not on file    Family History  Problem Relation Age of Onset   Depression Mother    Alcohol abuse Mother    COPD Mother    Gallbladder disease Father    Heart disease Father        CHF- died from this   Polycythemia Father    Alcohol abuse Sister    Colon cancer Neg Hx    Rectal cancer Neg Hx     Past Medical History:  Diagnosis Date   Arthritis    Left Big  Toe    Depression    Hiccups    in the past/  Took Haloperidol to help   Hyperlipidemia  Hypertension    Pneumonia    as a baby   Sleep apnea     Past Surgical History:  Procedure Laterality Date   COLONOSCOPY     MELANOMA EXCISION     Back     Current Outpatient Medications on File Prior to Visit  Medication Sig Dispense Refill   Armodafinil 150 MG tablet Take 1 tablet (150 mg total) by mouth daily. 90 tablet 3   hydrochlorothiazide (MICROZIDE) 12.5 MG capsule TAKE 1 CAPSULE BY MOUTH  DAILY 90 capsule 3   Multiple Vitamin (MULTIVITAMIN) tablet Take 1 tablet by mouth daily. Nature Made for men-Take one daily     Multiple Vitamins-Minerals (AIRBORNE PO) Take by mouth daily.     rosuvastatin (CRESTOR) 20 MG tablet Take 1 tablet (20 mg total) by mouth daily. 90 tablet 3   No current facility-administered medications on file prior to visit.    No Known Allergies  Physical exam:  Today's Vitals   06/02/22 1357  BP: (!) 155/76  Pulse: 76  Weight: 220 lb (99.8 kg)  Height: '5\' 10"'$  (1.778 m)   Body mass index is 31.57 kg/m.   Wt Readings from Last 3 Encounters:  06/02/22 220 lb (99.8 kg)  12/08/21 219 lb 8 oz (99.6 kg)  10/06/21 216 lb 3.2 oz (98.1 kg)     Ht Readings from Last 3 Encounters:  06/02/22 '5\' 10"'$  (1.778 m)  12/08/21 '5\' 10"'$  (1.778 m)  10/06/21 '5\' 10"'$  (1.778 m)      General: The patient is awake, alert and appears not in acute distress. The patient is well groomed. Head: Normocephalic, atraumatic. Neck is supple.  Mallampati 2, lateral narrowing ,  neck circumference:19 inches .  Nasal airflow  patent.  Retrognathia is  not seen.  Dental status: biological  Cardiovascular:  Regular rate and cardiac rhythm by pulse,  without distended neck veins. Respiratory: Lungs are clear to auscultation.  Skin:  Without evidence of ankle edema, or rash. Trunk: The patient's posture is erect.   Neurologic exam : The patient is awake and alert, oriented to place  and time.   Memory subjective described as intact.  Attention span & concentration ability appears normal.  Speech is fluent,  without  dysarthria, dysphonia or aphasia.  Mood and affect are appropriate.   Cranial nerves: no loss of smell or taste reported  Pupils are equal and briskly reactive to light. Funduscopic exam deferred.  Extraocular movements in vertical and horizontal planes were intact and without nystagmus. No Diplopia. Visual fields by finger perimetry are intact. Hearing was intact to tuning fork    Facial sensation intact to fine touch.  Facial motor strength is symmetric and tongue and uvula move midline.  Neck ROM : rotation, tilt and flexion extension were normal for age and shoulder shrug was symmetrical.    Motor exam:  Symmetric bulk, tone and ROM.   Normal tone without cog -wheeling, symmetric grip strength .   Sensory:  Fine touch, pinprick and vibration were tested  and reduced over the left foot.  Proprioception tested in the upper extremities was normal.   Coordination: Rapid alternating movements in the fingers/hands were of normal speed.    Gait and station: Patient could rise unassisted from a seated position, walked without assistive device.  Stance is of normal width/ base and the patient turned with 3 steps.  Toe and heel walk were deferred.  Deep tendon reflexes: in the  upper and lower extremities are symmetric and  intact.         After spending a total time of  35  minutes face to face and additional time for physical and neurologic examination, review of laboratory studies,  personal review of imaging studies, reports and results of other testing and review of referral information / records as far as provided in visit, I have established the following assessments:  Patient  feels he needs CPAP and wants to re test . HST re- ordered.    My Plan is to proceed with:  1) we will repeat a sleep study, HST , since the patient had a test on a  non-representative night- poor sleep, pain and slept in a chair.   2) I am happy to refill his modafinil for 90 days at a time with 3 refills. 3) we will meet again  in another 4 months depending on how long it would take to get the sleep study approved to see if he is even a candidate for inspire and if not what other alternative treatments may be options for him.  I would like to thank Marin Olp, MD and Marin Olp, [address] [place],  [place] [id number] for allowing me to meet with and to take care of this pleasant patient.   In short, CYPHER PAULE is presenting with OSA on CPAP and persistent hypersomnia. I plan to follow up either personally or through our NP within 2-4 months.   CC: I will share my notes with PCP.  Electronically signed by: Larey Seat, MD 06/02/2022 2:12 PM  [facility] and [facility] Board certified by The [employer] of Sleep Medicine and Diplomate of the [employer] of Sleep Medicine. Board certified In Neurology through the [place], Fellow of the [employer] of Neurology. Medical Director of [facility].

## 2022-06-02 NOTE — Patient Instructions (Signed)

## 2022-06-06 ENCOUNTER — Telehealth: Payer: Self-pay | Admitting: Neurology

## 2022-06-06 NOTE — Telephone Encounter (Signed)
HST- [employer] no auth req.  Patient is scheduled at [facility] 07/05/22 at 8 AM.  Mailed packet to the patient.

## 2022-07-05 ENCOUNTER — Ambulatory Visit: Payer: 59 | Admitting: Neurology

## 2022-07-05 DIAGNOSIS — G4733 Obstructive sleep apnea (adult) (pediatric): Secondary | ICD-10-CM | POA: Diagnosis not present

## 2022-07-05 DIAGNOSIS — G478 Other sleep disorders: Secondary | ICD-10-CM

## 2022-07-05 DIAGNOSIS — G4719 Other hypersomnia: Secondary | ICD-10-CM

## 2022-07-05 DIAGNOSIS — G4710 Hypersomnia, unspecified: Secondary | ICD-10-CM

## 2022-07-11 DIAGNOSIS — G4719 Other hypersomnia: Secondary | ICD-10-CM | POA: Insufficient documentation

## 2022-07-11 MED ORDER — ARMODAFINIL 150 MG PO TABS: 150.0000 mg | ORAL_TABLET | Freq: Every day | ORAL | 3 refills | Status: DC

## 2022-07-11 NOTE — Addendum Note (Signed)
Addended by: Larey Seat on: 07/11/2022 02:36 PM   Modules accepted: Orders

## 2022-07-11 NOTE — Progress Notes (Signed)
Upper airway resistance syndrome , mild , unspecified sleep apnea.  Continue CPAP use with autotitration device ( ResMed or Phillips)  at 5-12 cm water pressure settings, heated humidification and a FFM of choice.  Avoiding prone sleep. Weight loss is recommended, consider medical program ( weight and wellness) .

## 2022-07-11 NOTE — Procedures (Signed)
[facility] TEST REPORT ( by Watch PAT)   STUDY DATA available since 07-11-2022:   DOB:  1955/10/30    ORDERING CLINICIAN: Larey Seat, MD  REFERRING CLINICIAN: Garret Reddish , MD    CLINICAL INFORMATION/HISTORY: Vincent Wilkerson is a 66-year -old Caucasian male patient and was seen upon referral on 12/08/2021 from PCP for a second opinion on OSA care..  Chief concern according to patient :  " I am doing well on CPAP , except I can't nap- I choke still , I wake up, and its a hustle to travel with. I still need Modafinil and my doctor doesn't refill it , I have to call every month and there no refills. I though of Inspire as an alternative'.    I have the pleasure of seeing Vincent Wilkerson today, a right -handed male with a possible sleep disorder. He has a past medical history of Left toe osteo-Arthritis, Depression, Hiccups, Hyperlipidemia, Hypertension, infant -Pneumonia , Tinnitus, Covid in early 2020, Foot and ankle injuries and surgeries, and  OSA-Sleep apnea. Uses CPAP. The patient had the first sleep study in [place]  the year 2000  with a result of OSA- none since.      Epworth sleepiness score: 14 /24 when off Nuvigil . FSS at 37/63 points- before CPAP he would have endorsed 47 points.    BMI: 31 kg/m   Neck Circumference: 19"   FINDINGS:   Sleep Summary:   Total Recording Time (hours, min):    8 hours and 48 minutes   Total Sleep Time (hours, min): 7 hours and 15 minutes               Percent REM (%): 19%  Sleep latency was 19 minutes, REM sleep latency was 63 minutes.                                      Respiratory Indices:   Calculated pAHI (per hour):   2.3/h Calculated P RDI per hour : 14/h                          REM pAHI: 2.9/h.  RDI 7.4/h                                               NREM pAHI:   2.1/h.  RDI 15.6/h                           Supine AHI: 2.1/h, but RDI was 10.1/h.  The highest AHI was improving sleep at 7.5/h  and an RDI of 17.6/h.  Right lateral sleep was associated with an RDI of 20.4/h.                                                 Oxygen Saturation Statistics:    O2 Saturation Range (%): Between a nadir at 91 and a maximum at 99% saturation with a mean saturation at 95%  O2 Saturation (minutes) <89%:   0 minutes        Pulse Rate Statistics:   Pulse Mean (bpm): 63 bpm               Pulse Range: Between 46 and 105 bpm               IMPRESSION:  This HST confirms the presence of a sleep disordered breathing disorder related to upper airway resistance.  The AHI overall is too low to qualify for the diagnosis of sleep apnea, yet the RDI clearly indicates respiratory events leading to sleep fragmentation.   RECOMMENDATION: The patient has experienced the difference of using CPAP and not using CPAP and feels better and more rested after sleeping with CPAP.  He should be able to continue CPAP therapy based on an RDI value.  My working diagnosis will be sleep apnea, unspecified. I am also not opposed to continue using the generic form of Nuvigil for idiopathic hypersomnia. This patient should avoid prone sleep.    INTERPRETING PHYSICIAN:   Larey Seat, MD   [facility] and [facility] Sleep Board certified by The [employer] of Sleep Medicine and Diplomate of the [employer] of Sleep Medicine. Board certified In Neurology through the [place], Fellow of the [employer] of Neurology.

## 2022-07-11 NOTE — Progress Notes (Signed)
[facility] TEST REPORT ( by Watch PAT)   STUDY DATA available since 07-11-2022:   DOB:  06/09/1956    ORDERING CLINICIAN: Larey Seat, MD  REFERRING CLINICIAN: Garret Reddish , MD    CLINICAL INFORMATION/HISTORY: Vincent Wilkerson is a 66-year -old Caucasian male patient and was seen upon referral on 12/08/2021 from PCP for a second opinion on OSA care..  Chief concern according to patient :  " I am doing well on CPAP , except I can't nap- I choke still , I wake up, and its a hustle to travel with. I still need Modafinil and my doctor doesn't refill it , I have to call every month and there no refills. I though of Inspire as an alternative'.    I have the pleasure of seeing Vincent Wilkerson today, a right -handed male with a possible sleep disorder. He has a past medical history of Left toe osteo-Arthritis, Depression, Hiccups, Hyperlipidemia, Hypertension, infant -Pneumonia , Tinnitus, Covid in early 2020, Foot and ankle injuries and surgeries, and  OSA-Sleep apnea. Uses CPAP. The patient had the first sleep study in [place]  the year 2000  with a result of OSA- none since.      Epworth sleepiness score: 14 /24 when off Nuvigil . FSS at 37/63 points- before CPAP he would have endorsed 47 points.    BMI: 31 kg/m   Neck Circumference: 19"   FINDINGS:   Sleep Summary:   Total Recording Time (hours, min):    8 hours and 48 minutes   Total Sleep Time (hours, min): 7 hours and 15 minutes               Percent REM (%): 19%  Sleep latency was 19 minutes, REM sleep latency was 63 minutes.                                      Respiratory Indices:   Calculated pAHI (per hour):   2.3/h Calculated P RDI per hour : 14/h                          REM pAHI: 2.9/h.  RDI 7.4/h                                               NREM pAHI:   2.1/h.  RDI 15.6/h                           Supine AHI: 2.1/h, but RDI was 10.1/h.  The highest AHI was improving sleep at 7.5/h  and an RDI of 17.6/h.  Right lateral sleep was associated with an RDI of 20.4/h.                                                 Oxygen Saturation Statistics:    O2 Saturation Range (%): Between a nadir at 91 and a maximum at 99% saturation with a mean saturation at 95%  O2 Saturation (minutes) <89%:   0 minutes        Pulse Rate Statistics:   Pulse Mean (bpm): 63 bpm               Pulse Range: Between 46 and 105 bpm               IMPRESSION:  This HST confirms the presence of a sleep disordered breathing disorder related to upper airway resistance.  The AHI overall is too low to qualify for the diagnosis of sleep apnea, yet the RDI clearly indicates respiratory events leading to sleep fragmentation.   RECOMMENDATION: The patient has experienced the difference of using CPAP and not using CPAP and feels better and more rested after sleeping with CPAP.  He should be able to continue CPAP therapy based on an RDI value.  My working diagnosis will be sleep apnea, unspecified. I am also not opposed to continue using the generic form of Nuvigil for idiopathic hypersomnia. This patient should avoid prone sleep.    INTERPRETING PHYSICIAN:   Larey Seat, MD   [facility] and [facility] Sleep Board certified by The [employer] of Sleep Medicine and Diplomate of the [employer] of Sleep Medicine. Board certified In Neurology through the [place], Fellow of the [employer] of Neurology.

## 2022-07-12 ENCOUNTER — Telehealth: Payer: Self-pay

## 2022-07-12 NOTE — Telephone Encounter (Signed)
I called patient to discuss his sleep study results.  No answer, voicemail was full.  Will try again later.  If patient calls back please route to POD 1.

## 2022-07-12 NOTE — Telephone Encounter (Signed)
-----   Message from Larey Seat, MD sent at 07/11/2022  2:36 PM EST ----- Upper airway resistance syndrome , mild , unspecified sleep apnea.  Continue CPAP use with autotitration device ( ResMed or Phillips)  at 5-12 cm water pressure settings, heated humidification and a FFM of choice.  Avoiding prone sleep. Weight loss is recommended, consider medical program ( weight and wellness) .

## 2022-07-18 NOTE — Telephone Encounter (Signed)
Tried calling pt again, mailbox full, unable to leave message.

## 2022-07-20 NOTE — Telephone Encounter (Signed)
Called and spoke with pt about sleep study results. Order sent to [facility] to make adjustments to current machine. He declined referral to [facility] right now. He will think about this.   Scheduled f/u for 10/20/22 at 9:30am with Dr. Brett Fairy.

## 2022-09-15 ENCOUNTER — Ambulatory Visit (INDEPENDENT_AMBULATORY_CARE_PROVIDER_SITE_OTHER): Payer: 59 | Admitting: Family

## 2022-09-15 ENCOUNTER — Encounter: Payer: Self-pay | Admitting: Family

## 2022-09-15 VITALS — BP 161/83 | HR 105 | Temp 98.0°F | Ht 70.0 in | Wt 218.2 lb

## 2022-09-15 DIAGNOSIS — J069 Acute upper respiratory infection, unspecified: Secondary | ICD-10-CM | POA: Diagnosis not present

## 2022-09-15 NOTE — Progress Notes (Signed)
Patient ID: Vincent Wilkerson, male    DOB: Nov 15, 1955, 67 y.o.   MRN: [id number]  Chief Complaint  Patient presents with   Sinus Problem    Pt c/o Nasal congestion, sore throat, headaches and cough with green/yellow mucus. Present for a week and a half. Has tried Advil which did help headaches.     HPI:      URI sx:  Pt c/o Nasal congestion, sore throat, headaches and cough with green/yellow mucus. Present for a week and a half. Has tried Advil which did help headaches. Also has taken saline nasal.  Assessment & Plan:  1. Viral upper respiratory tract infection-  lungs clear; advised on using generic Sudafed, Claritin-D, or Mucinex per box directions, Ibuprofen up to '600mg'$  or generic Tylenol 1,'000mg'$  every 6 hours. Use nasal saline spray tid, humidifier overnight. Increase water intake to at least 2 liters qd.   Subjective:    Outpatient Medications Prior to Visit  Medication Sig Dispense Refill   Armodafinil 150 MG tablet Take 1 tablet (150 mg total) by mouth daily. 90 tablet 3   hydrochlorothiazide (MICROZIDE) 12.5 MG capsule TAKE 1 CAPSULE BY MOUTH  DAILY 90 capsule 3   Multiple Vitamin (MULTIVITAMIN) tablet Take 1 tablet by mouth daily. Nature Made for men-Take one daily     Multiple Vitamins-Minerals (AIRBORNE PO) Take by mouth daily.     rosuvastatin (CRESTOR) 20 MG tablet Take 1 tablet (20 mg total) by mouth daily. 90 tablet 3   No facility-administered medications prior to visit.   Past Medical History:  Diagnosis Date   Arthritis    Left Big Toe    Depression    Hiccups    in the past/  Took Haloperidol to help   Hyperlipidemia    Hypertension    Pneumonia    as a baby   Sleep apnea    Past Surgical History:  Procedure Laterality Date   COLONOSCOPY     MELANOMA EXCISION     Back   No Known Allergies    Objective:    Physical Exam Vitals and nursing note reviewed.  Constitutional:      General: He is not in acute distress.    Appearance: Normal appearance.   HENT:     Head: Normocephalic.     Right Ear: Tympanic membrane and ear canal normal.     Left Ear: Tympanic membrane and ear canal normal.     Nose:     Right Sinus: No maxillary sinus tenderness or frontal sinus tenderness.     Left Sinus: No maxillary sinus tenderness or frontal sinus tenderness.     Mouth/Throat:     Mouth: Mucous membranes are moist.     Pharynx: No pharyngeal swelling, oropharyngeal exudate, posterior oropharyngeal erythema or uvula swelling.     Tonsils: No tonsillar exudate or tonsillar abscesses.  Cardiovascular:     Rate and Rhythm: Normal rate and regular rhythm.  Pulmonary:     Effort: Pulmonary effort is normal.     Breath sounds: Normal breath sounds.  Musculoskeletal:        General: Normal range of motion.     Cervical back: Normal range of motion.  Lymphadenopathy:     Head:     Right side of head: No preauricular or posterior auricular adenopathy.     Left side of head: No preauricular or posterior auricular adenopathy.     Cervical: No cervical adenopathy.  Skin:    General: Skin is  warm and dry.  Neurological:     Mental Status: He is alert and oriented to person, place, and time.  Psychiatric:        Mood and Affect: Mood normal.   BP (!) 161/83 (BP Location: Left Arm, Patient Position: Sitting, Cuff Size: Large)   Pulse (!) 105   Temp 98 F (36.7 C) (Temporal)   Ht '5\' 10"'$  (1.778 m)   Wt 218 lb 4 oz (99 kg)   SpO2 99%   BMI 31.32 kg/m  Wt Readings from Last 3 Encounters:  09/15/22 218 lb 4 oz (99 kg)  06/02/22 220 lb (99.8 kg)  12/08/21 219 lb 8 oz (99.6 kg)       Jeanie Sewer, NP

## 2022-10-10 ENCOUNTER — Encounter: Payer: Self-pay | Admitting: Family Medicine

## 2022-10-10 ENCOUNTER — Ambulatory Visit (INDEPENDENT_AMBULATORY_CARE_PROVIDER_SITE_OTHER): Payer: 59 | Admitting: Family Medicine

## 2022-10-10 VITALS — BP 112/72 | HR 72 | Temp 97.4°F | Ht 70.0 in | Wt 215.4 lb

## 2022-10-10 DIAGNOSIS — E785 Hyperlipidemia, unspecified: Secondary | ICD-10-CM | POA: Diagnosis not present

## 2022-10-10 DIAGNOSIS — Z125 Encounter for screening for malignant neoplasm of prostate: Secondary | ICD-10-CM

## 2022-10-10 DIAGNOSIS — Z0000 Encounter for general adult medical examination without abnormal findings: Secondary | ICD-10-CM | POA: Diagnosis not present

## 2022-10-10 LAB — LIPID PANEL
Cholesterol: 144 mg/dL (ref 0–200)
HDL: 46.9 mg/dL (ref >39.00)
LDL Cholesterol: 82 mg/dL (ref 0–99)
NonHDL: 97.59
Total CHOL/HDL Ratio: 3
Triglycerides: 80 mg/dL (ref 0.0–149.0)
VLDL: 16 mg/dL (ref 0.0–40.0)

## 2022-10-10 LAB — CBC WITH DIFFERENTIAL/PLATELET
Basophils Absolute: 0 10*3/uL (ref 0.0–0.1)
Basophils Relative: 0.4 % (ref 0.0–3.0)
Eosinophils Absolute: 0.2 10*3/uL (ref 0.0–0.7)
Eosinophils Relative: 2.3 % (ref 0.0–5.0)
HCT: 48.2 % (ref 39.0–52.0)
Hemoglobin: 17 g/dL (ref 13.0–17.0)
Lymphocytes Relative: 33.6 % (ref 12.0–46.0)
Lymphs Abs: 3 10*3/uL (ref 0.7–4.0)
MCHC: 35.3 g/dL (ref 30.0–36.0)
MCV: 92 fl (ref 78.0–100.0)
Monocytes Absolute: 0.6 10*3/uL (ref 0.1–1.0)
Monocytes Relative: 6.5 % (ref 3.0–12.0)
Neutro Abs: 5 10*3/uL (ref 1.4–7.7)
Neutrophils Relative %: 57.2 % (ref 43.0–77.0)
Platelets: 256 10*3/uL (ref 150.0–400.0)
RBC: 5.24 Mil/uL (ref 4.22–5.81)
RDW: 13.8 % (ref 11.5–15.5)
WBC: 8.8 10*3/uL (ref 4.0–10.5)

## 2022-10-10 LAB — COMPREHENSIVE METABOLIC PANEL
ALT: 22 U/L (ref 0–53)
AST: 21 U/L (ref 0–37)
Albumin: 4.3 g/dL (ref 3.5–5.2)
Alkaline Phosphatase: 74 U/L (ref 39–117)
BUN: 23 mg/dL (ref 6–23)
CO2: 28 mEq/L (ref 19–32)
Calcium: 10 mg/dL (ref 8.4–10.5)
Chloride: 104 mEq/L (ref 96–112)
Creatinine, Ser: 0.88 mg/dL (ref 0.40–1.50)
GFR: 90.84 mL/min (ref >60.00)
Glucose, Bld: 87 mg/dL (ref 70–99)
Potassium: 4.3 mEq/L (ref 3.5–5.1)
Sodium: 142 mEq/L (ref 135–145)
Total Bilirubin: 0.6 mg/dL (ref 0.2–1.2)
Total Protein: 6.3 g/dL (ref 6.0–8.3)

## 2022-10-10 LAB — URINALYSIS, ROUTINE W REFLEX MICROSCOPIC
Bilirubin Urine: NEGATIVE
Hgb urine dipstick: NEGATIVE
Ketones, ur: NEGATIVE
Leukocytes,Ua: NEGATIVE
Nitrite: NEGATIVE
RBC / HPF: NONE SEEN (ref 0–?)
Specific Gravity, Urine: 1.02 (ref 1.000–1.030)
Total Protein, Urine: NEGATIVE
Urine Glucose: NEGATIVE
Urobilinogen, UA: 0.2 (ref 0.0–1.0)
pH: 7 (ref 5.0–8.0)

## 2022-10-10 LAB — PSA: PSA: 2.5 ng/mL (ref 0.10–4.00)

## 2022-10-10 NOTE — Progress Notes (Signed)
Phone: [phone]   Subjective:  Patient presents today for their annual physical. Chief complaint-noted.   See problem oriented charting- ROS- full  review of systems was completed and negative  except for: congestion, tinnitus but no change and not pulsatile  The following were reviewed and entered/updated in epic: Past Medical History:  Diagnosis Date   Arthritis    Left Big Toe    Depression    Hiccups    in the past/  Took Haloperidol to help   Hyperlipidemia    Hypertension    Pneumonia    as a baby   Sleep apnea    Patient Active Problem List   Diagnosis Date Noted   History of melanoma 12/15/2020    Priority: High   Sleep apnea with hypersomnolence 04/24/2019    Priority: High   OSA on CPAP 12/08/2021    Priority: Medium    Essential hypertension 08/31/2015    Priority: Medium    Hyperlipidemia 05/11/2007    Priority: Medium    OSA (obstructive sleep apnea) 05/11/2007    Priority: Medium    Excessive daytime sleepiness 07/11/2022    Priority: Low   UARS (upper airway resistance syndrome) 05/30/2022    Priority: Low   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    Priority: Low   Right calf pain 06/07/2018    Priority: Low   Obese 04/15/2015    Priority: Low   Right hip pain 01/19/2015    Priority: Low   History of basal cell cancer 05/10/2010    Priority: Low   Past Surgical History:  Procedure Laterality Date   CATARACT EXTRACTION Bilateral    COLONOSCOPY     MELANOMA EXCISION     Back    Family History  Problem Relation Age of Onset   Depression Mother    Alcohol abuse Mother    COPD Mother    Gallbladder disease Father    Heart disease Father        CHF- died from this   Polycythemia Father    Alcohol abuse Sister    Colon cancer Neg Hx    Rectal cancer Neg Hx     Medications- reviewed and updated Current Outpatient Medications  Medication Sig Dispense Refill   Armodafinil 150 MG tablet Take 1 tablet (150 mg total) by mouth  daily. 90 tablet 3   hydrochlorothiazide (MICROZIDE) 12.5 MG capsule TAKE 1 CAPSULE BY MOUTH  DAILY 90 capsule 3   Multiple Vitamin (MULTIVITAMIN) tablet Take 1 tablet by mouth daily. Nature Made for men-Take one daily     Multiple Vitamins-Minerals (AIRBORNE PO) Take by mouth daily.     rosuvastatin (CRESTOR) 20 MG tablet Take 1 tablet (20 mg total) by mouth daily. 90 tablet 3   No current facility-administered medications for this visit.    Allergies-reviewed and updated No Known Allergies  Social History   Social History Narrative   Right handed   3-4 cups caffeine per day (coffee)      Married 2001. 2 stepchildren. Both grown and married  In mid to late 20s. 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 Oropharynx normal Neck: no thyromegaly CV: RRR no murmurs rubs or gallops Lungs: CTAB no crackles, wheeze, rhonchi Abdomen: soft/nontender/nondistended/normal bowel sounds. No rebound or guarding.  Ext: no edema Skin: warm, dry Neuro: grossly normal, moves all extremities, PERRLA    Assessment and Plan  67 y.o. male presenting for annual physical.  Health Maintenance counseling: 1. Anticipatory guidance: Patient counseled regarding regular dental exams -q6 months, eye exams - yearly with Dr. Katy Fitch,  avoiding smoking and second hand smoke, limiting alcohol to 2 beverages per day- 2 a month, no illicit drugs .   2. Risk factor reduction:  Advised patient of need for regular exercise and diet rich and fruits and vegetables to reduce risk of heart attack and stroke.  Exercise- still getting a lot of miles in walking the dogs.  Diet/weight management-down  1 lb from last year- encouraged gradual weight loss- in general feels does pretty well but has some room for improvement.  Wt Readings from Last 3 Encounters:  10/10/22 215 lb 6.4 oz (97.7 kg)  09/15/22 218 lb 4 oz (99 kg)  06/02/22 220 lb (99.8 kg)  3. Immunizations/screenings/ancillary studies- holding off on covid shot, otherwise up to date  Immunization History  Administered Date(s) Administered   Influenza Split 04/26/2012, 06/07/2017   Influenza Whole 04/09/2010   Influenza,inj,Quad PF,6+ Mos 06/24/2014, 05/20/2019, 05/26/2022   Influenza-Unspecified 05/16/2015, 05/30/2018, 06/15/2020   PFIZER(Purple Top)SARS-COV-2 Vaccination 10/26/2019, 11/19/2019, 06/20/2020   Pfizer Covid-19 Vaccine Bivalent Booster 73yr & up 06/19/2021   Td 08/30/2005   Tdap 12/14/2015   Zoster Recombinat (Shingrix) 08/27/2018, 01/23/2019  4. Prostate cancer screening- low risk PSA trend-continue to monitor with labs today. Rectal low risk 2 years ago- some BPH. Last year urinary frequency improved with cutting down caffeine- still working for him- and cuts out water before bed Lab Results  Component Value Date   PSA 2.29 10/06/2021   PSA 2.33 10/01/2020   PSA 1.74 10/01/2019   5. Colon cancer screening - 04/29/19 with 10 year repeat planned 6. Skin cancer screening-due to history of melanoma every 3-6 months as well as basal cell carcinoma recommended regular follow-up-has seen Dr. HN[place]ada Cranemost recently. advised regular sunscreen use. Denies worrisome, changing, or new skin lesions- he will also mention to Dr. HN[place]ada Crane7. Smoking associated screening (lung cancer screening, AAA screen 65-75, UA)- never smoker 8. STD screening -only active with wife so not needed  Status of chronic or acute concerns   # illness early in February- still with some residual congestion. Not much sinus pressure. No cough.  Wants to monitor.   #hypertension S: medication: hctz 12.'5Mg'$ -Was on combination pill with lisinopril 10 mg in the  past- tried off due to tinnitus but did not help  BP Readings from Last 3 Encounters:  10/10/22 112/72  09/15/22 (!) 161/83  06/02/22 (!) 155/76  A/P: much improved without coffee in system- doing well- continue current medications   #hyperlipidemia-with peak LDL of 134 on file S: Medication: Rosuvastatin '20Mg'$  Daily Lab Results  Component Value Date   CHOL 166 10/06/2021   HDL 51.90 10/06/2021   LDLCALC 92 10/06/2021   LDLDIRECT 127.1 04/10/2012   TRIG 111.0 10/06/2021   CHOLHDL 3 10/06/2021   A/P: hopefully stable- update LDL today. Continue current meds for now  #Sleep apnea with hypersomnolence-patient on CPAP with variable pressure now and on armodafinil.  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  Bad experience with [facility] in the past. working with [facility] - works with Dr. Brett Fairy- recommended against inspire - dental appliance if ever goes off cpap  Recommended follow up: Return in about 1 year (around 10/11/2023) for physical or sooner if needed.Schedule b4 you leave. Future Appointments  Date Time Provider [place]  10/20/2022  9:30 AM Dohmeier, Asencion Partridge, MD [facility] None   Lab/Order associations: fasting   ICD-10-CM   1. Preventative health care  Z00.00     2. Hyperlipidemia, unspecified hyperlipidemia type  E78.5     3. Screening for prostate cancer  Z12.5       No orders of the defined types were placed in this encounter.   Return precautions advised.  Garret Reddish, MD

## 2022-10-10 NOTE — Patient Instructions (Addendum)
Please stop by lab before you go If you have mychart- we will send your results within 3 business days of [place] receiving them.  If you do not have mychart- we will call you about results within 5 business days of [place] receiving them.  *please also note that you will see labs on mychart as soon as they post. I will later go in and write notes on them- will say "notes from Dr. Yong Channel"   Recommended follow up: Return in about 1 year (around 10/11/2023) for physical or sooner if needed.Schedule b4 you leave.

## 2022-10-12 ENCOUNTER — Other Ambulatory Visit: Payer: Self-pay

## 2022-10-12 DIAGNOSIS — I10 Essential (primary) hypertension: Secondary | ICD-10-CM

## 2022-10-20 ENCOUNTER — Ambulatory Visit: Payer: 59 | Admitting: Neurology

## 2022-10-20 ENCOUNTER — Encounter: Payer: Self-pay | Admitting: Neurology

## 2022-10-20 VITALS — BP 131/84 | HR 72 | Ht 70.0 in | Wt 218.0 lb

## 2022-10-20 DIAGNOSIS — G4733 Obstructive sleep apnea (adult) (pediatric): Secondary | ICD-10-CM

## 2022-10-20 DIAGNOSIS — G4719 Other hypersomnia: Secondary | ICD-10-CM

## 2022-10-20 MED ORDER — ARMODAFINIL 200 MG PO TABS: 200.0000 mg | ORAL_TABLET | Freq: Every day | ORAL | 3 refills | Status: DC

## 2022-10-20 NOTE — Progress Notes (Signed)
SLEEP MEDICINE CLINIC     Provider:  Larey Seat, MD  Primary Care Physician:  Marin Olp, [place] [address]        Chief Complaint according to patient   Patient presents with:     New Patient (Initial Visit)     Patient in room #2 and alone. Pt states his sleeping habit has gotten better and he feels well.       HISTORY OF PRESENT ILLNESS:  Vincent Wilkerson is a 67 y.o. male patient who is here for revisit 10/20/2022 for UARS, Sleepiness and CPAP follow up. .   10-20-2022:   A recent HST had only shown an AHI of 4.5 /h but when he stopped CPAP he snored and had witnessed apnea ( per spouse)- UARS- . He is back on CPAP, feeling better. He sleeps usually 6 hours but  craves an hour more.  He goes to bed at 8-9 PM!. Vincent Wilkerson is residual apnea-hypopnea index on his current CPAP is 0.2/h which is ideal.  His 95th percentile pressure requirement is 11.2 cm water and he has minimal air leakage.  No central apneas are arising.  The machine was used 100% of the last 30 days and each of those days over 4 hours.  Average user time is 6 hours 37 minutes.  This is an AirSense 10 AutoSet by [employer] with a setting between 5 cm and 12 cm water and no EPR.  The serial number is [id number].  He uses Armodafinil for residual Sleepiness, 150  mg and would like the dose increased. He takes it at 6.30 and it wears off at 2.30 PM. He likes to use 200 mg tabs and break them in half, for a dose at 6.30 - and another at 9.30 AM and then would expect an affect into the late afternoon.    Social history : wife had recently total knee replacement.             HISTORY OF PRESENT ILLNESS:  Vincent Wilkerson is a 67 y.o. Caucasian male patient seen here as a referral on 06/02/2022-  He was doing very well with CPAP - was 100% compliant user with an average use at time of 6 hours 14 minutes at 9 cm set pressure set up date was November 15, 2018 yet he was set up with an  AirSense 10 at the time the mean residual apnea is only 0.1/h no central apneas arising and I think this is an excellent resolution there is very little air leakage.  Current Epworth sleepiness score is lower than it was at the time prior to CPAP use but he has taken Nuvigil to achieve this reduction.     His HST in 10- 11-2021 had only documented an AHI of 4.6/h and we allowed the patient to d/c CPAP but he is excessively sleepy and fatigued again and now wonders if the night was representative. He also was achy sore and sweaty- he couldn't sleep well, called it "a horrible night. ". Without the diagnoses of OSA he also ain't get Nuvigil.    We will repeat the HST. BMI is 31.6. BP 155/ 76          I originally saw him on 12-08-2021: from PCP for a second opinion on OSA care..  Chief concern according to patient :  " I am doing well on CPAP , except I can't nap- I  choke still , I wake up, and its a hustle to travel with. I still need Modafinil and my doctor doesn't refill it , I have to call every month and there no refills. " I though of Inspire as an alternative.    I have the pleasure of seeing Vincent Wilkerson today, a right -handed White or Caucasian male with a possible sleep disorder. He  has a past medical history of Left toe osteo-Arthritis, Depression, Hiccups, Hyperlipidemia, Hypertension, infant -Pneumonia , Tinnitus, Covid in early 2020, Foot and ankle injuries and surgeries, and  OSA-Sleep apnea.   The patient had the first sleep study in [place]  the year 2000  with a result of OSA- none since.    Sleep relevant medical history: Persistent hypersomnia. No Tonsillectomy, Football Concussion-    Family medical /sleep history: father , snorer and most likely- OSA, died of CHF , heavy smoker.    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  2 grown children, no grandchildren.  The patient currently works days but  used to work in shifts( Presenter, broadcasting,) Tobacco use- none .  ETOH use ; pone a month,  Caffeine intake in form of Coffee( 2-3 a day . Regular exercise in form of walking the dogs, 5 miles a day.          Review of Systems: Out of a complete 14 system review, the patient complains of only the following symptoms, and all other reviewed systems are negative.:  Fatigue, sleepiness , snoring, less fragmented sleep,     How likely are you to doze in the following situations: 0 = not likely, 1 = slight chance, 2 = moderate chance, 3 = high chance   Sitting and Reading? Watching Television? Sitting inactive in a public place (theater or meeting)? As a passenger in a car for an hour without a break? Lying down in the afternoon when circumstances permit? Sitting and talking to someone? Sitting quietly after lunch without alcohol? In a car, while stopped for a few minutes in traffic?   Total =  2/ 24 points   FSS endorsed at 16/ 63 points.   GDS 2/ 15 points.   Social History   Socioeconomic History   Marital status: Married    Spouse name: Not on file   Number of children: Not on file   Years of education: Not on file   Highest education level: Not on file  Occupational History   Not on file  Tobacco Use   Smoking status: Never   Smokeless tobacco: Never  Substance and Sexual Activity   Alcohol use: Yes    Alcohol/week: 0.0 standard drinks of alcohol    Comment: rare   Drug use: No   Sexual activity: Not on file  Other Topics Concern   Not on file  Social History Narrative   Right handed   3-4 cups caffeine per day (coffee)      Married 2001. 2 stepchildren. Both grown and married  In mid to late 20s. 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- wants to get shop up and running in 2024   Social Determinants of Health   Financial Resource Strain: Not on file  Food  Insecurity: Not on file  Transportation Needs:  Not on file  Physical Activity: Not on file  Stress: Not on file  Social Connections: Not on file    Family History  Problem Relation Age of Onset   Depression Mother    Alcohol abuse Mother    COPD Mother    Gallbladder disease Father    Heart disease Father        CHF- died from this   Polycythemia Father    Alcohol abuse Sister    Colon cancer Neg Hx    Rectal cancer Neg Hx     Past Medical History:  Diagnosis Date   Arthritis    Left Big Toe    Depression    Hiccups    in the past/  Took Haloperidol to help   Hyperlipidemia    Hypertension    Pneumonia    as a baby   Sleep apnea     Past Surgical History:  Procedure Laterality Date   CATARACT EXTRACTION Bilateral    COLONOSCOPY     MELANOMA EXCISION     Back     Current Outpatient Medications on File Prior to Visit  Medication Sig Dispense Refill   Armodafinil 150 MG tablet Take 1 tablet (150 mg total) by mouth daily. 90 tablet 3   hydrochlorothiazide (MICROZIDE) 12.5 MG capsule TAKE 1 CAPSULE BY MOUTH  DAILY 90 capsule 3   Multiple Vitamin (MULTIVITAMIN) tablet Take 1 tablet by mouth daily. Nature Made for men-Take one daily     Multiple Vitamins-Minerals (AIRBORNE PO) Take by mouth daily.     rosuvastatin (CRESTOR) 20 MG tablet Take 1 tablet (20 mg total) by mouth daily. 90 tablet 3   No current facility-administered medications on file prior to visit.    No Known Allergies   DIAGNOSTIC DATA (LABS, IMAGING, TESTING) - I reviewed patient records, labs, notes, testing and imaging myself where available.  Lab Results  Component Value Date   WBC 8.8 10/10/2022   HGB 17.0 10/10/2022   HCT 48.2 10/10/2022   MCV 92.0 10/10/2022   PLT 256.0 10/10/2022      Component Value Date/Time   NA 142 10/10/2022 0828   K 4.3 10/10/2022 0828   CL 104 10/10/2022 0828   CO2 28 10/10/2022 0828   GLUCOSE 87 10/10/2022 0828   BUN 23 10/10/2022 0828   CREATININE  0.88 10/10/2022 0828   CALCIUM 10.0 10/10/2022 0828   PROT 6.3 10/10/2022 0828   ALBUMIN 4.3 10/10/2022 0828   AST 21 10/10/2022 0828   ALT 22 10/10/2022 0828   ALKPHOS 74 10/10/2022 0828   BILITOT 0.6 10/10/2022 0828   GFRNONAA 84.37 03/25/2010 0844   GFRAA 92 12/06/2007 0945   Lab Results  Component Value Date   CHOL 144 10/10/2022   HDL 46.90 10/10/2022   LDLCALC 82 10/10/2022   LDLDIRECT 127.1 04/10/2012   TRIG 80.0 10/10/2022   CHOLHDL 3 10/10/2022   No results found for: "HGBA1C" No results found for: "VITAMINB12" Lab Results  Component Value Date   TSH 1.23 02/29/2016    PHYSICAL EXAM:  Today's Vitals   10/20/22 0921  BP: 131/84  Pulse: 72  Weight: 218 lb (98.9 kg)  Height: '5\' 10"'$  (1.778 m)   Body mass index is 31.28 kg/m.   Wt Readings from Last 3 Encounters:  10/20/22 218 lb (98.9 kg)  10/10/22 215 lb 6.4 oz (97.7 kg)  09/15/22 218 lb 4 oz (99 kg)     Ht Readings from Last 3 Encounters:  10/20/22 '5\' 10"'$  (1.778 m)  10/10/22 '5\' 10"'$  (1.778 m)  09/15/22 '5\' 10"'$  (1.778 m)      General: The patient is awake, alert and appears not in acute distress. The patient is well groomed. Head: Normocephalic, atraumatic. Neck is supple.  Mallampati 2, lateral narrowing ,  neck circumference:19 inches .  Nasal airflow  patent.  Retrognathia is  not seen.  Dental status: biological  Cardiovascular:  Regular rate and cardiac rhythm by pulse,  without distended neck veins. Respiratory: Lungs are clear to auscultation.  Skin:  Without evidence of ankle edema, or rash. Trunk: The patient's posture is erect.   Neurologic exam : The patient is awake and alert, oriented to place and time.   Memory subjective described as intact.  Attention span & concentration ability appears normal.  Speech is fluent,  without  dysarthria, dysphonia or aphasia.  Mood and affect are appropriate.   Cranial nerves: no loss of smell or taste reported  Pupils are equal and briskly  reactive to light. Funduscopic exam deferred.  Extraocular movements in vertical and horizontal planes were intact and without nystagmus. No Diplopia. Visual fields by finger perimetry are intact. Hearing was intact to tuning fork    Facial sensation intact to fine touch.  Facial motor strength is symmetric and tongue and uvula move midline.  Neck ROM : rotation, tilt and flexion extension were normal for age and shoulder shrug was symmetrical.    Motor exam:  Symmetric bulk, tone and ROM.   Normal tone without cog -wheeling, symmetric grip strength .   Sensory:  Fine touch, pinprick and vibration were tested  and reduced over the left foot.  Proprioception tested in the upper extremities was normal.   Coordination: Rapid alternating movements in the fingers/hands were of normal speed.    Gait and station: Patient could rise unassisted from a seated position, walked without assistive device.  Stance is of normal width/ base and the patient turned with 3 steps.  Toe and heel walk were deferred.  Deep tendon reflexes: in the  upper and lower extremities are symmetric and intact.          After spending a total time of  35  minutes face to face and additional time for physical and neurologic examination, review of laboratory studies,  personal review of imaging studies, reports and results of other testing and review of referral information / records as far as provided in visit, I have established the following assessments:  ASSESSMENT AND PLAN: 67 y.o. year old male  here with:    1) CPAP and Nuvigil at 150 mg have helped to reduce excessive sleepiness and fatigue. No changes in settings needed , but I will increase Nuvigil to 200 mg and start the split regimen. .       I plan to follow up either personally or through our NP within 12 months.   I would like to thank Marin Olp, MD and Marin Olp, [place] [place],  [place] [address] for allowing me to meet  with and to take care of this pleasant patient.   CC: I will share my notes with PCP .  After spending a total time of  28  minutes face to face and additional time for physical and neurologic examination, review of laboratory studies,  personal review of imaging studies, reports and results of other testing and review of referral information / records as far as provided in visit,  Electronically signed by: Larey Seat, MD 10/20/2022 9:54 AM  [facility] Board certified by The [employer] of Sleep Medicine and Diplomate of the [employer] of Sleep Medicine. Board certified In Neurology through the [place], Fellow of the [employer] of Neurology. Medical Director of [facility].

## 2022-10-20 NOTE — Patient Instructions (Signed)
Armodafinil Tablets What is this medication? ARMODAFINIL (ar moe DAF i nil) treats sleep disorders, such as narcolepsy, obstructive sleep apnea, and shift work disorder. It works by promoting wakefulness. It belongs to a group of medications called stimulants. This medicine may be used for other purposes; ask your health care provider or pharmacist if you have questions. COMMON BRAND NAME(S): Nuvigil What should I tell my care team before I take this medication? They need to know if you have any of these conditions: Depression Heart disease High blood pressure Kidney disease Liver disease Schizophrenia Substance use disorder Suicidal thoughts, plans, or attempt by you or a family member An unusual or allergic reaction to armodafinil, other medications, foods, dyes, or preservatives Pregnant or trying to get pregnant Breast-feeding How should I use this medication? Take this medication by mouth with water. Take it as directed on the prescription label at the same time every day. Keep taking this medication unless your care team tells you to stop. Stopping it too quickly can cause serious side effects. It can also make your condition worse. A special MedGuide will be given to you by the pharmacist with each prescription and refill. Be sure to read this information carefully each time. Talk to your care team about the use of this medication in children. While this medication may be prescribed for children as young as 17 years for selected conditions, precautions do apply. Overdosage: If you think you have taken too much of this medicine contact a poison control center or emergency room at once. NOTE: This medicine is only for you. Do not share this medicine with others. What if I miss a dose? If you miss a dose, take it as soon as you can. If it is almost time for your next dose, take only that dose. Do not take double or extra doses. What may interact with this medication? Do not take this  medication with any of the following: Amphetamine or dextroamphetamine Dexmethylphenidate or methylphenidate MAOIs, such as Marplan, Nardil, and Parnate Pemoline Procarbazine This medication may also interact with the following: Antifungal medications, such as itraconazole or ketoconazole Barbiturates, such as phenobarbital Carbamazepine Cyclosporine Diazepam Estrogen or progestin hormones Medications for mental health conditions Phenytoin Propranolol Triazolam Warfarin This list may not describe all possible interactions. Give your health care provider a list of all the medicines, herbs, non-prescription drugs, or dietary supplements you use. Also tell them if you smoke, drink alcohol, or use illegal drugs. Some items may interact with your medicine. What should I watch for while using this medication? Visit your care team for regular checks on your progress. It may be some time before you see the benefit from this medication. This medication may affect your coordination, reaction time, or judgment. It may also hide signs that you are tired. This medication will not eliminate your abnormal tendency to fall asleep and is not a replacement for sleep. Do not drive or operate machinery until you know how this medication affects you. Sit up or stand slowly to reduce the risk of dizzy or fainting spells. Drinking alcohol with this medication can increase the risk of these side effects. This medication may cause serious skin reactions. They can happen weeks to months after starting the medication. Contact your care team right away if you notice fevers or flu-like symptoms with a rash. The rash may be red or purple and then turn into blisters or peeling of the skin. You may also notice a red rash with swelling of the  face, lips, or lymph nodes in your neck or under your arms. Estrogen or progestin hormones may not work as well while you are taking this medication and for 1 month after stopping  treatment. If you are using these hormones for contraception, talk to your care team about using a second type of contraception. A barrier contraceptive, such as a condom or diaphragm, is recommended. It is unknown if the effects of this medication will be increased by the use of caffeine. Caffeine is found in many foods, beverages, and medications. Ask your care team if you should limit or change your intake of caffeine-containing products while on this medication. Do not stop previously prescribed treatments for your condition, such as a CPAP machine, except on the advice of your care team. What side effects may I notice from receiving this medication? Side effects that you should report to your care team as soon as possible: Allergic reactions or angioedema--skin rash, itching or hives, swelling of the face, eyes, lips, tongue, arms, or legs, trouble swallowing or breathing Increase in blood pressure Mood and behavior changes--anxiety, nervousness, confusion, hallucinations, irritability, hostility, thoughts of suicide or self-harm, worsening mood, feelings of depression Rash, fever, and swollen lymph nodes Redness, blistering, peeling, or loosening of the skin, including inside the mouth Side effects that usually do not require medical attention (report to your care team if they continue or are bothersome): Dizziness Headache Nausea Trouble sleeping This list may not describe all possible side effects. Call your doctor for medical advice about side effects. You may report side effects to FDA at 1-800-FDA-1088. Where should I keep my medication? Keep out of the reach of children and pets. This medication can be abused. Keep it in a safe place to protect it from theft. Do not share it with anyone. It is only for you. Selling or giving away this medication is dangerous and against the law. Store at room temperature between 20 and 25 degrees C (68 and 77 degrees F). Get rid of any unused medication  after the expiration date. This medication may cause harm and death if it is taken by other adults, children, or pets. It is important to get rid of the medication as soon as you no longer need it or it is expired. You can do this in two ways: Take the medication to a medication take-back program. Check with your pharmacy or law enforcement to find a location. If you cannot return the medication, check the label or package insert to see if the medication should be thrown out in the garbage or flushed down the toilet. If you are not sure, ask your care team. If it is safe to put it in the trash, take the medication out of the container. Mix the medication with cat litter, dirt, coffee grounds, or other unwanted substance. Seal the mixture in a bag or container. Put it in the trash. NOTE: This sheet is a summary. It may not cover all possible information. If you have questions about this medicine, talk to your doctor, pharmacist, or health care provider.  2023 Elsevier/Gold Standard (2021-09-09 00:00:00)

## 2022-11-28 ENCOUNTER — Other Ambulatory Visit: Payer: Self-pay | Admitting: Family Medicine

## 2022-11-28 DIAGNOSIS — I10 Essential (primary) hypertension: Secondary | ICD-10-CM

## 2022-11-30 ENCOUNTER — Telehealth: Payer: Self-pay | Admitting: Primary Care

## 2022-11-30 NOTE — Telephone Encounter (Signed)
Working recalls 4.17 spoke to pt. said he didn't want to return to the practice [facility]

## 2022-11-30 NOTE — Telephone Encounter (Signed)
That's fine, he is seeing [facility] for OSA management. Thanks

## 2023-03-13 NOTE — Progress Notes (Signed)
erroneous

## 2023-06-26 ENCOUNTER — Other Ambulatory Visit: Payer: Self-pay | Admitting: Neurology

## 2023-06-26 NOTE — Telephone Encounter (Signed)
Pt last seen 10/15/22, last dispensed 03/10/23 for 90 days supply. Has upcoming appt scheduled 10/19/23. Refill is appropriate.

## 2023-06-26 NOTE — Telephone Encounter (Signed)
Pt is requesting a refill for  Armodafinil 200 MG TABS.  Pharmacy: [facility] DELIVERY

## 2023-06-27 MED ORDER — ARMODAFINIL 200 MG PO TABS: 200.0000 mg | ORAL_TABLET | Freq: Every day | ORAL | 1 refills | Status: DC

## 2023-10-12 ENCOUNTER — Encounter: Payer: Self-pay | Admitting: Family Medicine

## 2023-10-12 ENCOUNTER — Ambulatory Visit (INDEPENDENT_AMBULATORY_CARE_PROVIDER_SITE_OTHER): Payer: 59 | Admitting: Family Medicine

## 2023-10-12 VITALS — BP 132/80 | HR 71 | Temp 97.9°F | Ht 70.0 in | Wt 217.6 lb

## 2023-10-12 DIAGNOSIS — M545 Low back pain, unspecified: Secondary | ICD-10-CM

## 2023-10-12 DIAGNOSIS — E785 Hyperlipidemia, unspecified: Secondary | ICD-10-CM

## 2023-10-12 DIAGNOSIS — I10 Essential (primary) hypertension: Secondary | ICD-10-CM | POA: Diagnosis not present

## 2023-10-12 DIAGNOSIS — Z131 Encounter for screening for diabetes mellitus: Secondary | ICD-10-CM | POA: Diagnosis not present

## 2023-10-12 DIAGNOSIS — Z0000 Encounter for general adult medical examination without abnormal findings: Secondary | ICD-10-CM

## 2023-10-12 DIAGNOSIS — E669 Obesity, unspecified: Secondary | ICD-10-CM | POA: Diagnosis not present

## 2023-10-12 DIAGNOSIS — Z23 Encounter for immunization: Secondary | ICD-10-CM | POA: Diagnosis not present

## 2023-10-12 DIAGNOSIS — Z125 Encounter for screening for malignant neoplasm of prostate: Secondary | ICD-10-CM | POA: Diagnosis not present

## 2023-10-12 DIAGNOSIS — M25512 Pain in left shoulder: Secondary | ICD-10-CM

## 2023-10-12 DIAGNOSIS — G8929 Other chronic pain: Secondary | ICD-10-CM

## 2023-10-12 LAB — COMPREHENSIVE METABOLIC PANEL
ALT: 23 U/L (ref 0–53)
AST: 19 U/L (ref 0–37)
Albumin: 4.5 g/dL (ref 3.5–5.2)
Alkaline Phosphatase: 71 U/L (ref 39–117)
BUN: 19 mg/dL (ref 6–23)
CO2: 31 meq/L (ref 19–32)
Calcium: 9.4 mg/dL (ref 8.4–10.5)
Chloride: 104 meq/L (ref 96–112)
Creatinine, Ser: 1.01 mg/dL (ref 0.40–1.50)
GFR: 78.01 mL/min (ref >60.00)
Glucose, Bld: 91 mg/dL (ref 70–99)
Potassium: 4.4 meq/L (ref 3.5–5.1)
Sodium: 141 meq/L (ref 135–145)
Total Bilirubin: 0.6 mg/dL (ref 0.2–1.2)
Total Protein: 6.3 g/dL (ref 6.0–8.3)

## 2023-10-12 LAB — PSA: PSA: 2.48 ng/mL (ref 0.10–4.00)

## 2023-10-12 LAB — HEMOGLOBIN A1C: Hgb A1c MFr Bld: 5.2 % (ref 4.6–6.5)

## 2023-10-12 LAB — CBC WITH DIFFERENTIAL/PLATELET
Basophils Absolute: 0 10*3/uL (ref 0.0–0.1)
Basophils Relative: 0.2 % (ref 0.0–3.0)
Eosinophils Absolute: 0.2 10*3/uL (ref 0.0–0.7)
Eosinophils Relative: 1.6 % (ref 0.0–5.0)
HCT: 48.4 % (ref 39.0–52.0)
Hemoglobin: 16.3 g/dL (ref 13.0–17.0)
Lymphocytes Relative: 47.1 % — ABNORMAL HIGH (ref 12.0–46.0)
Lymphs Abs: 6.6 10*3/uL — ABNORMAL HIGH (ref 0.7–4.0)
MCHC: 33.7 g/dL (ref 30.0–36.0)
MCV: 94.2 fl (ref 78.0–100.0)
Monocytes Absolute: 0.7 10*3/uL (ref 0.1–1.0)
Monocytes Relative: 5.2 % (ref 3.0–12.0)
Neutro Abs: 6.4 10*3/uL (ref 1.4–7.7)
Neutrophils Relative %: 45.9 % (ref 43.0–77.0)
Platelets: 259 10*3/uL (ref 150.0–400.0)
RBC: 5.13 Mil/uL (ref 4.22–5.81)
RDW: 14.2 % (ref 11.5–15.5)
WBC: 14 10*3/uL — ABNORMAL HIGH (ref 4.0–10.5)

## 2023-10-12 LAB — LIPID PANEL
Cholesterol: 178 mg/dL (ref 0–200)
HDL: 51 mg/dL (ref >39.00)
LDL Cholesterol: 107 mg/dL — ABNORMAL HIGH (ref 0–99)
NonHDL: 127.24
Total CHOL/HDL Ratio: 3
Triglycerides: 101 mg/dL (ref 0.0–149.0)
VLDL: 20.2 mg/dL (ref 0.0–40.0)

## 2023-10-12 NOTE — Progress Notes (Signed)
 Phone: [phone]   Subjective:  Patient presents today for their annual physical. Chief complaint-noted.   See problem oriented charting- ROS- full  review of systems was completed and negative  except for: Ear drainage, shoulder pain on the left, back pain  The following were reviewed and entered/updated in epic: Past Medical History:  Diagnosis Date   Arthritis    Left Big Toe    Depression    Hiccups    in the past/  Took Haloperidol to help   Hyperlipidemia    Hypertension    Pneumonia    as a baby   Sleep apnea    Patient Active Problem List   Diagnosis Date Noted   History of melanoma 12/15/2020    Priority: High   Sleep apnea with hypersomnolence 04/24/2019    Priority: High   OSA on CPAP 12/08/2021    Priority: Medium    Essential hypertension 08/31/2015    Priority: Medium    Hyperlipidemia 05/11/2007    Priority: Medium    OSA (obstructive sleep apnea) 05/11/2007    Priority: Medium    Excessive daytime sleepiness 07/11/2022    Priority: Low   UARS (upper airway resistance syndrome) 05/30/2022    Priority: Low   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    Priority: Low   Right calf pain 06/07/2018    Priority: Low   Obese 04/15/2015    Priority: Low   Right hip pain 01/19/2015    Priority: Low   History of basal cell cancer 05/10/2010    Priority: Low   Past Surgical History:  Procedure Laterality Date   CATARACT EXTRACTION Bilateral    COLONOSCOPY     MELANOMA EXCISION     Back    Family History  Problem Relation Age of Onset   Depression Mother    Alcohol abuse Mother    COPD Mother    Gallbladder disease Father    Heart disease Father        CHF- died from this   Polycythemia Father    Alcohol abuse Sister    Colon cancer Neg Hx    Rectal cancer Neg Hx     Medications- reviewed and updated Current Outpatient Medications  Medication Sig Dispense Refill   Armodafinil 200 MG TABS Take 1 tablet (200 mg total) by mouth  daily. 90 tablet 1   hydrochlorothiazide (MICROZIDE) 12.5 MG capsule TAKE 1 CAPSULE BY MOUTH DAILY 90 capsule 3   Multiple Vitamin (MULTIVITAMIN) tablet Take 1 tablet by mouth daily. Nature Made for men-Take one daily     Multiple Vitamins-Minerals (AIRBORNE PO) Take by mouth daily.     rosuvastatin (CRESTOR) 20 MG tablet TAKE 1 TABLET BY MOUTH DAILY 90 tablet 3   No current facility-administered medications for this visit.    Allergies-reviewed and updated No Known Allergies  Social History   Social History Narrative   Right handed   3-4 cups caffeine per day (coffee)      Married 2001. 2 stepchildren. Both grown and married  In mid to late 20s. 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 Oropharynx normal Neck: no thyromegaly CV: RRR no murmurs rubs or gallops Lungs: CTAB no crackles, wheeze, rhonchi Abdomen: soft/nontender/nondistended/normal bowel sounds. No rebound or guarding.  Ext: no edema Skin: warm, dry Neuro: grossly normal, moves all extremities, PERRLA Declines genitourinary and rectal   Assessment and Plan  68 y.o. male presenting for annual physical.  Health Maintenance counseling: 1. Anticipatory guidance: Patient counseled regarding regular dental exams -q6 months, eye exams -yearly with Dr. Dione Booze,  avoiding smoking and second hand smoke, limiting alcohol to 2 beverages per day-beer every 2 months, no illicit drugs .   2. Risk factor reduction:  Advised patient of need for regular exercise and diet rich and fruits and vegetables to reduce risk of heart attack and stroke.  Exercise-still walking his  dogs regularly-step back with this after losing his 2 dogs in the fall- planning to look this weekend- rescue dog Diet/weight management-weight up 2 pounds in the last year. Feels eating reasonably healthy overall. Trying to get vegetables, fiber, protein in reasonable quantities.  Wt Readings from Last 3 Encounters:  10/12/23 217 lb 9.6 oz (98.7 kg)  10/20/22 218 lb (98.9 kg)  10/10/22 215 lb 6.4 oz (97.7 kg)  3. Immunizations/screenings/ancillary studies-Prevnar 20 today, declines COVID  Immunization History  Administered Date(s) Administered   Influenza Split 04/26/2012, 06/07/2017   Influenza Whole 04/09/2010   Influenza,inj,Quad PF,6+ Mos 06/24/2014, 05/20/2019, 05/26/2022   Influenza-Unspecified 05/16/2015, 05/30/2018, 06/15/2020, 05/16/2023   PFIZER(Purple Top)SARS-COV-2 Vaccination 10/26/2019, 11/19/2019, 06/20/2020   Pfizer Covid-19 Vaccine Bivalent Booster 51yrs & up 06/19/2021   Td 08/30/2005   Tdap 12/14/2015   Zoster Recombinant(Shingrix) 08/27/2018, 01/23/2019  4. Prostate cancer screening-  low risk prior trend- update psa today. Limiting caffeine helps urinary symptoms as well as cutting out water before bed- mildly worsened frequency - wants to restart his exercise and see Lab Results  Component Value Date   PSA 2.50 10/10/2022   PSA 2.29 10/06/2021   PSA 2.33 10/01/2020   5. Colon cancer screening - 04/29/2019 with 10-year repeat planned 6. Skin cancer screening-history of melanoma and basal cell so is seen at least every 3-6 months with Dr. Margo Aye. advised regular sunscreen use. Denies worrisome, changing, or new skin lesions.  7. Smoking associated screening (lung cancer screening, AAA screen 65-75, UA)-never smoker 8. STD screening -only active with wife  Status of chronic or acute concerns   #social update- lost dogs in September -still working  #Cerumen impactions intermittently-no recent debrox - very dry in right ear- he may try -does use qtips at times-  advised against -Ear drainage-started after having flulike illness 4 weeks ago- no active infection noted  - had some headache(s) with this. No fever.  - had pain once a few weeks ago but no recent issues with the ears  # Back pain-6 months of issues only with certain movements- lifting leg and bending forward very bothersome (stays in low back).  Heat is helpful.  Uses an incline ramp for stretching and helps.  - he agrees to trial of physical therapy and if not improving will reach out and can either do x-rays or sports medicine consult  # Left shoulder pain-been going on for several years but worse in the last 6 months -not able to sleep on that side for years but now certain movements bother it.   - same plan as above for back  pain  #hypertension S: medication: hctz 12.5Mg -Was on combination pill with lisinopril 10 mg in the past- tried off due to tinnitus but did not help  Home readings #s: doesn't check much BP Readings from Last 3 Encounters:  10/12/23 132/80  10/20/22 131/84  10/10/22 112/72  A/P: well controlled continue current medications   #hyperlipidemia-with peak LDL of 134 on file S: Medication: Rosuvastatin 20Mg  Daily Lab Results  Component Value Date   CHOL 144 10/10/2022   HDL 46.90 10/10/2022   LDLCALC 82 10/10/2022   LDLDIRECT 127.1 04/10/2012   TRIG 80.0 10/10/2022   CHOLHDL 3 10/10/2022   A/P: as long as stable we opted to continue current medications - especially as can increase prediabetes risk   #Sleep apnea with hypersomnolence-patient on CPAP with variable pressure now and on armodafinil.  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  working with [employer].   Recommended follow up: Return in about 1 year (around 10/11/2024) for physical or sooner if needed.Schedule b4 you leave. Future Appointments  Date Time Provider Department Center  10/19/2023 10:30 AM Dohmeier, Porfirio Mylar, MD [facility] None   Lab/Order associations: fasting   ICD-10-CM   1. Preventative  health care  Z00.00     2. Essential hypertension  I10     3. Hyperlipidemia, unspecified hyperlipidemia type  E78.5     4. Screening for prostate cancer  Z12.5     5. Screening for diabetes mellitus  Z13.1     6. Obesity (BMI 30-39.9)  E66.9     7. Chronic bilateral low back pain without sciatica  M54.50    G89.29     8. Chronic left shoulder pain  M25.512    G89.29      No orders of the defined types were placed in this encounter.  Return precautions advised.  Tana Conch, MD

## 2023-10-12 NOTE — Addendum Note (Signed)
 Addended by: Donzetta Starch on: 10/12/2023 09:00 AM   Modules accepted: Orders

## 2023-10-12 NOTE — Patient Instructions (Addendum)
 Health Maintenance Due  Topic Date Due   Pneumonia Vaccine 55+ Years old (1 of 1 - PCV) Never done  Prevnar 20 today  - he agrees to trial of physical therapy for back and left shoulder and if not improving will reach out and can either do x-rays or sports medicine consult -schedule physical therapy at the desk  I suggest myfitnesspal Use 0.5 pounds per week weight loss goal (or up to 1 pounds if needed) Set a reasonable goal such as 5-10 lbs and can reset goal once you reach the goal Do not connect your step counter to this- watch or phone Update me in 2-3 months with how you are doing  Schedule follow up with Dr. Marcelo Baldy office  Please stop by lab before you go If you have mychart- we will send your results within 3 business days of [place] receiving them.  If you do not have mychart- we will call you about results within 5 business days of [place] receiving them.  *please also note that you will see labs on mychart as soon as they post. I will later go in and write notes on them- will say "notes from Dr. Durene Cal"   Recommended follow up: Return in about 1 year (around 10/11/2024) for physical or sooner if needed.Schedule b4 you leave.

## 2023-10-16 NOTE — Telephone Encounter (Signed)
 FYI

## 2023-10-17 ENCOUNTER — Other Ambulatory Visit: Payer: Self-pay

## 2023-10-17 MED ORDER — ROSUVASTATIN CALCIUM 40 MG PO TABS: 40.0000 mg | ORAL_TABLET | Freq: Every day | ORAL | 3 refills | Status: DC

## 2023-10-19 ENCOUNTER — Ambulatory Visit (INDEPENDENT_AMBULATORY_CARE_PROVIDER_SITE_OTHER): Payer: 59 | Admitting: Neurology

## 2023-10-19 ENCOUNTER — Encounter: Payer: Self-pay | Admitting: Neurology

## 2023-10-19 ENCOUNTER — Encounter: Payer: Self-pay | Admitting: *Deleted

## 2023-10-19 VITALS — BP 135/67 | HR 77 | Ht 70.0 in | Wt 219.0 lb

## 2023-10-19 DIAGNOSIS — G4719 Other hypersomnia: Secondary | ICD-10-CM

## 2023-10-19 DIAGNOSIS — R5382 Chronic fatigue, unspecified: Secondary | ICD-10-CM | POA: Diagnosis not present

## 2023-10-19 DIAGNOSIS — G4710 Hypersomnia, unspecified: Secondary | ICD-10-CM | POA: Diagnosis not present

## 2023-10-19 DIAGNOSIS — G478 Other sleep disorders: Secondary | ICD-10-CM

## 2023-10-19 NOTE — Patient Instructions (Signed)
 Fatigue If you have fatigue, you feel tired all the time and have a lack of energy or a lack of motivation. Fatigue may make it difficult to start or complete tasks because of exhaustion. Occasional or mild fatigue is often a normal response to activity or life. However, long-term (chronic) or extreme fatigue may be a symptom of a medical condition such as: Depression. Not having enough red blood cells or hemoglobin in the blood (anemia). A problem with a small gland located in the lower front part of the neck (thyroid disorder). Rheumatologic conditions. These are problems related to the body's defense system (immune system). Infections, especially certain viral infections. Fatigue can also lead to negative health outcomes over time. Follow these instructions at home: Medicines Take over-the-counter and prescription medicines only as told by your health care provider. Take a multivitamin if told by your health care provider. Do not use herbal or dietary supplements unless they are approved by your health care provider. Eating and drinking  Avoid heavy meals in the evening. Eat a well-balanced diet, which includes lean proteins, whole grains, plenty of fruits and vegetables, and low-fat dairy products. Avoid eating or drinking too many products with caffeine in them. Avoid alcohol. Drink enough fluid to keep your urine pale yellow. Activity  Exercise regularly, as told by your health care provider. Use or practice techniques to help you relax, such as yoga, tai chi, meditation, or massage therapy. Lifestyle Change situations that cause you stress. Try to keep your work and personal schedules in balance. Do not use recreational or illegal drugs. General instructions Monitor your fatigue for any changes. Go to bed and get up at the same time every day. Avoid fatigue by pacing yourself during the day and getting enough sleep at night. Maintain a healthy weight. Contact a health care  provider if: Your fatigue does not get better. You have a fever. You suddenly lose or gain weight. You have headaches. You have trouble falling asleep or sleeping through the night. You feel angry, guilty, anxious, or sad. You have swelling in your legs or another part of your body. Get help right away if: You feel confused, feel like you might faint, or faint. Your vision is blurry or you have a severe headache. You have severe pain in your abdomen, your back, or the area between your waist and hips (pelvis). You have chest pain, shortness of breath, or an irregular or fast heartbeat. You are unable to urinate, or you urinate less than normal. You have abnormal bleeding from the rectum, nose, lungs, nipples, or, if you are male, the vagina. You vomit blood. You have thoughts about hurting yourself or others. These symptoms may be an emergency. Get help right away. Call 911. Do not wait to see if the symptoms will go away. Do not drive yourself to the hospital. Get help right away if you feel like you may hurt yourself or others, or have thoughts about taking your own life. Go to your nearest emergency room or: Call 911. Call the National Suicide Prevention Lifeline at 952 358 1212 or 988. This is open 24 hours a day. Text the Crisis Text Line at 409-169-6301. Summary If you have fatigue, you feel tired all the time and have a lack of energy or a lack of motivation. Fatigue may make it difficult to start or complete tasks because of exhaustion. Long-term (chronic) or extreme fatigue may be a symptom of a medical condition. Exercise regularly, as told by your health care provider.  Change situations that cause you stress. Try to keep your work and personal schedules in balance. This information is not intended to replace advice given to you by your health care provider. Make sure you discuss any questions you have with your health care provider. Document Revised: 05/24/2021 Document  Reviewed: 05/24/2021 Elsevier Patient Education  2024 [employer].Hypersomnia Hypersomnia is a condition in which a person feels very tired during the day even though the person gets plenty of sleep at night. A person with this condition may take naps during the day and may find it very difficult to wake up from sleep. Hypersomnia may affect a person's ability to think, concentrate, drive, or remember things. What are the causes? The cause of this condition may not be known. Possible causes include: Taking certain medicines. Using drugs or alcohol. Sleep disorders, such as narcolepsy and sleep apnea. Injury to the head, brain, or spinal cord. Tumors. Certain medical conditions. These include: Depression. Diabetes. Gastroesophageal reflux disease (GERD). An underactive thyroid gland (hypothyroidism). What are the signs or symptoms? The main symptoms of hypersomnia include: Feeling very tired throughout the day, regardless of how much sleep you got the night before. Having trouble waking up. Others may find it difficult to wake you up when you are sleeping. Sleeping for longer and longer periods at a time. Taking naps throughout the day. Other symptoms may include: Feeling restless, anxious, or annoyed. Lacking energy. Having trouble with: Remembering. Speaking. Thinking. Loss of appetite. Seeing, hearing, tasting, smelling, or feeling things that are not real (hallucinations). How is this diagnosed? This condition may be diagnosed based on: Your symptoms and medical history. Your sleeping habits. Your health care provider may ask you to write down your sleeping habits in a daily sleep log, along with any symptoms you have. A series of tests that are done while you sleep (sleep study or polysomnogram). A test that measures how quickly you can fall asleep during the day (daytime nap study or multiple sleep latency test). How is this treated? This condition may be treated  by: Following a regular sleep routine. Making lifestyle changes, such as changing your eating habits, getting regular exercise, and avoiding alcohol or caffeinated beverages. Taking medicines to make you more alert (stimulants) during the day. Treating any underlying medical causes of hypersomnia. Follow these instructions at home: Sleep habits Stick to a routine that includes going to bed and waking up at the same times every day and night. Practice a relaxing bedtime routine. This may include reading, meditation, deep breathing, or taking a warm bath before going to sleep. Exercise regularly as told by your health care provider. However, avoid exercising in the hours right before bedtime. Keep your sleep environment at a cooler temperature, darkened, and quiet. Sleep with pillows and a mattress that are comfortable and supportive. Schedule short 20-minute naps for when you feel sleepiest during the day. Talk with your employer or teachers about your hypersomnia. If possible, adjust your schedule so that: You have a regular daytime work schedule. You can take a scheduled nap during the day. You do not have to work or be active at night. Do not eat a heavy meal for a few hours before bedtime. Eat your meals at about the same times every day. Safety  Do not drive or use machinery if you are sleepy. Ask your health care provider if it is safe for you to drive. Wear a life jacket when swimming or spending time near water. General instructions  Take  over-the-counter and prescription medicines only as told by your health care provider. This includes supplements. Avoid drinking alcohol or caffeinated beverages. Keep a sleep log that will help your health care provider manage your condition. This may include information about: What time you go to bed each night. How often you wake up at night. How many hours you sleep at night. How often and for how long you nap during the day. Any  observations from others, such as leg movements during sleep, sleep walking, or snoring. Keep all follow-up visits. This is important. Contact a health care provider if: You have new symptoms. Your symptoms get worse. Get help right away if: You have thoughts about hurting yourself or someone else. Get help right away if you feel like you may hurt yourself or others, or have thoughts about taking your own life. Go to your nearest emergency room or: Call 911. Call the National Suicide Prevention Lifeline at (913)499-6898 or 988. This is open 24 hours a day. Text the Crisis Text Line at 506 836 7840. Summary Hypersomnia refers to a condition in which you feel very tired during the day even though you get plenty of sleep at night. A person with this condition may take naps during the day and may find it very difficult to wake up from sleep. Hypersomnia may affect a person's ability to think, concentrate, drive, or remember things. Treatment may include a regular sleep routine and making some lifestyle changes. This information is not intended to replace advice given to you by your health care provider. Make sure you discuss any questions you have with your health care provider. Document Revised: 07/12/2021 Document Reviewed: 07/12/2021 Elsevier Patient Education  2024 [employer].

## 2023-10-19 NOTE — Progress Notes (Signed)
 Provider:  Melvyn Novas, MD  Primary Care Physician:  Shelva Majestic, MD [address] [place] [address]     Referring Provider: Shelva Majestic, Md [address] [place],  [place] [address]          Chief Complaint according to patient   Patient presents with:                HISTORY OF PRESENT ILLNESS:  Vincent Wilkerson is a 68 y.o. male patient who is here for revisit 10/19/2023 for  continued use of CPAP. Has UARS, very mild OSA, and uses Nuvigil to get through the day. Feels (on medication ) tired at 8 PM and asleep by 9 pm and then gets up at 3-4 AM. So he has been able to sleep 6 hours with CPAP  but he feels excessive sleepy and  fatigued by 2-3 Pm and he bridges this over with caffeine.  Without Nuvigil, he would not stay awake.   See compliance below-  100%  5-12 cm water, no EPR, residual AHI 0.1/h.  UARS, Sleepiness and CPAP follow up.      10-20-2022:   A recent HST had only shown an AHI of 4.5 /h but when he stopped CPAP he snored and had witnessed apnea ( per spouse)- UARS- . He is back on CPAP, feeling better. He sleeps usually 6 hours but  craves an hour more.  He goes to bed at 8-9 PM!. Vincent Wilkerson is residual apnea-hypopnea index on his current CPAP is 0.2/h which is ideal.  His 95th percentile pressure requirement is 11.2 cm water and he has minimal air leakage.  No central apneas are arising.  The machine was used 100% of the last 30 days and each of those days over 4 hours.  Average user time is 6 hours 37 minutes.  This is an AirSense 10 AutoSet by [employer] with a setting between 5 cm and 12 cm water and no EPR.  The serial number is [id number].   He uses Armodafinil for residual Sleepiness, 150  mg and would like the dose increased. He takes it at 6.30 and it wears off at 2.30 PM. He likes to use 200 mg tabs and break them in half, for a dose at 6.30 - and another at 9.30 AM and then would expect an affect into the late afternoon.      His HST in 10- 11-2021 had only documented an AHI of 4.6/h and we allowed the patient to d/c CPAP but he is excessively sleepy and fatigued again and now wonders if the night was representative. He also was achy sore and sweaty- he couldn't sleep well, called it "a horrible night. ". Without the diagnoses of OSA he also can't get coverage for Nuvigil.   Review of Systems: Out of a complete 14 system review, the patient complains of only the following symptoms, and all other reviewed systems are negative.:   ESS at 5/ 12, FSS at 17/ 53 points, and GDS at 1/ 15 points.    Social History   Socioeconomic History   Marital status: Married    Spouse name: Not on file   Number of children: Not on file   Years of education: Not on file   Highest education level: Electrical engineering/ 4 years.   Occupational History   Not on file  Tobacco Use   Smoking status: Never   Smokeless tobacco: Never  Vaping Use   Vaping status: Not on file  Substance and Sexual Activity   Alcohol use: Not Currently    Comment: rare   Drug use: No   Sexual activity: Not on file  Other Topics Concern   Not on file  Social History Narrative   Right handed   3-4 cups caffeine per day (coffee)      Married 2001. 2 stepchildren. Both grown and married  In mid to late 20s. 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, walking dog, hiking, would love to do more woodwork- wants to get shop up and running in 2024   Social Drivers of Health   Financial Resource Strain: Not on file  Food Insecurity: Not on file  Transportation Needs: Not on file  Physical Activity: Not on file  Stress: Not on file  Social Connections: Not on file    Family History  Problem Relation Age of Onset   Depression Mother    Alcohol abuse Mother    COPD Mother    Gallbladder disease Father    Heart disease Father        CHF- died from this   Polycythemia Father    Sleep apnea  Father    Alcohol abuse Sister    Colon cancer Neg Hx    Rectal cancer Neg Hx     Past Medical History:  Diagnosis Date   Arthritis    Left Big Toe    Depression    Hiccups    in the past/  Took Haloperidol to help   Hyperlipidemia    Hypertension    Pneumonia    as a baby   Sleep apnea     Past Surgical History:  Procedure Laterality Date   CATARACT EXTRACTION Bilateral    COLONOSCOPY     MELANOMA EXCISION     Back     Current Outpatient Medications on File Prior to Visit  Medication Sig Dispense Refill   Armodafinil 200 MG TABS Take 1 tablet (200 mg total) by mouth daily. 90 tablet 1   hydrochlorothiazide (MICROZIDE) 12.5 MG capsule TAKE 1 CAPSULE BY MOUTH DAILY 90 capsule 3   Multiple Vitamin (MULTIVITAMIN) tablet Take 1 tablet by mouth daily. Nature Made for men-Take one daily     Multiple Vitamins-Minerals (AIRBORNE PO) Take by mouth daily.     rosuvastatin (CRESTOR) 40 MG tablet Take 1 tablet (40 mg total) by mouth daily. 90 tablet 3   No current facility-administered medications on file prior to visit.    No Known Allergies   DIAGNOSTIC DATA (LABS, IMAGING, TESTING) - I reviewed patient records, labs, notes, testing and imaging myself where available.  Lab Results  Component Value Date   WBC 14.0 (H) 10/12/2023   HGB 16.3 10/12/2023   HCT 48.4 10/12/2023   MCV 94.2 10/12/2023   PLT 259.0 10/12/2023      Component Value Date/Time   NA 141 10/12/2023 0856   K 4.4 10/12/2023 0856   CL 104 10/12/2023 0856   CO2 31 10/12/2023 0856   GLUCOSE 91 10/12/2023 0856   BUN 19 10/12/2023 0856   CREATININE 1.01 10/12/2023 0856   CALCIUM 9.4 10/12/2023 0856   PROT 6.3 10/12/2023 0856   ALBUMIN 4.5 10/12/2023 0856   AST 19 10/12/2023 0856   ALT 23 10/12/2023 0856   ALKPHOS 71 10/12/2023 0856   BILITOT 0.6 10/12/2023 0856  GFRNONAA 84.37 03/25/2010 0844   GFRAA 92 12/06/2007 0945   Lab Results  Component Value Date   CHOL 178 10/12/2023   HDL 51.00  10/12/2023   LDLCALC 107 (H) 10/12/2023   LDLDIRECT 127.1 04/10/2012   TRIG 101.0 10/12/2023   CHOLHDL 3 10/12/2023   Lab Results  Component Value Date   HGBA1C 5.2 10/12/2023   No results found for: "VITAMINB12" Lab Results  Component Value Date   TSH 1.23 02/29/2016    PHYSICAL EXAM:  Today's Vitals   10/19/23 1039  BP: 135/67  Pulse: 77  Weight: 219 lb (99.3 kg)  Height: 5\' 10"  (1.778 m)   Body mass index is 31.42 kg/m.   Wt Readings from Last 3 Encounters:  10/19/23 219 lb (99.3 kg)  10/12/23 217 lb 9.6 oz (98.7 kg)  10/20/22 218 lb (98.9 kg)     Ht Readings from Last 3 Encounters:  10/19/23 5\' 10"  (1.778 m)  10/12/23 5\' 10"  (1.778 m)  10/20/22 5\' 10"  (1.778 m)      General:  The patient is awake, alert and appears not in acute distress. The patient is well groomed. Head: Normocephalic, atraumatic. Neck is supple.  Mallampati 2, lateral narrowing ,  neck circumference:19 inches .  Nasal airflow  patent.  Retrognathia is  not seen.  Dental status: biological  Cardiovascular:  Regular rate and cardiac rhythm by pulse,  without distended neck veins. Respiratory: Lungs are clear to auscultation.  Skin:  Without evidence of ankle edema, or rash. Trunk: The patient's posture is erect.   Neurologic exam : The patient is awake and alert, oriented to place and time.   Memory subjective described as intact.  Attention span & concentration ability appears normal.  Speech is haltered- without  dysarthria, dysphonia or aphasia.  Mood and affect are anxious.    Cranial nerves: no loss of smell or taste reported  Pupils are equal and briskly reactive to light. Funduscopic exam deferred.  Extraocular movements in vertical and horizontal planes were intact and without nystagmus. No Diplopia. Visual fields by finger perimetry are intact. Hearing was intact to tuning fork    Facial sensation intact to fine touch. Facial motor strength is symmetric and tongue and uvula  move midline.  Neck ROM : rotation, tilt and flexion extension were normal for age and shoulder shrug was symmetrical.    Motor exam:  Symmetric bulk, tone and ROM.   Normal tone without cog -wheeling, symmetric grip strength .  Sensory:  Fine touch, pinprick and vibration were tested  and reduced over the left foot.  Proprioception tested in the upper extremities was normal.   Coordination: Rapid alternating movements in the fingers/hands were of normal speed.  Gait and station: Patient could rise unassisted from a seated position, walked without assistive device.  Stance is of normal width/ base and the patient turned with 3 steps.  Toe and heel walk were deferred.  Deep tendon reflexes: in the  upper and lower extremities are symmetric and intact.       ASSESSMENT AND PLAN: 68 y.o. year -old male  here with:  Still full time employed, as is his wife.   Remains fatigued, sleepy - but its manageable under Nuvigil and CPAP>     1) Nuvigil daily 200 mg, can break the tablet in half, take one half in AM and one at Lunchtime.   2) this is an entrained early sleep phase syndrome too, circadian rhythm.  3) continue physical activity,  outdoors.  This helps to keep your inner clock in tune.   4) MILD OSA on CPAP , continue.     I would like to thank Shelva Majestic, MD and Shelva Majestic, Md [address] [place],  [place] [address] for allowing me to meet with and to take care of this pleasant patient.     After spending a total time of  35  minutes face to face and additional time for physical and neurologic examination, review of laboratory studies,  personal review of imaging studies, reports and results of other testing and review of referral information / records as far as provided in visit,   Electronically signed by: Melvyn Novas, MD 10/19/2023 11:05 AM  [facility] Board certified by The [employer] of Sleep Medicine and  Diplomate of the [employer] of Sleep Medicine. Board certified In Neurology through the ABPN, Fellow of the [employer] of Neu[employer].

## 2023-10-25 NOTE — Therapy (Unsigned)
 OUTPATIENT PHYSICAL THERAPY THORACOLUMBAR EVALUATION   Patient Name: Vincent Wilkerson MRN: [id number] DOB:[birth year], 68 y.o., male Today's Date: 10/26/2023  END OF SESSION:  PT End of Session - 10/26/23 0800     Visit Number 1    Number of Visits 16    Date for PT Re-Evaluation 01/18/24    Authorization Type [employer]    PT Start Time 0802    PT Stop Time 0846    PT Time Calculation (min) 44 min    Activity Tolerance Patient tolerated treatment well    Behavior During Therapy WFL for tasks assessed/performed             Past Medical History:  Diagnosis Date   Arthritis    Left Big Toe    Depression    Hiccups    in the past/  Took Haloperidol to help   Hyperlipidemia    Hypertension    Pneumonia    as a baby   Sleep apnea    Past Surgical History:  Procedure Laterality Date   CATARACT EXTRACTION Bilateral    COLONOSCOPY     MELANOMA EXCISION     Back   Patient Active Problem List   Diagnosis Date Noted   Chronic fatigue 10/19/2023   Persistent hypersomnia 10/19/2023   Excessive daytime sleepiness 07/11/2022   UARS (upper airway resistance syndrome) 05/30/2022   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   OSA on CPAP 12/08/2021   History of melanoma 12/15/2020   Sleep apnea with hypersomnolence 04/24/2019   Right calf pain 06/07/2018   Essential hypertension 08/31/2015   Obese 04/15/2015   Right hip pain 01/19/2015   History of basal cell cancer 05/10/2010   Hyperlipidemia 05/11/2007   OSA (obstructive sleep apnea) 05/11/2007    PCP: Shelva Majestic, MD  REFERRING PROVIDER: Shelva Majestic, MD  REFERRING DIAG: M54.50,G89.29 (ICD-10-CM) - Chronic bilateral low back pain without sciatica M25.512,G89.29 (ICD-10-CM) - Chronic left shoulder pain  Rationale for Evaluation and Treatment: Rehabilitation  THERAPY DIAG:  Other low back pain  Acute pain of left shoulder  Muscle weakness (generalized)  ONSET DATE: > 6 months  SUBJECTIVE:                                                                                                                                                                                            SUBJECTIVE STATEMENT: Reports the back pain comes when he over exerts himself either sitting too long or standing on a concrete floor. States he can flex but he cannot bend his back and raise his leg (either leg) at the same time. States that he  hasn't been as active due to his dogs were put down. States that they got another dog a couple weeks ago.   Can't bend over to tie his shoe or change pants at the end of the day while standing up.   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  States he wears sass shoes when he is in the garage.   Played baseball catcher in  highschool and used to have a good arm and he played quarterback - states it is the right shoulder and  is difficult to dress and reach behind his back now. Reports he feels like his right shoulder looks different than his left   States he can just be standing and his shoulder can throb at times.    PERTINENT HISTORY:  HTN  PAIN:  Are you having pain? Yes: NPRS scale: 5/10 Pain location: B sides (obliques area) Pain description: sharp Aggravating factors: standing on concrete, sitting Relieving factors: stretching, resting  Are you having pain? Yes: NPRS scale: 5/10 Pain location: right shoulder along blade on top of shoulder in to upper arm  Pain description: throbbing, sharp Aggravating factors: throwing, dressing, raising up arm, laying on right shoulder  Relieving factors: off loading, rest, heat  PRECAUTIONS: None  RED FLAGS: None   WEIGHT BEARING RESTRICTIONS: No  FALLS:  Has patient fallen in last 6 months? No    OCCUPATION: engineer   PLOF: Independent  PATIENT GOALS: to have less pain in back and shoulder      OBJECTIVE:  Note:  Objective measures were completed at Evaluation unless otherwise noted.  DIAGNOSTIC FINDINGS:  no recent imaging on file  PATIENT SURVEYS:  Patient-specific activity functional scoring scheme (Point to one number):  "0" represents "unable to perform." "10" represents "able to perform at prior level. 0 1 2 3 4 5 6 7 8 9  10 (Date and Score) Activity Initial  Activity Eval     Donning pants standing  0     Washing back with right arm  0     Carrying briefcase on right shoulder  7    Additional Additional Total score = sum of the activity scores/number of activities Minimum detectable change (90%CI) for average score = 2 points Minimum detectable change (90%CI) for single activity score = 3 points PSFS developed by: Jake Seats., & Binkley, J. (1995). Assessing disability and change on individual  patients: a report of a patient specific measure. Physiotherapy [place], 47, [phone]. Reproduced with the permission of the authors  Score: 7/3=  2.33   COGNITION: Overall cognitive status: Within functional limits for tasks assessed     SENSATION: Not tested   POSTURE: sway back, sacral/posterior pelvic sitter, rounded shoulders and forward head  PALPATION: Tenderness to palpation along R lat, thoracic and lumabr paraspinals, left glutes, left hip more posterior than right    UE Measurements Upper Extremity Right  Left    A/PROM MMT A/PROM MMT  Shoulder Flexion      Shoulder Extension      Shoulder Abduction      Shoulder Adduction      Shoulder Internal Rotation      Shoulder External Rotation      Elbow Flexion      Elbow Extension      Wrist Flexion      Wrist Extension  Wrist Supination      Wrist Pronation      Wrist Ulnar Deviation      Wrist Radial Deviation      Grip Strength NA  NA     (Blank rows = not tested)   * pain   Cervical  AROM:         Flexion       Extension       R ROT       L ROT       R SB      L SB       * Pain   (Blank rows = not tested)  LUMBAR ROM:   AROM eval  Flexion 50% limitred  Extension 75% limited ^  Right lateral flexion 75 %limited *  Left lateral flexion 75 %limited *  Right rotation   Left rotation    (Blank rows = not tested)  ^ felt good  * pressure/uncomfortable     LE Measurements Lower Extremity Right EVAL Left EVAL   MMT PROM MMT PROM  Hip Flexion 3+  3+   Hip Extension 3+ 10 3+* 0*  Hip Abduction      Hip Adduction      Hip Internal rotation  35  25  Hip External rotation  50  45  Knee Flexion 4-  3+   Knee Extension 4  4   Ankle Dorsiflexion      Ankle Plantarflexion      Ankle Inversion      Ankle Eversion       (Blank rows = not tested) * pain   LUMBAR SPECIAL TESTS:  Straight leg raise test: Negative, Slump test: Negative, and Ely's test + B (pain in both)  FUNCTIONAL TESTS:  Pain with transitional movements and holding breathing      TREATMENT DATE:                                                                                                                               10/26/2023  Therapeutic Exercise:  On types of shoes to wear, stress on body, anatomy Supine: Prone: lying 5 minutes, POE x15 5" holds, hamstring curls x20 5" holds B (cramping initially )  Seated:  Standing: Neuromuscular Re-education: on posture - see below Manual Therapy: Therapeutic Activity: Self Care:  Trigger Point Dry Needling:  Modalities:      PATIENT EDUCATION:  Education details: on current presentation, on HEP, on clinical outcomes score and POC, on different types of shoes to reduce stress on joints, on padding for floor, on posture and active sitting posture, on anatomy and rationale behind lat muscle involvement  Person educated: Patient Education method: Explanation, Demonstration, and Handouts Education comprehension: verbalized understanding   HOME EXERCISE PROGRAM: [id number]  ASSESSMENT:  CLINICAL IMPRESSION: Patient  presents to PT with complaints of chronic LBP and right shoulder pain that is limiting overall function and QOL. Patient presents  with limitations in ROM, strength and posture that are likely contributing to current presentation. Patient educated in HEP, posture, types of shoes to wear on concrete and importance of daily mobility work. Patient would greatly benefit from skilled PT to improve overall function and QOL.   OBJECTIVE IMPAIRMENTS: decreased activity tolerance, decreased mobility, decreased ROM, decreased strength, improper body mechanics, postural dysfunction, and pain.   ACTIVITY LIMITATIONS: bending, sitting, standing, squatting, sleeping, bathing, dressing, reach over head, hygiene/grooming, and locomotion level  PARTICIPATION LIMITATIONS: cleaning, community activity, and wood working  PERSONAL FACTORS: Age, Fitness, and Time since onset of injury/illness/exacerbation are also affecting patient's functional outcome.   REHAB POTENTIAL: Good  CLINICAL DECISION MAKING: Stable/uncomplicated  EVALUATION COMPLEXITY: Low   GOALS: Goals reviewed with patient? yes  SHORT TERM GOALS: Target date: 12/07/2023   Patient will be independent in self management strategies to improve quality of life and functional outcomes. Baseline: New Program Goal status: INITIAL  2.  Patient will report at least 50% improvement in overall symptoms and/or function to demonstrate improved functional mobility Baseline: 0% better Goal status: INITIAL  3.  Patient will demonstrate painfree LE ROM and MMT. Baseline: painful Goal status: INITIAL       LONG TERM GOALS: Target date: 01/18/2024    Patient will report at least 75% improvement in overall symptoms and/or function to demonstrate improved functional mobility Baseline: 0% better Goal status: INITIAL  2.  Patient will score at least 2 points higher on PSFS average to demonstrate change in overall function. Baseline: see above Goal status:  INITIAL  3.  Patient will be able to don pants standing up at the end of the day to improve ability to change outfits after work.  Baseline: unable Goal status: INITIAL  4.  Patient will report performing daily mobility routine.  Baseline: not currently  Goal status: INITIAL   PLAN:  PT FREQUENCY: 1-2x/week for a total of 16 visits over 12 week certification period  PT DURATION: 12 weeks  PLANNED INTERVENTIONS: 97110-Therapeutic exercises, 97530- Therapeutic activity, 97112- Neuromuscular re-education, 97535- Self Care, 16109- Manual therapy, [phone]- Gait training, [phone]- Orthotic Fit/training, [phone]- Canalith repositioning, [id number]- Aquatic Therapy, 97014- Electrical stimulation (unattended), [phone]- Ionotophoresis 4mg /ml Dexamethasone, Patient/Family education, Balance training, Stair training, Taping, Dry Needling, Joint mobilization, Joint manipulation, Spinal manipulation, Spinal mobilization, Cryotherapy, and Moist heat   PLAN FOR NEXT SESSION:  lat STM and lengthening, posture- sit bones active sitting posture, breath work - open up lower thoracic, thoracic mobility, lumbar mobility, further assess shoulder and neck in future sessions   10:37 AM, 10/26/23 Tereasa Coop, DPT Physical Therapy with [facility]

## 2023-10-26 ENCOUNTER — Encounter: Payer: Self-pay | Admitting: Physical Therapy

## 2023-10-26 ENCOUNTER — Ambulatory Visit (INDEPENDENT_AMBULATORY_CARE_PROVIDER_SITE_OTHER): Payer: 59 | Admitting: Physical Therapy

## 2023-10-26 DIAGNOSIS — M6281 Muscle weakness (generalized): Secondary | ICD-10-CM

## 2023-10-26 DIAGNOSIS — M25512 Pain in left shoulder: Secondary | ICD-10-CM

## 2023-10-26 DIAGNOSIS — M5459 Other low back pain: Secondary | ICD-10-CM

## 2023-11-05 ENCOUNTER — Encounter: Payer: Self-pay | Admitting: Family Medicine

## 2023-11-06 ENCOUNTER — Ambulatory Visit (INDEPENDENT_AMBULATORY_CARE_PROVIDER_SITE_OTHER): Admitting: Physical Therapy

## 2023-11-06 ENCOUNTER — Encounter: Payer: Self-pay | Admitting: Physical Therapy

## 2023-11-06 DIAGNOSIS — M6281 Muscle weakness (generalized): Secondary | ICD-10-CM | POA: Diagnosis not present

## 2023-11-06 DIAGNOSIS — M25512 Pain in left shoulder: Secondary | ICD-10-CM | POA: Diagnosis not present

## 2023-11-06 DIAGNOSIS — M5459 Other low back pain: Secondary | ICD-10-CM

## 2023-11-06 NOTE — Therapy (Signed)
 OUTPATIENT PHYSICAL THERAPY THORACOLUMBAR TREATMENT   Patient Name: Vincent Wilkerson MRN: [id number] DOB:Jun 08, 1956, 68 y.o., male Today's Date: 11/06/2023  END OF SESSION:  PT End of Session - 11/06/23 1215     Visit Number 2    Number of Visits 16    Date for PT Re-Evaluation 01/18/24    Authorization Type [employer]    PT Start Time 1215    PT Stop Time 1305    PT Time Calculation (min) 50 min    Activity Tolerance Patient tolerated treatment well    Behavior During Therapy WFL for tasks assessed/performed             Past Medical History:  Diagnosis Date   Arthritis    Left Big Toe    Depression    Hiccups    in the past/  Took Haloperidol to help   Hyperlipidemia    Hypertension    Pneumonia    as a baby   Sleep apnea    Past Surgical History:  Procedure Laterality Date   CATARACT EXTRACTION Bilateral    COLONOSCOPY     MELANOMA EXCISION     Back   Patient Active Problem List   Diagnosis Date Noted   Chronic fatigue 10/19/2023   Persistent hypersomnia 10/19/2023   Excessive daytime sleepiness 07/11/2022   UARS (upper airway resistance syndrome) 05/30/2022   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   OSA on CPAP 12/08/2021   History of melanoma 12/15/2020   Sleep apnea with hypersomnolence 04/24/2019   Right calf pain 06/07/2018   Essential hypertension 08/31/2015   Obese 04/15/2015   Right hip pain 01/19/2015   History of basal cell cancer 05/10/2010   Hyperlipidemia 05/11/2007   OSA (obstructive sleep apnea) 05/11/2007    PCP: Shelva Majestic, MD  REFERRING PROVIDER: Shelva Majestic, MD  REFERRING DIAG: M54.50,G89.29 (ICD-10-CM) - Chronic bilateral low back pain without sciatica M25.512,G89.29 (ICD-10-CM) - Chronic left shoulder pain  Rationale for Evaluation and Treatment: Rehabilitation  THERAPY DIAG:  Other low back pain  Acute pain of left shoulder  Muscle weakness (generalized)  ONSET DATE: > 6 months  SUBJECTIVE:                                                                                                                                                                                            SUBJECTIVE STATEMENT: 11/06/2023 States that he mowed the grass on Sunday and he had to do some stretches after. States he did the stretches and these helped. Wanted to make sure he was doing them all correctly, has a popping in his right knee with  the knee bends, no pain though.   Eval: Reports the back pain comes when he over exerts himself either sitting too long or standing on a concrete floor. States he can flex but he cannot bend his back and raise his leg (either leg) at the same time. States that he hasn't been as active due to his dogs were put down. States that they got another dog a couple weeks ago.   Can't bend over to tie his shoe or change pants at the end of the day while standing up.   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  States he wears sass shoes when he is in the garage.   Played baseball catcher in  highschool and used to have a good arm and he played quarterback - states it is the right shoulder and  is difficult to dress and reach behind his back now. Reports he feels like his right shoulder looks different than his left   States he can just be standing and his shoulder can throb at times.    PERTINENT HISTORY:  HTN  PAIN:  Are you having pain? Yes: NPRS scale: 2/10 Pain location: B sides (obliques area) Pain description: sharp Aggravating factors: standing on concrete, sitting Relieving factors: stretching, resting  Are you having pain? Yes: NPRS scale: 2/10 Pain location: right shoulder along blade on top of shoulder in to upper arm  Pain description: throbbing, sharp Aggravating factors: throwing, dressing, raising up arm, laying on right shoulder  Relieving factors: off loading, rest,  heat  PRECAUTIONS: None  RED FLAGS: None   WEIGHT BEARING RESTRICTIONS: No  FALLS:  Has patient fallen in last 6 months? No    OCCUPATION: engineer   PLOF: Independent  PATIENT GOALS: to have less pain in back and shoulder      OBJECTIVE:  Note: Objective measures were completed at Evaluation unless otherwise noted.  DIAGNOSTIC FINDINGS:  no recent imaging on file  PATIENT SURVEYS:  Patient-specific activity functional scoring scheme (Point to one number):  "0" represents "unable to perform." "10" represents "able to perform at prior level. 0 1 2 3 4 5 6 7 8 9  10 (Date and Score) Activity Initial  Activity Eval     Donning pants standing  0     Washing back with right arm  0     Carrying briefcase on right shoulder  7    Additional Additional Total score = sum of the activity scores/number of activities Minimum detectable change (90%CI) for average score = 2 points Minimum detectable change (90%CI) for single activity score = 3 points PSFS developed by: Jake Seats., & Binkley, J. (1995). Assessing disability and change on individual  patients: a report of a patient specific measure. Physiotherapy [place], 47, [phone]. Reproduced with the permission of the authors  Score: 7/3=  2.33   COGNITION: Overall cognitive status: Within functional limits for tasks assessed     SENSATION: Not tested   POSTURE: sway back, sacral/posterior pelvic sitter, rounded shoulders and forward head  PALPATION: Tenderness to palpation along R lat, thoracic and lumabr paraspinals, left glutes, left hip more posterior than right    UE Measurements Upper Extremity Right  Left    A/PROM MMT A/PROM MMT  Shoulder Flexion      Shoulder Extension      Shoulder Abduction  Shoulder Adduction      Shoulder Internal Rotation      Shoulder External Rotation      Elbow Flexion      Elbow Extension      Wrist Flexion      Wrist Extension      Wrist  Supination      Wrist Pronation      Wrist Ulnar Deviation      Wrist Radial Deviation      Grip Strength NA  NA     (Blank rows = not tested)   * pain   Cervical  AROM:         Flexion       Extension       R ROT       L ROT       R SB      L SB      * Pain   (Blank rows = not tested)  LUMBAR ROM:   AROM eval  Flexion 50% limitred  Extension 75% limited ^  Right lateral flexion 75 %limited *  Left lateral flexion 75 %limited *  Right rotation   Left rotation    (Blank rows = not tested)  ^ felt good  * pressure/uncomfortable     LE Measurements Lower Extremity Right EVAL Left EVAL   MMT PROM MMT PROM  Hip Flexion 3+  3+   Hip Extension 3+ 10 3+* 0*  Hip Abduction      Hip Adduction      Hip Internal rotation  35  25  Hip External rotation  50  45  Knee Flexion 4-  3+   Knee Extension 4  4   Ankle Dorsiflexion      Ankle Plantarflexion      Ankle Inversion      Ankle Eversion       (Blank rows = not tested) * pain   LUMBAR SPECIAL TESTS:  Straight leg raise test: Negative, Slump test: Negative, and Ely's test + B (pain in both)  FUNCTIONAL TESTS:  Pain with transitional movements and holding breathing      TREATMENT DATE:                                                                                                                               11/06/2023  Therapeutic Exercise:  Review of entire HEP, educated on anatomy and posture Supine: Prone: lying to minutes, POE with pillow under chest x 2 minutes, hamstring curls x20 5" holds B, belly breathing over pillow 5 minutes  Seated:  Standing: Neuromuscular Re-education: long exhale breathing in hook lying with verbal and tactile cues 20 minutes total Manual Therapy: IASTM with percussion gun for vibration to lumbar paraspinals with towel for padding in prone tolerated well  Therapeutic Activity: Self Care:  Trigger Point Dry Needling:  Modalities:      PATIENT EDUCATION:   Education details: on  HEP, on breathing mechanics, anatomy and rationale behind interventions  Person educated: Patient Education method: Explanation, Demonstration, and Handouts Education comprehension: verbalized understanding   HOME EXERCISE PROGRAM: [id number]  ASSESSMENT:  CLINICAL IMPRESSION: 11/06/2023 Session focused on review of home exercise program as well as progression of interventions.  Tolerated long exhale breathing well with pulling along lower lateral ribs.  Improved core engagement with longer exhale.  Answered all questions and educated patient on anatomy as well as importance of breathing to help activate core.  Slight tension in back noted after static position.  Added use of percussion gun and educated patient in safe use of percussion gun at home.  Significantly less pain noted end of session.  Will continue with current plan of care as tolerated.  EVAL: Patient presents to PT with complaints of chronic LBP and right shoulder pain that is limiting overall function and QOL. Patient presents with limitations in ROM, strength and posture that are likely contributing to current presentation. Patient educated in HEP, posture, types of shoes to wear on concrete and importance of daily mobility work. Patient would greatly benefit from skilled PT to improve overall function and QOL.   OBJECTIVE IMPAIRMENTS: decreased activity tolerance, decreased mobility, decreased ROM, decreased strength, improper body mechanics, postural dysfunction, and pain.   ACTIVITY LIMITATIONS: bending, sitting, standing, squatting, sleeping, bathing, dressing, reach over head, hygiene/grooming, and locomotion level  PARTICIPATION LIMITATIONS: cleaning, community activity, and wood working  PERSONAL FACTORS: Age, Fitness, and Time since onset of injury/illness/exacerbation are also affecting patient's functional outcome.   REHAB POTENTIAL: Good  CLINICAL DECISION MAKING:  Stable/uncomplicated  EVALUATION COMPLEXITY: Low   GOALS: Goals reviewed with patient? yes  SHORT TERM GOALS: Target date: 12/07/2023   Patient will be independent in self management strategies to improve quality of life and functional outcomes. Baseline: New Program Goal status: INITIAL  2.  Patient will report at least 50% improvement in overall symptoms and/or function to demonstrate improved functional mobility Baseline: 0% better Goal status: INITIAL  3.  Patient will demonstrate painfree LE ROM and MMT. Baseline: painful Goal status: INITIAL       LONG TERM GOALS: Target date: 01/18/2024    Patient will report at least 75% improvement in overall symptoms and/or function to demonstrate improved functional mobility Baseline: 0% better Goal status: INITIAL  2.  Patient will score at least 2 points higher on PSFS average to demonstrate change in overall function. Baseline: see above Goal status: INITIAL  3.  Patient will be able to don pants standing up at the end of the day to improve ability to change outfits after work.  Baseline: unable Goal status: INITIAL  4.  Patient will report performing daily mobility routine.  Baseline: not currently  Goal status: INITIAL   PLAN:  PT FREQUENCY: 1-2x/week for a total of 16 visits over 12 week certification period  PT DURATION: 12 weeks  PLANNED INTERVENTIONS: 97110-Therapeutic exercises, 97530- Therapeutic activity, 97112- Neuromuscular re-education, 97535- Self Care, 09811- Manual therapy, [phone]- Gait training, [phone]- Orthotic Fit/training, [phone]- Canalith repositioning, [id number]- Aquatic Therapy, 97014- Electrical stimulation (unattended), [phone]- Ionotophoresis 4mg /ml Dexamethasone, Patient/Family education, Balance training, Stair training, Taping, Dry Needling, Joint mobilization, Joint manipulation, Spinal manipulation, Spinal mobilization, Cryotherapy, and Moist heat   PLAN FOR NEXT SESSION:  lat STM and lengthening,  posture- sit bones active sitting posture, breath work - open up lower thoracic, thoracic mobility, lumbar mobility, further assess shoulder and neck in future sessions - floor transfers, carpal tunnel exercises  1:17 PM, 11/06/23 Tereasa Coop, DPT Physical Therapy with [facility]

## 2023-11-13 ENCOUNTER — Ambulatory Visit (INDEPENDENT_AMBULATORY_CARE_PROVIDER_SITE_OTHER): Admitting: Physical Therapy

## 2023-11-13 ENCOUNTER — Encounter: Payer: Self-pay | Admitting: Physical Therapy

## 2023-11-13 ENCOUNTER — Other Ambulatory Visit: Payer: Self-pay

## 2023-11-13 DIAGNOSIS — M6281 Muscle weakness (generalized): Secondary | ICD-10-CM

## 2023-11-13 DIAGNOSIS — M25512 Pain in left shoulder: Secondary | ICD-10-CM

## 2023-11-13 DIAGNOSIS — M5459 Other low back pain: Secondary | ICD-10-CM | POA: Diagnosis not present

## 2023-11-13 DIAGNOSIS — D72829 Elevated white blood cell count, unspecified: Secondary | ICD-10-CM

## 2023-11-13 NOTE — Therapy (Signed)
 OUTPATIENT PHYSICAL THERAPY THORACOLUMBAR TREATMENT   Patient Name: Vincent Wilkerson MRN: [id number] DOB:[birth year], 68 y.o., male Today's Date: 11/13/2023  END OF SESSION:  PT End of Session - 11/13/23 1256     Visit Number 3    Number of Visits 16    Date for PT Re-Evaluation 01/18/24    Authorization Type [employer]    PT Start Time 1304    PT Stop Time 1342    PT Time Calculation (min) 38 min    Activity Tolerance Patient tolerated treatment well    Behavior During Therapy WFL for tasks assessed/performed             Past Medical History:  Diagnosis Date   Arthritis    Left Big Toe    Depression    Hiccups    in the past/  Took Haloperidol to help   Hyperlipidemia    Hypertension    Pneumonia    as a baby   Sleep apnea    Past Surgical History:  Procedure Laterality Date   CATARACT EXTRACTION Bilateral    COLONOSCOPY     MELANOMA EXCISION     Back   Patient Active Problem List   Diagnosis Date Noted   Chronic fatigue 10/19/2023   Persistent hypersomnia 10/19/2023   Excessive daytime sleepiness 07/11/2022   UARS (upper airway resistance syndrome) 05/30/2022   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   OSA on CPAP 12/08/2021   History of melanoma 12/15/2020   Sleep apnea with hypersomnolence 04/24/2019   Right calf pain 06/07/2018   Essential hypertension 08/31/2015   Obese 04/15/2015   Right hip pain 01/19/2015   History of basal cell cancer 05/10/2010   Hyperlipidemia 05/11/2007   OSA (obstructive sleep apnea) 05/11/2007    PCP: Shelva Majestic, MD  REFERRING PROVIDER: Shelva Majestic, MD  REFERRING DIAG: M54.50,G89.29 (ICD-10-CM) - Chronic bilateral low back pain without sciatica M25.512,G89.29 (ICD-10-CM) - Chronic left shoulder pain  Rationale for Evaluation and Treatment: Rehabilitation  THERAPY DIAG:  Other low back pain  Acute pain of left shoulder  Muscle weakness (generalized)  ONSET DATE: > 6 months  SUBJECTIVE:                                                                                                                                                                                            SUBJECTIVE STATEMENT: 11/13/2023 States he is feeling his core and diaphragm working. States he bought on his new shoes and they are helping. States he has been taking longer strides. Right shoulder has bene feeling a little pain but his stretches are helping.  Eval: Reports the back pain comes when he over exerts himself either sitting too long or standing on a concrete floor. States he can flex but he cannot bend his back and raise his leg (either leg) at the same time. States that he hasn't been as active due to his dogs were put down. States that they got another dog a couple weeks ago.   Can't bend over to tie his shoe or change pants at the end of the day while standing up.   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  States he wears sass shoes when he is in the garage.   Played baseball catcher in  highschool and used to have a good arm and he played quarterback - states it is the right shoulder and  is difficult to dress and reach behind his back now. Reports he feels like his right shoulder looks different than his left   States he can just be standing and his shoulder can throb at times.    PERTINENT HISTORY:  HTN  PAIN:  Are you having pain? Yes: NPRS scale: 0/10 Pain location: B sides (obliques area) Pain description: sharp Aggravating factors: standing on concrete, sitting Relieving factors: stretching, resting  Are you having pain? Yes: NPRS scale: 2/10 Pain location: right shoulder along blade on top of shoulder in to upper arm  Pain description: throbbing, sharp Aggravating factors: throwing, dressing, raising up arm, laying on right shoulder  Relieving factors: off loading, rest, heat  PRECAUTIONS:  None  RED FLAGS: None   WEIGHT BEARING RESTRICTIONS: No  FALLS:  Has patient fallen in last 6 months? No    OCCUPATION: engineer   PLOF: Independent  PATIENT GOALS: to have less pain in back and shoulder      OBJECTIVE:  Note: Objective measures were completed at Evaluation unless otherwise noted.  DIAGNOSTIC FINDINGS:  no recent imaging on file  PATIENT SURVEYS:  Patient-specific activity functional scoring scheme (Point to one number):  "0" represents "unable to perform." "10" represents "able to perform at prior level. 0 1 2 3 4 5 6 7 8 9  10 (Date and Score) Activity Initial  Activity Eval     Donning pants standing  0     Washing back with right arm  0     Carrying briefcase on right shoulder  7    Additional Additional Total score = sum of the activity scores/number of activities Minimum detectable change (90%CI) for average score = 2 points Minimum detectable change (90%CI) for single activity score = 3 points PSFS developed by: Jake Seats., & Binkley, J. (1995). Assessing disability and change on individual  patients: a report of a patient specific measure. Physiotherapy [place], 47, [phone]. Reproduced with the permission of the authors  Score: 7/3=  2.33   COGNITION: Overall cognitive status: Within functional limits for tasks assessed     SENSATION: Not tested   POSTURE: sway back, sacral/posterior pelvic sitter, rounded shoulders and forward head  PALPATION: Tenderness to palpation along R lat, thoracic and lumabr paraspinals, left glutes, left hip more posterior than right    UE Measurements Upper Extremity Right  Left    A/PROM MMT A/PROM MMT  Shoulder Flexion      Shoulder Extension      Shoulder Abduction      Shoulder Adduction  Shoulder Internal Rotation      Shoulder External Rotation      Elbow Flexion      Elbow Extension      Wrist Flexion      Wrist Extension      Wrist Supination       Wrist Pronation      Wrist Ulnar Deviation      Wrist Radial Deviation      Grip Strength NA  NA     (Blank rows = not tested)   * pain   Cervical  AROM:         Flexion       Extension       R ROT       L ROT       R SB      L SB      * Pain   (Blank rows = not tested)  LUMBAR ROM:   AROM eval  Flexion 50% limitred  Extension 75% limited ^  Right lateral flexion 75 %limited *  Left lateral flexion 75 %limited *  Right rotation   Left rotation    (Blank rows = not tested)  ^ felt good  * pressure/uncomfortable     LE Measurements Lower Extremity Right EVAL Left EVAL   MMT PROM MMT PROM  Hip Flexion 3+  3+   Hip Extension 3+ 10 3+* 0*  Hip Abduction      Hip Adduction      Hip Internal rotation  35  25  Hip External rotation  50  45  Knee Flexion 4-  3+   Knee Extension 4  4   Ankle Dorsiflexion      Ankle Plantarflexion      Ankle Inversion      Ankle Eversion       (Blank rows = not tested) * pain   LUMBAR SPECIAL TESTS:  Straight leg raise test: Negative, Slump test: Negative, and Ely's test + B (pain in both)  FUNCTIONAL TESTS:  Pain with transitional movements and holding breathing      TREATMENT DATE:                                                                                                                               11/13/2023  Therapeutic Exercise:  Review of entire HEP  Supine: towel down back 5 minutes  Prone:   Seated:  Standing: shoulder scapular retraction seated and standing with and without wall - 10 minutes Neuromuscular Re-education: ergonomic set up in sitting and standing 18 minutes - printoffs for reference Manual Therapy: IASTM with percussion gun for vibration to R shoulder muscle  10 minutes Therapeutic Activity: Self Care:  Trigger Point Dry Needling:  Modalities:      PATIENT EDUCATION:  Education details: on HEP, on breathing mechanics, anatomy and rationale behind interventions  Person educated:  Patient Education method: Explanation, Demonstration, and Handouts Education comprehension:  verbalized understanding   HOME EXERCISE PROGRAM: [id number]  ASSESSMENT:  CLINICAL IMPRESSION: 11/13/2023 Focused on right shoulder today. Educated patient on posture and ergonomic set up in standing and sitting. Answered all questions about difference between active vs sitting posture. Tolerated manual to right shoulder well and added thoracic mobility stretch to help with scapular mobility. No pain noted end of session. Will continue with current POC as tolerated.   EVAL: Patient presents to PT with complaints of chronic LBP and right shoulder pain that is limiting overall function and QOL. Patient presents with limitations in ROM, strength and posture that are likely contributing to current presentation. Patient educated in HEP, posture, types of shoes to wear on concrete and importance of daily mobility work. Patient would greatly benefit from skilled PT to improve overall function and QOL.   OBJECTIVE IMPAIRMENTS: decreased activity tolerance, decreased mobility, decreased ROM, decreased strength, improper body mechanics, postural dysfunction, and pain.   ACTIVITY LIMITATIONS: bending, sitting, standing, squatting, sleeping, bathing, dressing, reach over head, hygiene/grooming, and locomotion level  PARTICIPATION LIMITATIONS: cleaning, community activity, and wood working  PERSONAL FACTORS: Age, Fitness, and Time since onset of injury/illness/exacerbation are also affecting patient's functional outcome.   REHAB POTENTIAL: Good  CLINICAL DECISION MAKING: Stable/uncomplicated  EVALUATION COMPLEXITY: Low   GOALS: Goals reviewed with patient? yes  SHORT TERM GOALS: Target date: 12/07/2023   Patient will be independent in self management strategies to improve quality of life and functional outcomes. Baseline: New Program Goal status: INITIAL  2.  Patient will report at least 50% improvement  in overall symptoms and/or function to demonstrate improved functional mobility Baseline: 0% better Goal status: INITIAL  3.  Patient will demonstrate painfree LE ROM and MMT. Baseline: painful Goal status: INITIAL       LONG TERM GOALS: Target date: 01/18/2024    Patient will report at least 75% improvement in overall symptoms and/or function to demonstrate improved functional mobility Baseline: 0% better Goal status: INITIAL  2.  Patient will score at least 2 points higher on PSFS average to demonstrate change in overall function. Baseline: see above Goal status: INITIAL  3.  Patient will be able to don pants standing up at the end of the day to improve ability to change outfits after work.  Baseline: unable Goal status: INITIAL  4.  Patient will report performing daily mobility routine.  Baseline: not currently  Goal status: INITIAL   PLAN:  PT FREQUENCY: 1-2x/week for a total of 16 visits over 12 week certification period  PT DURATION: 12 weeks  PLANNED INTERVENTIONS: 97110-Therapeutic exercises, 97530- Therapeutic activity, 97112- Neuromuscular re-education, 97535- Self Care, 16109- Manual therapy, [phone]- Gait training, [phone]- Orthotic Fit/training, [phone]- Canalith repositioning, [phone]- Aquatic Therapy, 97014- Electrical stimulation (unattended), [phone]- Ionotophoresis 4mg /ml Dexamethasone, Patient/Family education, Balance training, Stair training, Taping, Dry Needling, Joint mobilization, Joint manipulation, Spinal manipulation, Spinal mobilization, Cryotherapy, and Moist heat   PLAN FOR NEXT SESSION:  lat STM and lengthening, posture- sit bones active sitting posture, breath work - open up lower thoracic, thoracic mobility, lumbar mobility, further assess shoulder and neck in future sessions - floor transfers, carpal tunnel exercises   2:36 PM, 11/13/23 Tereasa Coop, DPT Physical Therapy with [facility]

## 2023-11-20 ENCOUNTER — Encounter: Payer: Self-pay | Admitting: Physical Therapy

## 2023-11-20 ENCOUNTER — Ambulatory Visit (INDEPENDENT_AMBULATORY_CARE_PROVIDER_SITE_OTHER): Admitting: Physical Therapy

## 2023-11-20 DIAGNOSIS — M5459 Other low back pain: Secondary | ICD-10-CM | POA: Diagnosis not present

## 2023-11-20 DIAGNOSIS — M25512 Pain in left shoulder: Secondary | ICD-10-CM

## 2023-11-20 DIAGNOSIS — M6281 Muscle weakness (generalized): Secondary | ICD-10-CM | POA: Diagnosis not present

## 2023-11-20 NOTE — Therapy (Signed)
 OUTPATIENT PHYSICAL THERAPY THORACOLUMBAR TREATMENT   Patient Name: Vincent Wilkerson MRN: [id number] DOB:16-Apr-1956, 68 y.o., male Today's Date: 11/20/2023  END OF SESSION:  PT End of Session - 11/20/23 1216     Visit Number 4    Number of Visits 16    Date for PT Re-Evaluation 01/18/24    Authorization Type [employer]    PT Start Time 1217    PT Stop Time 1255    PT Time Calculation (min) 38 min    Activity Tolerance Patient tolerated treatment well    Behavior During Therapy WFL for tasks assessed/performed             Past Medical History:  Diagnosis Date   Arthritis    Left Big Toe    Depression    Hiccups    in the past/  Took Haloperidol to help   Hyperlipidemia    Hypertension    Pneumonia    as a baby   Sleep apnea    Past Surgical History:  Procedure Laterality Date   CATARACT EXTRACTION Bilateral    COLONOSCOPY     MELANOMA EXCISION     Back   Patient Active Problem List   Diagnosis Date Noted   Chronic fatigue 10/19/2023   Persistent hypersomnia 10/19/2023   Excessive daytime sleepiness 07/11/2022   UARS (upper airway resistance syndrome) 05/30/2022   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   OSA on CPAP 12/08/2021   History of melanoma 12/15/2020   Sleep apnea with hypersomnolence 04/24/2019   Right calf pain 06/07/2018   Essential hypertension 08/31/2015   Obese 04/15/2015   Right hip pain 01/19/2015   History of basal cell cancer 05/10/2010   Hyperlipidemia 05/11/2007   OSA (obstructive sleep apnea) 05/11/2007    PCP: Shelva Majestic, MD  REFERRING PROVIDER: Shelva Majestic, MD  REFERRING DIAG: M54.50,G89.29 (ICD-10-CM) - Chronic bilateral low back pain without sciatica M25.512,G89.29 (ICD-10-CM) - Chronic left shoulder pain  Rationale for Evaluation and Treatment: Rehabilitation  THERAPY DIAG:  Other low back pain  Acute pain of left shoulder  Muscle weakness (generalized)  ONSET DATE: > 6 months  SUBJECTIVE:                                                                                                                                                                                            SUBJECTIVE STATEMENT: 11/20/2023 States he is doing his exercises and feels likes are improving. States he would like to do some shoulder exercises. States he has been working on his posture at work.  Eval: Reports the back pain comes when he over exerts himself  either sitting too long or standing on a concrete floor. States he can flex but he cannot bend his back and raise his leg (either leg) at the same time. States that he hasn't been as active due to his dogs were put down. States that they got another dog a couple weeks ago.   Can't bend over to tie his shoe or change pants at the end of the day while standing up.   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  States he wears sass shoes when he is in the garage.   Played baseball catcher in  highschool and used to have a good arm and he played quarterback - states it is the right shoulder and  is difficult to dress and reach behind his back now. Reports he feels like his right shoulder looks different than his left   States he can just be standing and his shoulder can throb at times.    PERTINENT HISTORY:  HTN  PAIN:  Are you having pain? Yes: NPRS scale: 1/10 Pain location: B sides (obliques area) Pain description: sharp Aggravating factors: standing on concrete, sitting Relieving factors: stretching, resting  Are you having pain? Yes: NPRS scale: 2/10 Pain location: right shoulder along blade on top of shoulder in to upper arm  Pain description: throbbing, sharp Aggravating factors: throwing, dressing, raising up arm, laying on right shoulder  Relieving factors: off loading, rest, heat  PRECAUTIONS: None  RED FLAGS: None   WEIGHT BEARING RESTRICTIONS:  No  FALLS:  Has patient fallen in last 6 months? No    OCCUPATION: engineer   PLOF: Independent  PATIENT GOALS: to have less pain in back and shoulder      OBJECTIVE:  Note: Objective measures were completed at Evaluation unless otherwise noted.  DIAGNOSTIC FINDINGS:  no recent imaging on file  PATIENT SURVEYS:  Patient-specific activity functional scoring scheme (Point to one number):  "0" represents "unable to perform." "10" represents "able to perform at prior level. 0 1 2 3 4 5 6 7 8 9  10 (Date and Score) Activity Initial  Activity Eval     Donning pants standing  0     Washing back with right arm  0     Carrying briefcase on right shoulder  7    Additional Additional Total score = sum of the activity scores/number of activities Minimum detectable change (90%CI) for average score = 2 points Minimum detectable change (90%CI) for single activity score = 3 points PSFS developed by: Jake Seats., & Binkley, J. (1995). Assessing disability and change on individual  patients: a report of a patient specific measure. Physiotherapy [place], 47, [phone]. Reproduced with the permission of the authors  Score: 7/3=  2.33   COGNITION: Overall cognitive status: Within functional limits for tasks assessed     SENSATION: Not tested   POSTURE: sway back, sacral/posterior pelvic sitter, rounded shoulders and forward head  PALPATION: Tenderness to palpation along R lat, thoracic and lumabr paraspinals, left glutes, left hip more posterior than right    UE Measurements Upper Extremity Right  Left    A/PROM MMT A/PROM MMT  Shoulder Flexion      Shoulder Extension      Shoulder Abduction      Shoulder Adduction      Shoulder Internal Rotation  Shoulder External Rotation      Elbow Flexion      Elbow Extension      Wrist Flexion      Wrist Extension      Wrist Supination      Wrist Pronation      Wrist Ulnar Deviation      Wrist Radial  Deviation      Grip Strength NA  NA     (Blank rows = not tested)   * pain   Cervical  AROM:         Flexion       Extension       R ROT       L ROT       R SB      L SB      * Pain   (Blank rows = not tested)  LUMBAR ROM:   AROM eval  Flexion 50% limitred  Extension 75% limited ^  Right lateral flexion 75 %limited *  Left lateral flexion 75 %limited *  Right rotation   Left rotation    (Blank rows = not tested)  ^ felt good  * pressure/uncomfortable     LE Measurements Lower Extremity Right EVAL Left EVAL   MMT PROM MMT PROM  Hip Flexion 3+  3+   Hip Extension 3+ 10 3+* 0*  Hip Abduction      Hip Adduction      Hip Internal rotation  35  25  Hip External rotation  50  45  Knee Flexion 4-  3+   Knee Extension 4  4   Ankle Dorsiflexion      Ankle Plantarflexion      Ankle Inversion      Ankle Eversion       (Blank rows = not tested) * pain   LUMBAR SPECIAL TESTS:  Straight leg raise test: Negative, Slump test: Negative, and Ely's test + B (pain in both)  FUNCTIONAL TESTS:  Pain with transitional movements and holding breathing      TREATMENT DATE:                                                                                                                               11/20/2023  Therapeutic Exercise:  Review of entire HEP    Supine: towel down back 5 minutes, PROM to right shoulder 5 minutes, horizontal shoulder abd 3x10 GTB, shoulder ext/scap retraction 3x10 B  Prone:   Seated:  Standing: shoulder extension with towel 3x10 B, PEC STRETCH x20 B 5" holds Neuromuscular Re-education:  Manual Therapy:   Therapeutic Activity: Self Care:  Trigger Point Dry Needling:  Modalities:      PATIENT EDUCATION:  Education details: on HEP,   Person educated: Patient Education method: Programmer, multimedia, Facilities manager, and Handouts Education comprehension: verbalized understanding   HOME EXERCISE PROGRAM: [id number]  ASSESSMENT:  CLINICAL  IMPRESSION: 11/20/2023 Continued to progress exercises as tolerated. Cued  patient to reduce shoulder hike with exercises and rest as needed between exercises. No increase in symptoms noted but intermittent pain noted during session. Answered all questions and added new exercises to HEP. Will continue with current POC as tolerated.   EVAL: Patient presents to PT with complaints of chronic LBP and right shoulder pain that is limiting overall function and QOL. Patient presents with limitations in ROM, strength and posture that are likely contributing to current presentation. Patient educated in HEP, posture, types of shoes to wear on concrete and importance of daily mobility work. Patient would greatly benefit from skilled PT to improve overall function and QOL.   OBJECTIVE IMPAIRMENTS: decreased activity tolerance, decreased mobility, decreased ROM, decreased strength, improper body mechanics, postural dysfunction, and pain.   ACTIVITY LIMITATIONS: bending, sitting, standing, squatting, sleeping, bathing, dressing, reach over head, hygiene/grooming, and locomotion level  PARTICIPATION LIMITATIONS: cleaning, community activity, and wood working  PERSONAL FACTORS: Age, Fitness, and Time since onset of injury/illness/exacerbation are also affecting patient's functional outcome.   REHAB POTENTIAL: Good  CLINICAL DECISION MAKING: Stable/uncomplicated  EVALUATION COMPLEXITY: Low   GOALS: Goals reviewed with patient? yes  SHORT TERM GOALS: Target date: 12/07/2023   Patient will be independent in self management strategies to improve quality of life and functional outcomes. Baseline: New Program Goal status: INITIAL  2.  Patient will report at least 50% improvement in overall symptoms and/or function to demonstrate improved functional mobility Baseline: 0% better Goal status: INITIAL  3.  Patient will demonstrate painfree LE ROM and MMT. Baseline: painful Goal status: INITIAL       LONG  TERM GOALS: Target date: 01/18/2024    Patient will report at least 75% improvement in overall symptoms and/or function to demonstrate improved functional mobility Baseline: 0% better Goal status: INITIAL  2.  Patient will score at least 2 points higher on PSFS average to demonstrate change in overall function. Baseline: see above Goal status: INITIAL  3.  Patient will be able to don pants standing up at the end of the day to improve ability to change outfits after work.  Baseline: unable Goal status: INITIAL  4.  Patient will report performing daily mobility routine.  Baseline: not currently  Goal status: INITIAL   PLAN:  PT FREQUENCY: 1-2x/week for a total of 16 visits over 12 week certification period  PT DURATION: 12 weeks  PLANNED INTERVENTIONS: 97110-Therapeutic exercises, 97530- Therapeutic activity, 97112- Neuromuscular re-education, 97535- Self Care, 40981- Manual therapy, [phone]- Gait training, [phone]- Orthotic Fit/training, [phone]- Canalith repositioning, [phone]- Aquatic Therapy, 97014- Electrical stimulation (unattended), [phone]- Ionotophoresis 4mg /ml Dexamethasone, Patient/Family education, Balance training, Stair training, Taping, Dry Needling, Joint mobilization, Joint manipulation, Spinal manipulation, Spinal mobilization, Cryotherapy, and Moist heat   PLAN FOR NEXT SESSION:  lat STM and lengthening, posture- sit bones active sitting posture, breath work - open up lower thoracic, thoracic mobility, lumbar mobility, further assess shoulder and neck in future sessions - floor transfers, carpal tunnel exercises   12:58 PM, 11/20/23 Tereasa Coop, DPT Physical Therapy with [facility]

## 2023-12-04 ENCOUNTER — Encounter: Payer: Self-pay | Admitting: Physical Therapy

## 2023-12-04 ENCOUNTER — Ambulatory Visit (INDEPENDENT_AMBULATORY_CARE_PROVIDER_SITE_OTHER): Admitting: Physical Therapy

## 2023-12-04 DIAGNOSIS — M6281 Muscle weakness (generalized): Secondary | ICD-10-CM

## 2023-12-04 DIAGNOSIS — M5459 Other low back pain: Secondary | ICD-10-CM | POA: Diagnosis not present

## 2023-12-04 DIAGNOSIS — M25512 Pain in left shoulder: Secondary | ICD-10-CM | POA: Diagnosis not present

## 2023-12-04 NOTE — Therapy (Addendum)
 OUTPATIENT PHYSICAL THERAPY THORACOLUMBAR TREATMENT PHYSICAL THERAPY DISCHARGE SUMMARY  Visits from Start of Care: 5  Current functional level related to goals / functional outcomes: Could not assess - unplanned discharge   Remaining deficits: Could not assess - unplanned discharge   Education / Equipment: Could not assess - unplanned discharge   Patient agrees to discharge. Patient goals were not met. Patient is being discharged due to not returning since the last visit.  9:10 AM, 01/15/24 Tabitha Ewings, DPT Physical Therapy with [facility]    Patient Name: Vincent Wilkerson MRN: [id number] DOB:Sep 24, 1955, 68 y.o., male Today's Date: 12/04/2023  END OF SESSION:  PT End of Session - 12/04/23 1217     Visit Number 5    Number of Visits 16    Date for PT Re-Evaluation 01/18/24    Authorization Type [employer]    PT Start Time 1217    PT Stop Time 1300    PT Time Calculation (min) 43 min    Activity Tolerance Patient tolerated treatment well    Behavior During Therapy WFL for tasks assessed/performed             Past Medical History:  Diagnosis Date   Arthritis    Left Big Toe    Depression    Hiccups    in the past/  Took Haloperidol to help   Hyperlipidemia    Hypertension    Pneumonia    as a baby   Sleep apnea    Past Surgical History:  Procedure Laterality Date   CATARACT EXTRACTION Bilateral    COLONOSCOPY     MELANOMA EXCISION     Back   Patient Active Problem List   Diagnosis Date Noted   Chronic fatigue 10/19/2023   Persistent hypersomnia 10/19/2023   Excessive daytime sleepiness 07/11/2022   UARS (upper airway resistance syndrome) 05/30/2022   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   OSA on CPAP 12/08/2021   History of melanoma 12/15/2020   Sleep apnea with hypersomnolence 04/24/2019   Right calf pain 06/07/2018   Essential hypertension 08/31/2015   Obese 04/15/2015   Right hip pain 01/19/2015   History of basal cell cancer 05/10/2010    Hyperlipidemia 05/11/2007   OSA (obstructive sleep apnea) 05/11/2007    PCP: Almira Jaeger, MD  REFERRING PROVIDER: Almira Jaeger, MD  REFERRING DIAG: M54.50,G89.29 (ICD-10-CM) - Chronic bilateral low back pain without sciatica M25.512,G89.29 (ICD-10-CM) - Chronic left shoulder pain  Rationale for Evaluation and Treatment: Rehabilitation  THERAPY DIAG:  Other low back pain  Acute pain of left shoulder  Muscle weakness (generalized)  ONSET DATE: > 6 months  SUBJECTIVE:  SUBJECTIVE STATEMENT: 12/04/2023 States today is not a good day. States work is stressful. States his schedule is difficult with both his wife and him going to PT. States his posture is better. Lingering pain in right forearm and would like to go over some exercises. Pushing the mower aggravates his back.   Eval: Reports the back pain comes when he over exerts himself either sitting too long or standing on a concrete floor. States he can flex but he cannot bend his back and raise his leg (either leg) at the same time. States that he hasn't been as active due to his dogs were put down. States that they got another dog a couple weeks ago.   Can't bend over to tie his shoe or change pants at the end of the day while standing up.   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  States he wears sass shoes when he is in the garage.   Played baseball catcher in  highschool and used to have a good arm and he played quarterback - states it is the right shoulder and  is difficult to dress and reach behind his back now. Reports he feels like his right shoulder looks different than his left   States he can just be standing and his shoulder  can throb at times.    PERTINENT HISTORY:  HTN  PAIN:  Are you having pain? Yes: NPRS scale: 1/10 Pain location: B sides (obliques area) Pain description: sharp Aggravating factors: standing on concrete, sitting Relieving factors: stretching, resting  Are you having pain? Yes: NPRS scale: 3/10 Pain location: right arm  Pain description: throbbing, sharp Aggravating factors: throwing, dressing, raising up arm, laying on right shoulder  Relieving factors: off loading, rest, heat  PRECAUTIONS: None  RED FLAGS: None   WEIGHT BEARING RESTRICTIONS: No  FALLS:  Has patient fallen in last 6 months? No    OCCUPATION: engineer   PLOF: Independent  PATIENT GOALS: to have less pain in back and shoulder      OBJECTIVE:  Note: Objective measures were completed at Evaluation unless otherwise noted.  DIAGNOSTIC FINDINGS:  no recent imaging on file  PATIENT SURVEYS:  Patient-specific activity functional scoring scheme (Point to one number):  "0" represents "unable to perform." "10" represents "able to perform at prior level. 0 1 2 3 4 5 6 7 8 9  10 (Date and Score) Activity Initial  Activity Eval     Donning pants standing  0     Washing back with right arm  0     Carrying briefcase on right shoulder  7    Additional Additional Total score = sum of the activity scores/number of activities Minimum detectable change (90%CI) for average score = 2 points Minimum detectable change (90%CI) for single activity score = 3 points PSFS developed by: [place] Spitz., & Binkley, J. (1995). Assessing disability and change on individual  patients: a report of a patient specific measure. Physiotherapy [place], 47, [phone]. Reproduced with the permission of the authors  Score: 7/3=  2.33   COGNITION: Overall cognitive status: Within functional limits for tasks assessed     SENSATION: Not tested   POSTURE: sway back, sacral/posterior pelvic sitter, rounded  shoulders and forward head  PALPATION: Tenderness to palpation along R lat, thoracic and lumabr paraspinals, left glutes, left hip more posterior  than right    UE Measurements Upper Extremity Right  Left    A/PROM MMT A/PROM MMT  Shoulder Flexion      Shoulder Extension      Shoulder Abduction      Shoulder Adduction      Shoulder Internal Rotation      Shoulder External Rotation      Elbow Flexion      Elbow Extension      Wrist Flexion      Wrist Extension      Wrist Supination      Wrist Pronation      Wrist Ulnar Deviation      Wrist Radial Deviation      Grip Strength NA  NA     (Blank rows = not tested)   * pain   Cervical  AROM:         Flexion       Extension       R ROT       L ROT       R SB      L SB      * Pain   (Blank rows = not tested)  LUMBAR ROM:   AROM eval  Flexion 50% limitred  Extension 75% limited ^  Right lateral flexion 75 %limited *  Left lateral flexion 75 %limited *  Right rotation   Left rotation    (Blank rows = not tested)  ^ felt good  * pressure/uncomfortable     LE Measurements Lower Extremity Right EVAL Left EVAL   MMT PROM MMT PROM  Hip Flexion 3+  3+   Hip Extension 3+ 10 3+* 0*  Hip Abduction      Hip Adduction      Hip Internal rotation  35  25  Hip External rotation  50  45  Knee Flexion 4-  3+   Knee Extension 4  4   Ankle Dorsiflexion      Ankle Plantarflexion      Ankle Inversion      Ankle Eversion       (Blank rows = not tested) * pain   LUMBAR SPECIAL TESTS:  Straight leg raise test: Negative, Slump test: Negative, and Ely's test + B (pain in both)  FUNCTIONAL TESTS:  Pain with transitional movements and holding breathing      TREATMENT DATE:                                                                                                                               12/04/2023  Therapeutic Exercise:  Review of entire HEP    Supine:   Prone:   Seated:  Standing: shoulder extension  with towel x15 5" holds - verbal and tactile cues Neuromuscular Re-education:  Manual Therapy:   Therapeutic Activity: practice mowing lawn with weighted chair for form and posture and practice with semi lunge position to mimic how to  reduce stretss on back 15 minutes Self Care: on stress management and conversation strategies - about feelings wheel and journaling - 20 minutes  Trigger Point Dry Needling:  Modalities:      PATIENT EDUCATION:  Education details: on HEP,  see above Person educated: Patient Education method: Explanation, Demonstration, and Handouts Education comprehension: verbalized understanding   HOME EXERCISE PROGRAM: [id number]  ASSESSMENT:  CLINICAL IMPRESSION: 12/04/2023 Answered all questions and reviewed entire HEP. Practiced mowing lawn with reduced twisting and more leg power with turning  to reduce stress on back. Discussed and educated patient in different coping strategies for daily stress, identifying emotions and conversations to have with family as well as pastor if comfortable. Patient verbalized agreement with journaling and talking to someone if feelings of "hate" and "stress" intensify. No pain noted during or after session.  EVAL: Patient presents to PT with complaints of chronic LBP and right shoulder pain that is limiting overall function and QOL. Patient presents with limitations in ROM, strength and posture that are likely contributing to current presentation. Patient educated in HEP, posture, types of shoes to wear on concrete and importance of daily mobility work. Patient would greatly benefit from skilled PT to improve overall function and QOL.   OBJECTIVE IMPAIRMENTS: decreased activity tolerance, decreased mobility, decreased ROM, decreased strength, improper body mechanics, postural dysfunction, and pain.   ACTIVITY LIMITATIONS: bending, sitting, standing, squatting, sleeping, bathing, dressing, reach over head, hygiene/grooming, and  locomotion level  PARTICIPATION LIMITATIONS: cleaning, community activity, and wood working  PERSONAL FACTORS: Age, Fitness, and Time since onset of injury/illness/exacerbation are also affecting patient's functional outcome.   REHAB POTENTIAL: Good  CLINICAL DECISION MAKING: Stable/uncomplicated  EVALUATION COMPLEXITY: Low   GOALS: Goals reviewed with patient? yes  SHORT TERM GOALS: Target date: 12/07/2023   Patient will be independent in self management strategies to improve quality of life and functional outcomes. Baseline: New Program Goal status: INITIAL  2.  Patient will report at least 50% improvement in overall symptoms and/or function to demonstrate improved functional mobility Baseline: 0% better Goal status: INITIAL  3.  Patient will demonstrate painfree LE ROM and MMT. Baseline: painful Goal status: INITIAL       LONG TERM GOALS: Target date: 01/18/2024    Patient will report at least 75% improvement in overall symptoms and/or function to demonstrate improved functional mobility Baseline: 0% better Goal status: INITIAL  2.  Patient will score at least 2 points higher on PSFS average to demonstrate change in overall function. Baseline: see above Goal status: INITIAL  3.  Patient will be able to don pants standing up at the end of the day to improve ability to change outfits after work.  Baseline: unable Goal status: INITIAL  4.  Patient will report performing daily mobility routine.  Baseline: not currently  Goal status: INITIAL   PLAN:  PT FREQUENCY: 1-2x/week for a total of 16 visits over 12 week certification period  PT DURATION: 12 weeks  PLANNED INTERVENTIONS: 97110-Therapeutic exercises, 97530- Therapeutic activity, [id number]- Neuromuscular re-education, 97535- Self Care, [id number]- Manual therapy, [phone]- Gait training, [phone]- Orthotic Fit/training, [phone]- Canalith repositioning, [id number]- Aquatic Therapy, [id number]- Electrical stimulation (unattended), [phone]-  Ionotophoresis 4mg /ml Dexamethasone, Patient/Family education, Balance training, Stair training, Taping, Dry Needling, Joint mobilization, Joint manipulation, Spinal manipulation, Spinal mobilization, Cryotherapy, and Moist heat   PLAN FOR NEXT SESSION:   F/u with feelings wheel lat STM and lengthening, posture- sit bones active sitting posture, breath work - open up lower thoracic, thoracic mobility, lumbar  mobility, further assess shoulder and neck in future sessions - floor transfers, carpal tunnel exercises   1:39 PM, 12/04/23 Tabitha Ewings, DPT Physical Therapy with [facility]

## 2023-12-11 ENCOUNTER — Encounter: Admitting: Physical Therapy

## 2023-12-17 ENCOUNTER — Other Ambulatory Visit: Payer: Self-pay | Admitting: Family Medicine

## 2023-12-17 DIAGNOSIS — I10 Essential (primary) hypertension: Secondary | ICD-10-CM

## 2023-12-18 ENCOUNTER — Encounter: Admitting: Physical Therapy

## 2023-12-25 ENCOUNTER — Encounter: Admitting: Physical Therapy

## 2024-01-01 ENCOUNTER — Encounter: Admitting: Physical Therapy

## 2024-01-18 ENCOUNTER — Other Ambulatory Visit: Payer: Self-pay

## 2024-01-18 ENCOUNTER — Encounter: Payer: Self-pay | Admitting: Neurology

## 2024-01-18 ENCOUNTER — Other Ambulatory Visit

## 2024-01-18 MED ORDER — ARMODAFINIL 200 MG PO TABS: 200.0000 mg | ORAL_TABLET | Freq: Every day | ORAL | 1 refills | Status: DC

## 2024-01-22 ENCOUNTER — Ambulatory Visit: Payer: Self-pay | Admitting: Family Medicine

## 2024-01-22 ENCOUNTER — Other Ambulatory Visit (INDEPENDENT_AMBULATORY_CARE_PROVIDER_SITE_OTHER)

## 2024-01-22 ENCOUNTER — Encounter: Payer: Self-pay | Admitting: Neurology

## 2024-01-22 DIAGNOSIS — D72829 Elevated white blood cell count, unspecified: Secondary | ICD-10-CM | POA: Diagnosis not present

## 2024-01-22 LAB — CBC WITH DIFFERENTIAL/PLATELET
Basophils Absolute: 0.1 10*3/uL (ref 0.0–0.1)
Basophils Relative: 0.5 % (ref 0.0–3.0)
Eosinophils Absolute: 0.4 10*3/uL (ref 0.0–0.7)
Eosinophils Relative: 2.1 % (ref 0.0–5.0)
HCT: 46.9 % (ref 39.0–52.0)
Hemoglobin: 16.4 g/dL (ref 13.0–17.0)
Lymphocytes Relative: 45.1 % (ref 12.0–46.0)
Lymphs Abs: 7.9 10*3/uL — ABNORMAL HIGH (ref 0.7–4.0)
MCHC: 35 g/dL (ref 30.0–36.0)
MCV: 92 fl (ref 78.0–100.0)
Monocytes Absolute: 1 10*3/uL (ref 0.1–1.0)
Monocytes Relative: 5.6 % (ref 3.0–12.0)
Neutro Abs: 8.1 10*3/uL — ABNORMAL HIGH (ref 1.4–7.7)
Neutrophils Relative %: 46.7 % (ref 43.0–77.0)
Platelets: 194 10*3/uL (ref 150.0–400.0)
RBC: 5.1 Mil/uL (ref 4.22–5.81)
RDW: 13.1 % (ref 11.5–15.5)
WBC: 17.4 10*3/uL — ABNORMAL HIGH (ref 4.0–10.5)

## 2024-01-22 NOTE — Telephone Encounter (Signed)
 Spoke w/Sana B, pharmacist at [facility]. She stated the order for the refill of Armodafinil  had been received but not processed. Stated she was entering a note to have refill processed. Notified Pt w/this info.

## 2024-01-24 NOTE — Telephone Encounter (Signed)
 Pt reviewed results through MyChart. Last read by Arnoldo Bickers at 9:24PM on 01/23/2024.   Vincent Wilkerson, CMA

## 2024-01-29 ENCOUNTER — Encounter: Payer: Self-pay | Admitting: Family Medicine

## 2024-01-30 ENCOUNTER — Other Ambulatory Visit: Payer: Self-pay

## 2024-01-30 DIAGNOSIS — D72829 Elevated white blood cell count, unspecified: Secondary | ICD-10-CM

## 2024-01-31 ENCOUNTER — Encounter: Payer: Self-pay | Admitting: Neurology

## 2024-02-04 ENCOUNTER — Encounter: Payer: Self-pay | Admitting: Family Medicine

## 2024-02-07 ENCOUNTER — Other Ambulatory Visit: Payer: Self-pay

## 2024-02-07 MED ORDER — ROSUVASTATIN CALCIUM 40 MG PO TABS: 40.0000 mg | ORAL_TABLET | Freq: Every day | ORAL | 3 refills | Status: AC

## 2024-02-20 ENCOUNTER — Inpatient Hospital Stay

## 2024-02-20 ENCOUNTER — Inpatient Hospital Stay: Attending: Oncology | Admitting: Oncology

## 2024-02-20 ENCOUNTER — Encounter: Payer: Self-pay | Admitting: Oncology

## 2024-02-20 VITALS — BP 131/82 | HR 78 | Temp 98.8°F | Resp 16 | Ht 70.0 in | Wt 216.5 lb

## 2024-02-20 DIAGNOSIS — D72829 Elevated white blood cell count, unspecified: Secondary | ICD-10-CM | POA: Insufficient documentation

## 2024-02-20 DIAGNOSIS — C9110 Chronic lymphocytic leukemia of B-cell type not having achieved remission: Secondary | ICD-10-CM | POA: Insufficient documentation

## 2024-02-20 LAB — CBC WITH DIFFERENTIAL (CANCER CENTER ONLY)
Abs Immature Granulocytes: 0 K/uL (ref 0.00–0.07)
Basophils Absolute: 0.2 K/uL — ABNORMAL HIGH (ref 0.0–0.1)
Basophils Relative: 1 %
Eosinophils Absolute: 0.2 K/uL (ref 0.0–0.5)
Eosinophils Relative: 1 %
HCT: 48.1 % (ref 39.0–52.0)
Hemoglobin: 17.1 g/dL — ABNORMAL HIGH (ref 13.0–17.0)
Lymphocytes Relative: 63 %
Lymphs Abs: 12.7 K/uL — ABNORMAL HIGH (ref 0.7–4.0)
MCH: 32.5 pg (ref 26.0–34.0)
MCHC: 35.6 g/dL (ref 30.0–36.0)
MCV: 91.4 fL (ref 80.0–100.0)
Monocytes Absolute: 0.8 K/uL (ref 0.1–1.0)
Monocytes Relative: 4 %
Neutro Abs: 6.2 K/uL (ref 1.7–7.7)
Neutrophils Relative %: 31 %
Platelet Count: 185 K/uL (ref 150–400)
RBC: 5.26 MIL/uL (ref 4.22–5.81)
RDW: 12.8 % (ref 11.5–15.5)
Smear Review: DECREASED
WBC Count: 20.1 K/uL — ABNORMAL HIGH (ref 4.0–10.5)
WBC Morphology: ABNORMAL
nRBC: 0 % (ref 0.0–0.2)

## 2024-02-20 LAB — CMP (CANCER CENTER ONLY)
ALT: 27 U/L (ref 0–44)
AST: 30 U/L (ref 15–41)
Albumin: 4.6 g/dL (ref 3.5–5.0)
Alkaline Phosphatase: 100 U/L (ref 38–126)
Anion gap: 11 (ref 5–15)
BUN: 23 mg/dL (ref 8–23)
CO2: 28 mmol/L (ref 22–32)
Calcium: 10 mg/dL (ref 8.9–10.3)
Chloride: 103 mmol/L (ref 98–111)
Creatinine: 1.02 mg/dL (ref 0.61–1.24)
GFR, Estimated: >60 mL/min (ref >60)
Glucose, Bld: 100 mg/dL — ABNORMAL HIGH (ref 70–99)
Potassium: 4.2 mmol/L (ref 3.5–5.1)
Sodium: 142 mmol/L (ref 135–145)
Total Bilirubin: 0.4 mg/dL (ref 0.0–1.2)
Total Protein: 6.6 g/dL (ref 6.5–8.1)

## 2024-02-20 LAB — SEDIMENTATION RATE: Sed Rate: 2 mm/h (ref 0–16)

## 2024-02-20 LAB — C-REACTIVE PROTEIN: CRP: 0.5 mg/dL (ref <1.0)

## 2024-02-20 LAB — LACTATE DEHYDROGENASE: LDH: 237 U/L — ABNORMAL HIGH (ref 98–192)

## 2024-02-20 NOTE — Assessment & Plan Note (Signed)
 White blood cell count elevated to 14,000 in February and increased to 17,000 on repeat testing in June 2025. 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 Current symptoms include night sweats and decreased appetite.   Staging suspected to be Rai stage 1 based on lymphadenopathy. CLL typically follows a mild course and often does not require treatment unless red flags are present, such as progressive anemia, thrombocytopenia, constitutional symptoms, significant symptomatic lymphadenopathy, or rapid lymphocyte count increase.  Labs today showed white count of 20,100 with absolute lymphocyte count of 4700.  Hemoglobin 17.1, MCV 91, platelet count 185,000.  CMP unremarkable.  LDH increased to 237.  ESR and CRP are within normal limits.  We did submit request for cytometry of peripheral blood.  Will follow-up on the results and determine further course of action.  - Monitor closely over the next few visits to determine if watchful waiting is appropriate.  - Arrange for a PET scan to establish a baseline if CLL is confirmed on flow cytometry.  - Plan for mutation testing and possibly a bone marrow biopsy if treatment is needed.  We will arrange for a phone call visit next week to go over the results and determine further course of action based on results.

## 2024-02-20 NOTE — Progress Notes (Signed)
 [facility]  HEMATOLOGY CLINIC CONSULTATION NOTE   PATIENT NAME: Vincent Wilkerson   MR#: [id number] DOB: [birth year]  DATE OF SERVICE: 02/20/2024   REFERRING PROVIDER  Katrinka Garnette KIDD, MD   Patient Care Team: Katrinka Garnette KIDD, MD as PCP - General (Family Medicine)   REASON FOR CONSULTATION/ CHIEF COMPLAINT:  Evaluation of leukocytosis  ASSESSMENT & PLAN:  Vincent Wilkerson is a 68 y.o. gentleman with a past medical history of hypertension, dyslipidemia, sleep apnea, arthritis, depression, was referred to our service for evaluation of leukocytosis..    Leukocytosis White blood cell count elevated to 14,000 in February and increased to 17,000 on repeat testing in June 2025. 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 Current symptoms include night sweats and decreased appetite.   Staging suspected to be Rai stage 1 based on lymphadenopathy. CLL typically follows a mild course and often does not require treatment unless red flags are present, such as progressive anemia, thrombocytopenia, constitutional symptoms, significant symptomatic lymphadenopathy, or rapid lymphocyte count increase.  Labs today showed white count of 20,100 with absolute lymphocyte count of 4700.  Hemoglobin 17.1, MCV 91, platelet count 185,000.  CMP unremarkable.  LDH increased to 237.  ESR and CRP are within normal limits.  We did submit request for cytometry of peripheral blood.  Will follow-up on the results and determine further course of action.  - Monitor closely over the next few visits to determine if watchful waiting is appropriate.  - Arrange for a PET scan to establish a baseline if CLL is confirmed on flow cytometry.  - Plan for mutation testing and possibly a bone marrow biopsy if treatment is needed.  We will arrange for a phone call visit next week to go over the results and determine  further course of action based on results.    I reviewed lab results and outside records for this visit and discussed relevant results with the patient. Diagnosis, plan of care and treatment options were also discussed in detail with the patient. Opportunity provided to ask questions and answers provided to his apparent satisfaction. Provided instructions to call our clinic with any problems, questions or concerns prior to return visit. I recommended to continue follow-up with PCP and sub-specialists. He verbalized understanding and agreed with the plan. No barriers to learning was detected.  Chinita Patten, MD  02/20/2024 7:06 PM  [facility] - A DEPT OF [facility] [address] [place] [address] Dept: [phone] Dept Fax: [phone]   HISTORY OF PRESENT ILLNESS:   Discussed the use of AI scribe software for clinical note transcription with the patient, who gave verbal consent to proceed.  History of Present Illness Vincent Wilkerson is a 68 year old male who presents with elevated white blood cell count. He was referred by his primary doctor for evaluation of high white blood cell count.  His elevated white blood cell count was first noted during a physical examination at the end of February. A subsequent interim blood test showed an increase in the white blood cell count from 14,000 to 17,000. His red and platelet counts remain normal.  He has experienced lymphadenopathy since around the time of his physical examination, which coincided with a cold. The lymph nodes have not reduced in size since then and were initially erythematous and irritated. He describes the swelling as  bothersome but not currently severe.  He denies any recent changes in medication. He is a 'hot sleeper' and acknowledges experiencing more night sweats than initially admitted, requiring special cooling blankets for sleep. He notes a slight  unintentional weight loss and a decrease in appetite, although his appetite is generally normal.  No fevers, chills, or significant weight loss, but confirms the presence of night sweats and a slight decrease in appetite.   He denies fever, cough, diarrhea, or other infectious symptoms.  He denies epistaxis, bloody stool, melena, hematuria, bruising or other bleeding symptoms.   MEDICAL HISTORY Past Medical History:  Diagnosis Date   Arthritis    Left Big Toe    Depression    Hiccups    in the past/  Took Haloperidol to help   Hyperlipidemia    Hypertension    Pneumonia    as a baby   Sleep apnea      SURGICAL HISTORY Past Surgical History:  Procedure Laterality Date   CATARACT EXTRACTION Bilateral    COLONOSCOPY     MELANOMA EXCISION     Back     SOCIAL HISTORY: He reports that he has never smoked. He has never used smokeless tobacco. He reports that he does not currently use alcohol. He reports that he does not use drugs. Social History   Socioeconomic History   Marital status: Married    Spouse name: Not on file   Number of children: Not on file   Years of education: Not on file   Highest education level: Not on file  Occupational History   Not on file  Tobacco Use   Smoking status: Never   Smokeless tobacco: Never  Vaping Use   Vaping status: Not on file  Substance and Sexual Activity   Alcohol use: Not Currently    Comment: rare   Drug use: No   Sexual activity: Not on file  Other Topics Concern   Not on file  Social History Narrative   Right handed   3-4 cups caffeine per day (coffee)      Married 2001. 2 stepchildren. Both grown and married  In mid to late 20s. 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, walking dog, hiking, would love to do more woodwork- wants to get shop up and running in 2024   Social Drivers of Health   Financial Resource Strain: Not on file  Food Insecurity: No Food  Insecurity (02/20/2024)   Hunger Vital Sign    Worried About Running Out of Food in the Last Year: Never true    Ran Out of Food in the Last Year: Never true  Transportation Needs: No Transportation Needs (02/20/2024)   PRAPARE - Administrator, Civil Service (Medical): No    Lack of Transportation (Non-Medical): No  Physical Activity: Not on file  Stress: Not on file  Social Connections: Not on file  Intimate Partner Violence: Not At Risk (02/20/2024)   Humiliation, Afraid, Rape, and Kick questionnaire    Fear of Current or Ex-Partner: No    Emotionally Abused: No    Physically Abused: No    Sexually Abused: No    FAMILY HISTORY: His family history includes Alcohol abuse in his mother and sister; COPD in his mother; Depression in his mother; Gallbladder disease in his father; Heart disease in his father; Polycythemia in his father; Sleep apnea in his father.  CURRENT MEDICATIONS   Current Outpatient Medications  Medication Instructions   Armodafinil  200 mg, Oral, Daily   hydrochlorothiazide  (MICROZIDE ) 12.5 mg, Oral, Daily   Multiple Vitamin (MULTIVITAMIN) tablet 1 tablet, Daily   Multiple Vitamins-Minerals (AIRBORNE PO) Daily   rosuvastatin  (CRESTOR ) 40 mg, Oral, Daily     ALLERGIES  He has no allergies on file.  REVIEW OF SYSTEMS:  Review of Systems - Oncology   Rest of the pertinent review of systems is unremarkable except as mentioned above in HPI.  PHYSICAL EXAMINATION:    Onc Performance Status - 02/20/24 1309       ECOG Perf Status   ECOG Perf Status Restricted in physically strenuous activity but ambulatory and able to carry out work of a light or sedentary nature, e.g., light house work, office work      KPS SCALE   KPS % SCORE Able to carry on normal activity, minor s/s of disease          Vitals:   02/20/24 1308  BP: 131/82  Pulse: 78  Resp: 16  Temp: 98.8 F (37.1 C)  SpO2: 98%   Filed Weights   02/20/24 1308  Weight: 216 lb 8 oz  (98.2 kg)    Physical Exam Constitutional:      General: He is not in acute distress.    Appearance: Normal appearance.  HENT:     Head: Normocephalic and atraumatic.  Eyes:     Conjunctiva/sclera: Conjunctivae normal.  Cardiovascular:     Rate and Rhythm: Normal rate and regular rhythm.     Heart sounds: Normal heart sounds.  Pulmonary:     Effort: Pulmonary effort is normal. No respiratory distress.     Breath sounds: Normal breath sounds.  Abdominal:     General: There is no distension.     Palpations: Abdomen is soft. There is no mass.  Lymphadenopathy:     Cervical: Cervical adenopathy (bilateral multiple, soft to firm LNs in multiple stations) present.  Neurological:     General: No focal deficit present.     Mental Status: He is alert and oriented to person, place, and time.  Psychiatric:        Mood and Affect: Mood normal.        Behavior: Behavior normal.      LABORATORY DATA:   I have reviewed the data as listed.  Results for orders placed or performed in visit on 02/20/24  Sedimentation rate  Result Value Ref Range   Sed Rate 2 0 - 16 mm/hr  C-reactive protein  Result Value Ref Range   CRP 0.5 <1.0 mg/dL  Lactate dehydrogenase  Result Value Ref Range   LDH 237 (H) 98 - 192 U/L  CMP (Cancer Center only)  Result Value Ref Range   Sodium 142 135 - 145 mmol/L   Potassium 4.2 3.5 - 5.1 mmol/L   Chloride 103 98 - 111 mmol/L   CO2 28 22 - 32 mmol/L   Glucose, Bld 100 (H) 70 - 99 mg/dL   BUN 23 8 - 23 mg/dL   Creatinine 8.97 9.38 - 1.24 mg/dL   Calcium  10.0 8.9 - 10.3 mg/dL   Total Protein 6.6 6.5 - 8.1 g/dL   Albumin 4.6 3.5 - 5.0 g/dL   AST 30 15 - 41 U/L   ALT 27 0 - 44 U/L   Alkaline Phosphatase 100 38 - 126 U/L   Total Bilirubin 0.4 0.0 - 1.2 mg/dL   GFR, Estimated >39 >39  mL/min   Anion gap 11 5 - 15  CBC with Differential (Cancer Center Only)  Result Value Ref Range   WBC Count 20.1 (H) 4.0 - 10.5 K/uL   RBC 5.26 4.22 - 5.81 MIL/uL    Hemoglobin 17.1 (H) 13.0 - 17.0 g/dL   HCT 51.8 60.9 - 47.9 %   MCV 91.4 80.0 - 100.0 fL   MCH 32.5 26.0 - 34.0 pg   MCHC 35.6 30.0 - 36.0 g/dL   RDW 87.1 88.4 - 84.4 %   Platelet Count 185 150 - 400 K/uL   nRBC 0.0 0.0 - 0.2 %   Neutrophils Relative % 31 %   Neutro Abs 6.2 1.7 - 7.7 K/uL   Lymphocytes Relative 63 %   Lymphs Abs 12.7 (H) 0.7 - 4.0 K/uL   Monocytes Relative 4 %   Monocytes Absolute 0.8 0.1 - 1.0 K/uL   Eosinophils Relative 1 %   Eosinophils Absolute 0.2 0.0 - 0.5 K/uL   Basophils Relative 1 %   Basophils Absolute 0.2 (H) 0.0 - 0.1 K/uL   WBC Morphology Abnormal lymphocytes present    RBC Morphology MORPHOLOGY UNREMARKABLE    Smear Review PLATELETS APPEAR DECREASED    Abs Immature Granulocytes 0.00 0.00 - 0.07 K/uL   Reactive, Benign Lymphocytes PRESENT      RADIOGRAPHIC STUDIES:  No pertinent imaging studies available to review.  Orders Placed This Encounter  Procedures   CBC with Differential (Cancer Center Only)    Standing Status:   Future    Number of Occurrences:   1    Expiration Date:   02/19/2025   CMP (Cancer Center only)    Standing Status:   Future    Number of Occurrences:   1    Expiration Date:   02/19/2025   Lactate dehydrogenase    Standing Status:   Future    Number of Occurrences:   1    Expiration Date:   02/19/2025   C-reactive protein    Standing Status:   Future    Number of Occurrences:   1    Expiration Date:   02/19/2025   Sedimentation rate    Standing Status:   Future    Number of Occurrences:   1    Expiration Date:   02/19/2025   Flow Cytometry, Peripheral Blood (Oncology)    Standing Status:   Future    Number of Occurrences:   1    Expiration Date:   02/19/2025   BCR-ABL1 FISH    Standing Status:   Future    Number of Occurrences:   1    Expiration Date:   02/19/2025    Future Appointments  Date Time Provider Department Center  02/27/2024  2:30 PM Autumn Millman, MD [facility] None  04/16/2024  8:30 AM [facility]  PHLEBOTOMIST [facility] None  04/16/2024  9:00 AM Roxsana Riding, Millman, MD [facility] None  05/02/2024  2:30 PM Whitfield Raisin, NP [facility] None  10/14/2024  8:20 AM Katrinka Garnette KIDD, MD [facility]    I spent a total of 60 minutes during this encounter with the patient including review of chart and various tests results, discussions about plan of care and coordination of care plan.  This document was completed utilizing speech recognition software. Grammatical errors, random word insertions, pronoun errors, and incomplete sentences are an occasional consequence of this system due to software limitations, ambient noise, and hardware issues. Any formal questions or concerns about the content, text or information contained  within the body of this dictation should be directly addressed to the provider for clarification.

## 2024-02-22 LAB — SURGICAL PATHOLOGY

## 2024-02-27 ENCOUNTER — Encounter: Payer: Self-pay | Admitting: Oncology

## 2024-02-27 ENCOUNTER — Encounter: Admitting: Oncology

## 2024-02-27 ENCOUNTER — Inpatient Hospital Stay: Admitting: Oncology

## 2024-02-27 ENCOUNTER — Other Ambulatory Visit

## 2024-02-27 DIAGNOSIS — C9110 Chronic lymphocytic leukemia of B-cell type not having achieved remission: Secondary | ICD-10-CM | POA: Diagnosis not present

## 2024-02-27 NOTE — Assessment & Plan Note (Addendum)
 White blood cell count elevated to 14,000 in February and increased to 17,000 on repeat testing in June 2025. 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    On his consultation with us  on 02/20/24, labs showed white count of 20,100 with absolute lymphocyte count of 12,700.  Hemoglobin 17.1, MCV 91, platelet count 185,000.  CMP unremarkable.  LDH increased to 237.  ESR and CRP were within normal limits.  On 02/20/2024 flow cytometry of peripheral blood showed abnormal clonal B-cell population comprising 60% of the lymphocytes.  The B cells were positive for CD5, CD19, CD20, CD38, CD200 and express lambda light chains.  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   Today I discussed results with the patient in detail.  Explained diagnosis, prognosis, plan of care, treatment options.  Reviewed NCCN guidelines.  Current symptoms include intermittent night sweats and decreased appetite.    Staging suspected to be Rai stage 1 based on lymphadenopathy. CLL typically follows a mild course and often does not require treatment unless red flags are present, such as progressive anemia, thrombocytopenia, constitutional symptoms, significant symptomatic lymphadenopathy, or rapid lymphocyte count increase.   Given presence of cervical lymphadenopathy on physical exam, we will proceed with PET scan for staging and to evaluate for any bulky lymphadenopathy.   - Monitor closely over the next few visits to determine if watchful waiting is appropriate.    - Plan for mutation testing and possibly a bone marrow biopsy if treatment is needed.  I will plan to see him around 03/25/2024 with PET scan results and repeat lab tests.

## 2024-02-27 NOTE — Progress Notes (Signed)
 [facility]  HEMATOLOGY-ONCOLOGY ELECTRONIC VISIT PROGRESS NOTE  PATIENT NAME: Vincent Wilkerson   MR#: [id number] DOB: 09-13-1955  DATE OF SERVICE: 02/27/2024  Patient Care Team: Vincent Garnette KIDD, MD as PCP - General (Family Medicine)  I connected with the patient via telephone conference and verified that I am speaking with the correct person using two identifiers. The patient's location is at home and I am providing care from the [facility].  I discussed the limitations, risks, security and privacy concerns of performing an evaluation and management service by e-visits and the availability of in person appointments. I also discussed with the patient that there may be a patient responsible charge related to this service. The patient expressed understanding and agreed to proceed.   ASSESSMENT & PLAN:   Vincent Wilkerson is a 68 y.o. gentleman with a past medical history of hypertension, dyslipidemia, sleep apnea, arthritis, depression, was referred to our service for evaluation of leukocytosis.   CLL (chronic lymphocytic leukemia) (HCC) White blood cell count elevated to 14,000 in February and increased to 17,000 on repeat testing in June 2025. 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    On his consultation with us  on 02/20/24, labs showed white count of 20,100 with absolute lymphocyte count of 12,700.  Hemoglobin 17.1, MCV 91, platelet count 185,000.  CMP unremarkable.  LDH increased to 237.  ESR and CRP were within normal limits.  On 02/20/2024 flow cytometry of peripheral blood showed abnormal clonal B-cell population comprising 60% of the lymphocytes.  The B cells were positive for CD5, CD19, CD20, CD38, CD200 and express lambda light chains.  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   Today I discussed results with the patient in detail.  Explained diagnosis, prognosis, plan of care, treatment options.  Reviewed NCCN  guidelines.  Current symptoms include intermittent night sweats and decreased appetite.    Staging suspected to be Rai stage 1 based on lymphadenopathy. CLL typically follows a mild course and often does not require treatment unless red flags are present, such as progressive anemia, thrombocytopenia, constitutional symptoms, significant symptomatic lymphadenopathy, or rapid lymphocyte count increase.   Given presence of cervical lymphadenopathy on physical exam, we will proceed with PET scan for staging and to evaluate for any bulky lymphadenopathy.   - Monitor closely over the next few visits to determine if watchful waiting is appropriate.    - Plan for mutation testing and possibly a bone marrow biopsy if treatment is needed.  I will plan to see him around 03/25/2024 with PET scan results and repeat lab tests.   I discussed the assessment and treatment plan with the patient. The patient was provided an opportunity to ask questions and all were answered. The patient agreed with the plan and demonstrated an understanding of the instructions. The patient was advised to call back or seek an in-person evaluation if the symptoms worsen or if the condition fails to improve as anticipated.    I spent 21 minutes over the phone with the patient reviewing test results, discuss management and coordination/planning of care.  Chinita Patten, MD 02/27/2024 2:54 PM [name] DE[facility] OF [facility] [address] [place] [address] Dept: [phone] Dept Fax: [phone]   INTERVAL HISTORY:  Please see above for problem oriented charting.  The purpose of today's discussion is to explain recent lab results and to formulate plan of care.  Discussed the use of AI scribe software for clinical note transcription with the patient, who gave verbal consent to proceed.  History of Present Illness Vincent Wilkerson is a 68 year old  male who presents with elevated white blood cell count and suspected chronic lymphocytic leukemia (CLL). He was referred for evaluation of elevated white blood cell count.  He has been experiencing elevated white blood cell counts, initially noted at 17,000 and 14,000, and most recently measured at 20,000. His lymphocyte count was 12,700, which is elevated from a previous count of 8,000. No significant changes in his condition since the last visit.  Flow cytometry indicated that 60% of his lymphocytes were abnormal. He has noted neck lymph node enlargement.   His hemoglobin level is 17.1, which is within normal limits, and his platelet count is 185,000, also normal. Kidney and liver function tests, as well as electrolytes, are normal. Inflammation markers such as CRP and sed rate are normal.  In his family history, there is no known history of cancer. He recalls an incident from his youth involving a sunburn and a cut that later developed into a basal cell carcinoma.    SUMMARY OF ONCOLOGIC HISTORY:  He was referred by his primary doctor for evaluation of high white blood cell count.   His elevated white blood cell count was first noted during a physical examination at the end of February. A subsequent interim blood test showed an increase in the white blood cell count from 14,000 to 17,000. His red and platelet counts remain normal.   He has experienced lymphadenopathy since around the time of his physical examination, which coincided with a cold. The lymph nodes have not reduced in size since then and were initially erythematous and irritated. He describes the swelling as bothersome but not currently severe.   He denies any recent changes in medication. He is a 'hot sleeper' and acknowledges experiencing more night sweats than initially admitted, requiring special cooling blankets for sleep. He notes a slight unintentional weight loss and a decrease in appetite, although his appetite is  generally normal.   No fevers, chills, or significant weight loss, but confirms the presence of night sweats and a slight decrease in appetite.  White blood cell count elevated to 14,000 in February and increased to 17,000 on repeat testing in June 2025. 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    On his consultation with us  on 02/20/24, labs showed white count of 20,100 with absolute lymphocyte count of 12,700.  Hemoglobin 17.1, MCV 91, platelet count 185,000.  CMP unremarkable.  LDH increased to 237.  ESR and CRP were within normal limits.  On 02/20/2024 flow cytometry of peripheral blood showed abnormal clonal B-cell population comprising 60% of the lymphocytes.  The B cells were positive for CD5, CD19, CD20, CD38, CD200 and express lambda light chains.  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   Current symptoms include night sweats and decreased appetite.    Staging suspected to be Rai stage 1 based on lymphadenopathy. CLL typically follows a mild course and often does not require treatment unless red flags are present, such as progressive anemia, thrombocytopenia, constitutional symptoms, significant symptomatic lymphadenopathy, or rapid lymphocyte count increase.   Given presence of cervical lymphadenopathy on physical exam, we will proceed with PET scan for staging and to evaluate for any bulky lymphadenopathy.   - Monitor closely over the next few visits to determine if watchful waiting is appropriate.    -  Plan for mutation testing and possibly a bone marrow biopsy if treatment is needed.   REVIEW OF SYSTEMS:    Review of Systems - Oncology  All other pertinent systems were reviewed with the patient and are negative.  I have reviewed the past medical history, past surgical history, social history and family history with the patient and they are unchanged from previous note.  ALLERGIES:  He has no allergies on file.  MEDICATIONS:   Current Outpatient Medications  Medication Sig Dispense Refill   Armodafinil  200 MG TABS Take 1 tablet (200 mg total) by mouth daily. 90 tablet 1   hydrochlorothiazide  (MICROZIDE ) 12.5 MG capsule TAKE 1 CAPSULE BY MOUTH DAILY 90 capsule 3   Multiple Vitamin (MULTIVITAMIN) tablet Take 1 tablet by mouth daily. Nature Made for men-Take one daily     Multiple Vitamins-Minerals (AIRBORNE PO) Take by mouth daily.     rosuvastatin  (CRESTOR ) 40 MG tablet Take 1 tablet (40 mg total) by mouth daily. 90 tablet 3   No current facility-administered medications for this visit.    PHYSICAL EXAMINATION:   Onc Performance Status - 02/27/24 1400       ECOG Perf Status   ECOG Perf Status Restricted in physically strenuous activity but ambulatory and able to carry out work of a light or sedentary nature, e.g., light house work, office work      KPS SCALE   KPS % SCORE Able to carry on normal activity, minor s/s of disease          LABORATORY DATA:   I have reviewed the data as listed.  Recent Results (from the past 2160 hours)  CBC with Differential/Platelet     Status: Abnormal   Collection Time: 01/22/24  2:32 PM  Result Value Ref Range   WBC 17.4 (H) 4.0 - 10.5 K/uL   RBC 5.10 4.22 - 5.81 Mil/uL   Hemoglobin 16.4 13.0 - 17.0 g/dL   HCT 53.0 60.9 - 47.9 %   MCV 92.0 78.0 - 100.0 fl   MCHC 35.0 30.0 - 36.0 g/dL   RDW 86.8 88.4 - 84.4 %   Platelets 194.0 150.0 - 400.0 K/uL   Neutrophils Relative % 46.7 43.0 - 77.0 %   Lymphocytes Relative 45.1 12.0 - 46.0 %   Monocytes Relative 5.6 3.0 - 12.0 %   Eosinophils Relative 2.1 0.0 - 5.0 %   Basophils Relative 0.5 0.0 - 3.0 %   Neutro Abs 8.1 (H) 1.4 - 7.7 K/uL   Lymphs Abs 7.9 (H) 0.7 - 4.0 K/uL   Monocytes Absolute 1.0 0.1 - 1.0 K/uL   Eosinophils Absolute 0.4 0.0 - 0.7 K/uL   Basophils Absolute 0.1 0.0 - 0.1 K/uL  Surgical pathology     Status: None   Collection Time: 02/20/24 12:00 AM  Result Value Ref Range   SURGICAL PATHOLOGY       Surgical Pathology CASE: [id number] [phone] PATIENT: Guerin Wubben Flow Pathology Report     Clinical history: Leukocytosis, unspecified type     DIAGNOSIS:  - Abnormal clonal B-cell population identified.  See comment.  COMMENT: Flow cytometric analysis identified a clonal B-cell population constituting 60% of lymphocytes (1, 560 cells per microliter).  The B cells are positive for CD5, CD19, CD20, CD38, CD200 and express lambda light chains.  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  Correlation with potential sites of lymphadenopathy, splenomegaly and cytogenetics may be performed as clinically indicated.   GATING AND PHENOTYPIC ANALYSIS:  Gated population: Flow cytometric immunophenotyping is performed using antibodies to the antigens listed in the table below. Electronic gates are placed around a cell cluster displaying light scatter properties corresponding to: lymphocytes  Abnormal Cells in gat ed population: 60%  Phenotype of Abnormal Cells: CD5, CD19, CD20, CD200, Lambda                      Lymphoid Antigens       Myeloid Antigens Miscellaneous CD2  NEG  CD10 NEG  CD11b     ND   CD45 POS CD3  NEG  CD19 POS  CD11c     ND   HLA-Dr    ND CD4  NEG  CD20 POS  CD13 ND   CD34 NEG CD5  POS  CD22 ND   CD14 ND   CD38 NEG CD7  NEG  CD79b     ND   CD15 ND   CD138     ND CD8  NEG  CD103     ND   CD16 ND   TdT  ND CD25 ND   CD200     POS  CD33 ND   CD123     ND TCRab     ND   sKappa    NEG  CD64 ND   CD41 ND TCRgd     NEG  sLambda   POS  CD117     ND   CD61 ND CD56 NEG  cKappa    ND   MPO  ND   CD71 ND CD57 ND   cLambda   ND        [id number]      GROSS DESCRIPTION:  One lavender top tube submitted from [facility] for lymphoma testing.    Final Diagnosis performed by Ilsa Pottier, MD.   Electronically signed 02/22/2024 Technical and / or Professional components performed at [facility], [address] [place], [place] [address].   The above tests were developed and their performance characteristics determined by the [facility] system for the physical and immunophenotypic characterization of cell populations. They have not been cleared by the U.S. Food and Drug administration. The  FDA has determined that such clearance or approval is not necessary. This test is used for clinical purposes. It should not be  regarded as investigational or for research   Sedimentation rate     Status: None   Collection Time: 02/20/24  1:42 PM  Result Value Ref Range   Sed Rate 2 0 - 16 mm/hr    Comment: Performed at [facility], [address] [place], [place] [address]  C-reactive protein     Status: None   Collection Time: 02/20/24  1:42 PM  Result Value Ref Range   CRP 0.5 <1.0 mg/dL    Comment: Performed at [facility], [address], [place], [place] [address]  Lactate dehydrogenase     Status: Abnormal   Collection Time: 02/20/24  1:42 PM  Result Value Ref Range   LDH 237 (H) 98 - 192 U/L    Comment: HEMOLYSIS AT THIS LEVEL MAY AFFECT RESULT Performed at [facility], [address] [place], [place] [address]   CMP (Cancer Center only)     Status: Abnormal   Collection Time: 02/20/24  1:42 PM  Result Value Ref Range   Sodium 142 135 - 145 mmol/L   Potassium 4.2 3.5 - 5.1 mmol/L    Comment: HEMOLYSIS AT THIS  LEVEL MAY AFFECT RESULT   Chloride 103 98 - 111 mmol/L   CO2 28 22 - 32 mmol/L   Glucose, Bld 100 (H) 70 - 99 mg/dL    Comment: Glucose reference range applies only to samples taken after fasting for at least 8 hours.   BUN 23 8 - 23 mg/dL   Creatinine 8.97 9.38 - 1.24 mg/dL   Calcium  10.0 8.9 - 10.3 mg/dL   Total Protein 6.6 6.5 - 8.1 g/dL   Albumin 4.6 3.5 - 5.0 g/dL   AST 30 15 - 41 U/L   ALT 27 0 - 44 U/L   Alkaline Phosphatase 100 38 - 126 U/L   Total Bilirubin 0.4 0.0 - 1.2 mg/dL   GFR, Estimated  >39 >39 mL/min    Comment: (NOTE) Calculated using the CKD-EPI Creatinine Equation (2021)    Anion gap 11 5 - 15    Comment: Performed at [facility], [address], [place], [place] [address]  CBC with Differential (Cancer Center Only)     Status: Abnormal   Collection Time: 02/20/24  1:42 PM  Result Value Ref Range   WBC Count 20.1 (H) 4.0 - 10.5 K/uL   RBC 5.26 4.22 - 5.81 MIL/uL   Hemoglobin 17.1 (H) 13.0 - 17.0 g/dL   HCT 51.8 60.9 - 47.9 %   MCV 91.4 80.0 - 100.0 fL   MCH 32.5 26.0 - 34.0 pg   MCHC 35.6 30.0 - 36.0 g/dL   RDW 87.1 88.4 - 84.4 %   Platelet Count 185 150 - 400 K/uL   nRBC 0.0 0.0 - 0.2 %   Neutrophils Relative % 31 %   Neutro Abs 6.2 1.7 - 7.7 K/uL   Lymphocytes Relative 63 %   Lymphs Abs 12.7 (H) 0.7 - 4.0 K/uL   Monocytes Relative 4 %   Monocytes Absolute 0.8 0.1 - 1.0 K/uL   Eosinophils Relative 1 %   Eosinophils Absolute 0.2 0.0 - 0.5 K/uL   Basophils Relative 1 %   Basophils Absolute 0.2 (H) 0.0 - 0.1 K/uL   WBC Morphology Abnormal lymphocytes present    RBC Morphology MORPHOLOGY UNREMARKABLE    Smear Review PLATELETS APPEAR DECREASED    Abs Immature Granulocytes 0.00 0.00 - 0.07 K/uL   Reactive, Benign Lymphocytes PRESENT     Comment: Performed at [facility], [address], [place], [place] [address]     RADIOGRAPHIC STUDIES:  No recent pertinent imaging studies available to review.  Orders Placed This Encounter  Procedures   NM PET Image Initial (PI) Skull Base To Thigh    Standing Status:   Future    Expected Date:   03/12/2024    Expiration Date:   02/26/2025    If indicated for the ordered procedure, I authorize the administration of a radiopharmaceutical per Radiology protocol:   Yes    Preferred imaging location?:   [place]   CBC with Differential (Cancer Center Only)    Standing Status:   Future    Expected Date:   03/25/2024    Expiration Date:   06/23/2024   CMP (Cancer Center only)     Standing Status:   Future    Expected Date:   03/25/2024    Expiration Date:   06/23/2024   Lactate dehydrogenase    Standing Status:   Future    Expected Date:   03/25/2024    Expiration Date:   06/23/2024     Future Appointments  Date Time Provider Department Center  04/16/2024  8:30 AM [facility] PHLEBOTOMIST [facility] None  04/16/2024  9:00 AM Nichalas Coin, Chinita, MD [facility] None  05/02/2024  2:30 PM Whitfield Raisin, NP [facility] None  10/14/2024  8:20 AM Vincent Garnette KIDD, MD [facility]    This document was completed utilizing speech recognition software. Grammatical errors, random word insertions, pronoun errors, and incomplete sentences are an occasional consequence of this system due to software limitations, ambient noise, and hardware issues. Any formal questions or concerns about the content, text or information contained within the body of this dictation should be directly addressed to the provider for clarification.

## 2024-02-28 LAB — BCR-ABL1 FISH
Cells Analyzed: 200
Cells Counted: 200

## 2024-02-28 LAB — FLOW CYTOMETRY

## 2024-03-11 ENCOUNTER — Encounter (HOSPITAL_COMMUNITY)
Admission: RE | Admit: 2024-03-11 | Discharge: 2024-03-11 | Disposition: A | Discharge status: Home / Self Care | Source: Ambulatory Visit | Attending: Oncology | Admitting: Oncology

## 2024-03-11 DIAGNOSIS — C9110 Chronic lymphocytic leukemia of B-cell type not having achieved remission: Secondary | ICD-10-CM | POA: Insufficient documentation

## 2024-03-11 LAB — GLUCOSE, CAPILLARY: Glucose-Capillary: 91 mg/dL (ref 70–99)

## 2024-03-11 MED ORDER — FLUDEOXYGLUCOSE F - 18 (FDG) INJECTION
10.7000 | Freq: Once | INTRAVENOUS | Status: AC
  Administered 2024-03-11: 10.7 via INTRAVENOUS

## 2024-03-25 ENCOUNTER — Inpatient Hospital Stay: Attending: Oncology

## 2024-03-25 ENCOUNTER — Inpatient Hospital Stay (HOSPITAL_BASED_OUTPATIENT_CLINIC_OR_DEPARTMENT_OTHER): Admitting: Oncology

## 2024-03-25 VITALS — BP 129/73 | HR 67 | Temp 98.7°F | Resp 16 | Ht 70.0 in | Wt 212.9 lb

## 2024-03-25 DIAGNOSIS — I10 Essential (primary) hypertension: Secondary | ICD-10-CM | POA: Insufficient documentation

## 2024-03-25 DIAGNOSIS — Z79899 Other long term (current) drug therapy: Secondary | ICD-10-CM | POA: Diagnosis not present

## 2024-03-25 DIAGNOSIS — G4730 Sleep apnea, unspecified: Secondary | ICD-10-CM | POA: Diagnosis not present

## 2024-03-25 DIAGNOSIS — C9110 Chronic lymphocytic leukemia of B-cell type not having achieved remission: Secondary | ICD-10-CM | POA: Diagnosis not present

## 2024-03-25 DIAGNOSIS — I2510 Atherosclerotic heart disease of native coronary artery without angina pectoris: Secondary | ICD-10-CM | POA: Insufficient documentation

## 2024-03-25 DIAGNOSIS — E785 Hyperlipidemia, unspecified: Secondary | ICD-10-CM | POA: Diagnosis not present

## 2024-03-25 LAB — CMP (CANCER CENTER ONLY)
ALT: 25 U/L (ref 0–44)
AST: 26 U/L (ref 15–41)
Albumin: 4.4 g/dL (ref 3.5–5.0)
Alkaline Phosphatase: 86 U/L (ref 38–126)
Anion gap: 9 (ref 5–15)
BUN: 24 mg/dL — ABNORMAL HIGH (ref 8–23)
CO2: 27 mmol/L (ref 22–32)
Calcium: 10 mg/dL (ref 8.9–10.3)
Chloride: 104 mmol/L (ref 98–111)
Creatinine: 0.93 mg/dL (ref 0.61–1.24)
GFR, Estimated: >60 mL/min (ref >60)
Glucose, Bld: 95 mg/dL (ref 70–99)
Potassium: 4.3 mmol/L (ref 3.5–5.1)
Sodium: 140 mmol/L (ref 135–145)
Total Bilirubin: 0.4 mg/dL (ref 0.0–1.2)
Total Protein: 6.2 g/dL — ABNORMAL LOW (ref 6.5–8.1)

## 2024-03-25 LAB — CBC WITH DIFFERENTIAL (CANCER CENTER ONLY)
Abs Immature Granulocytes: 0.06 K/uL (ref 0.00–0.07)
Basophils Absolute: 0.1 K/uL (ref 0.0–0.1)
Basophils Relative: 1 %
Eosinophils Absolute: 0.2 K/uL (ref 0.0–0.5)
Eosinophils Relative: 2 %
HCT: 44.3 % (ref 39.0–52.0)
Hemoglobin: 16 g/dL (ref 13.0–17.0)
Immature Granulocytes: 0 %
Lymphocytes Relative: 49 %
Lymphs Abs: 7 K/uL — ABNORMAL HIGH (ref 0.7–4.0)
MCH: 33 pg (ref 26.0–34.0)
MCHC: 36.1 g/dL — ABNORMAL HIGH (ref 30.0–36.0)
MCV: 91.3 fL (ref 80.0–100.0)
Monocytes Absolute: 0.9 K/uL (ref 0.1–1.0)
Monocytes Relative: 6 %
Neutro Abs: 6 K/uL (ref 1.7–7.7)
Neutrophils Relative %: 42 %
Platelet Count: 165 K/uL (ref 150–400)
RBC: 4.85 MIL/uL (ref 4.22–5.81)
RDW: 12.9 % (ref 11.5–15.5)
WBC Count: 14.2 K/uL — ABNORMAL HIGH (ref 4.0–10.5)
nRBC: 0 % (ref 0.0–0.2)

## 2024-03-25 LAB — LACTATE DEHYDROGENASE: LDH: 197 U/L — ABNORMAL HIGH (ref 98–192)

## 2024-03-25 NOTE — Progress Notes (Signed)
 [facility]  ONCOLOGY CLINIC PROGRESS NOTE   Patient Care Team: Katrinka Garnette KIDD, MD as PCP - General (Family Medicine)  PATIENT NAME: Vincent Wilkerson   MR#: [id number] DOB: 1956-05-05  Date of visit: 03/25/2024   ASSESSMENT & PLAN:   Vincent Wilkerson is a 68 y.o. gentleman with a past medical history of hypertension, dyslipidemia, sleep apnea, arthritis, depression, was referred to our service in July 2025 for evaluation of leukocytosis.  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  CLL (chronic lymphocytic leukemia) (HCC) White blood cell count elevated to 14,000 in February and increased to 17,000 on repeat testing in June 2025. 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    On his consultation with us  on 02/20/24, labs showed white count of 20,100 with absolute lymphocyte count of 12,700.  Hemoglobin 17.1, MCV 91, platelet count 185,000.  CMP unremarkable.  LDH increased to 237.  ESR and CRP were within normal limits.  On 02/20/2024 flow cytometry of peripheral blood showed abnormal clonal B-cell population comprising 60% of the lymphocytes.  The B cells were positive for CD5, CD19, CD20, CD38, CD200 and express lambda light chains.  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   Today I discussed results with the patient in detail.  Explained diagnosis, prognosis, plan of care, treatment options.  Reviewed NCCN guidelines.  Current symptoms include intermittent night sweats and decreased appetite.    CLL typically follows a mild course and often does not require treatment unless red flags are present, such as progressive anemia, thrombocytopenia, constitutional symptoms, significant symptomatic lymphadenopathy, or rapid lymphocyte count increase.   On 03/11/2024, staging PET scan showed extensive borderline enlarged lymph nodes seen throughout the neck, chest, abdomen and pelvis, largest lymph node measuring 3 x 1.8 cm.  Splenomegaly  noted without evidence of FDG avidity.  CLL Rai stage II.  Labs today showed improved white count of 14,200, ALC stable at 7000.  Hemoglobin normal at 16, platelet count normal at 265,000.  CMP, LDH unremarkable. White blood cell count has decreased from 20,000 to 14,200, showing fluctuation typical of CLL.   - Monitor closely over the next few visits to determine if watchful waiting is appropriate.  Currently no constitutional symptoms or cytopenias that would warrant treatment initiation.    - Plan for mutation testing and possibly a bone marrow biopsy if treatment is needed.  - Schedule follow-up appointment in six weeks to assess trend and determine if treatment is needed.     I reviewed lab results and outside records for this visit and discussed relevant results with the patient. Diagnosis, plan of care and treatment options were also discussed in detail with the patient. Opportunity provided to ask questions and answers provided to his apparent satisfaction. Provided instructions to call our clinic with any problems, questions or concerns prior to return visit. I recommended to continue follow-up with PCP and sub-specialists. He verbalized understanding and agreed with the plan.   NCCN guidelines have been consulted in the planning of this patient's care.  I spent a total of 30 minutes during this encounter with the patient including review of chart and various tests results, discussions about plan of care and coordination of care plan.   Vincent Ghuman, MD   [facility] - A DEPT OF [facility] [address] [place] [address] Dept: [phone] Dept Fax: [phone]    CHIEF COMPLAINT/ REASON  FOR VISIT:   CLL, Rai stage II  Current Treatment: Active surveillance  INTERVAL HISTORY:    Discussed the use of AI scribe software for clinical note transcription with the patient, who gave verbal  consent to proceed.  History of Present Illness Vincent Wilkerson is a 68 year old male with chronic lymphocytic leukemia who presents with fatigue and night sweats. He is accompanied by his wife. He was referred by another doctor due to an elevated white blood cell count.  He has experienced increased fatigue, requiring a caffeinated beverage in the afternoon to avoid falling asleep at work. He feels more agitated, possibly due to fatigue. Despite maintaining a regular exercise routine, he has stopped mowing the lawn.  He experienced night sweats the night before the appointment, significant enough to require a change of clothes. He woke up at 2:30 AM, possibly due to nervousness about the appointment. No fevers or chills. No unintentional weight loss.  He has chronic lymphocytic leukemia and has undergone a PET scan showing lymphadenopathy with lymph nodes in the neck, chest, and abdomen measuring up to 3 cm. His spleen is slightly enlarged at 14.7 cm, but his liver appears normal. His white blood cell count has fluctuated, peaking at 20,000 and currently at 14,200, down from 17,000 in June. His hemoglobin and platelet counts remain normal.  His appetite has been irregular, particularly while his wife was away, and he notes a decrease in appetite compared to normal. He does not report any unintentional weight loss, attributing any changes to irregular eating habits during his wife's absence.  No difficulty swallowing or breathing.    I have reviewed the past medical history, past surgical history, social history and family history with the patient and they are unchanged from previous note.  HISTORY OF PRESENT ILLNESS:   ONCOLOGY HISTORY:   He was referred by his primary doctor for evaluation of high white blood cell count.   His elevated white blood cell count was first noted during a physical examination at the end of February. A subsequent interim blood test showed an increase in the white  blood cell count from 14,000 to 17,000. His red and platelet counts remain normal.   He has experienced lymphadenopathy since around the time of his physical examination, which coincided with a cold. The lymph nodes have not reduced in size since then and were initially erythematous and irritated. He describes the swelling as bothersome but not currently severe.   He denies any recent changes in medication. He is a 'hot sleeper' and acknowledges experiencing more night sweats than initially admitted, requiring special cooling blankets for sleep. He notes a slight unintentional weight loss and a decrease in appetite, although his appetite is generally normal.   No fevers, chills, or significant weight loss, but confirms the presence of night sweats and a slight decrease in appetite.   White blood cell count elevated to 14,000 in February and increased to 17,000 on repeat testing in June 2025. 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    On his consultation with us  on 02/20/24, labs showed white count of 20,100 with absolute lymphocyte count of 12,700.  Hemoglobin 17.1, MCV 91, platelet count 185,000.  CMP unremarkable.  LDH increased to 237.  ESR and CRP were within normal limits.   On 02/20/2024 flow cytometry of peripheral blood showed abnormal clonal B-cell population comprising 60% of the lymphocytes.  The B cells were positive for CD5, CD19, CD20, CD38, CD200 and express lambda  light chains.  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    Current symptoms include night sweats and decreased appetite.    Staging suspected to be Rai stage 1 based on lymphadenopathy. CLL typically follows a mild course and often does not require treatment unless red flags are present, such as progressive anemia, thrombocytopenia, constitutional symptoms, significant symptomatic lymphadenopathy, or rapid lymphocyte count increase.   Given presence of cervical lymphadenopathy  on physical exam, we proceeded with PET scan for staging and to evaluate for any bulky lymphadenopathy.  On 03/11/2024, staging PET scan showed extensive borderline enlarged lymph nodes seen throughout the neck, chest, abdomen and pelvis, largest lymph node measuring 3 x 1.8 cm.  Splenomegaly noted without evidence of FDG avidity.   - Monitor closely over the next few visits to determine if watchful waiting is appropriate.  Currently no constitutional symptoms or cytopenias that would warrant treatment initiation.    - Plan for mutation testing and possibly a bone marrow biopsy if treatment is needed.  Oncology History  CLL (chronic lymphocytic leukemia) (HCC)  02/20/2024 Initial Diagnosis   CLL (chronic lymphocytic leukemia) (HCC)   02/27/2024 Cancer Staging   Staging form: Chronic Lymphocytic Leukemia / Small Lymphocytic Lymphoma, AJCC 8th Edition - Clinical stage from 02/27/2024: Modified Rai Stage II (Modified Rai risk: Intermediate, Lymphocytosis: Present, Adenopathy: Present, Organomegaly: Present, Anemia: Absent, Thrombocytopenia: Absent) - Signed by Autumn Millman, MD on 03/29/2024 Stage prefix: Initial diagnosis       REVIEW OF SYSTEMS:   Review of Systems - Oncology  All other pertinent systems were reviewed with the patient and are negative.  ALLERGIES: He has no allergies on file.  MEDICATIONS:  Current Outpatient Medications  Medication Sig Dispense Refill   Armodafinil  200 MG TABS Take 1 tablet (200 mg total) by mouth daily. 90 tablet 1   hydrochlorothiazide  (MICROZIDE ) 12.5 MG capsule TAKE 1 CAPSULE BY MOUTH DAILY 90 capsule 3   Multiple Vitamin (MULTIVITAMIN) tablet Take 1 tablet by mouth daily. Nature Made for men-Take one daily     rosuvastatin  (CRESTOR ) 40 MG tablet Take 1 tablet (40 mg total) by mouth daily. 90 tablet 3   No current facility-administered medications for this visit.     VITALS:   Blood pressure 129/73, pulse 67, temperature 98.7 F (37.1 C),  temperature source Temporal, resp. rate 16, height 5' 10 (1.778 m), weight 212 lb 14.4 oz (96.6 kg), SpO2 98%.  Wt Readings from Last 3 Encounters:  03/25/24 212 lb 14.4 oz (96.6 kg)  02/20/24 216 lb 8 oz (98.2 kg)  10/19/23 219 lb (99.3 kg)    Body mass index is 30.55 kg/m.     Onc Performance Status - 03/29/24 1700       ECOG Perf Status   ECOG Perf Status Fully active, able to carry on all pre-disease performance without restriction      KPS SCALE   KPS % SCORE Normal, no compliants, no evidence of disease           PHYSICAL EXAM:   Physical Exam Constitutional:      General: He is not in acute distress.    Appearance: Normal appearance.  HENT:     Head: Normocephalic and atraumatic.  Eyes:     Conjunctiva/sclera: Conjunctivae normal.  Cardiovascular:     Rate and Rhythm: Normal rate and regular rhythm.  Pulmonary:     Effort: Pulmonary effort is normal. No respiratory distress.  Abdominal:     General: There is  no distension.  Lymphadenopathy:     Cervical: Cervical adenopathy ((bilateral multiple, soft to firm LNs in multiple stations) present.  Neurological:     General: No focal deficit present.     Mental Status: He is alert and oriented to person, place, and time.  Psychiatric:        Mood and Affect: Mood normal.        Behavior: Behavior normal.        LABORATORY DATA:   I have reviewed the data as listed.  Results for orders placed or performed in visit on 03/25/24  Lactate dehydrogenase  Result Value Ref Range   LDH 197 (H) 98 - 192 U/L  CMP (Cancer Center only)  Result Value Ref Range   Sodium 140 135 - 145 mmol/L   Potassium 4.3 3.5 - 5.1 mmol/L   Chloride 104 98 - 111 mmol/L   CO2 27 22 - 32 mmol/L   Glucose, Bld 95 70 - 99 mg/dL   BUN 24 (H) 8 - 23 mg/dL   Creatinine 9.06 9.38 - 1.24 mg/dL   Calcium  10.0 8.9 - 10.3 mg/dL   Total Protein 6.2 (L) 6.5 - 8.1 g/dL   Albumin 4.4 3.5 - 5.0 g/dL   AST 26 15 - 41 U/L   ALT 25 0 - 44  U/L   Alkaline Phosphatase 86 38 - 126 U/L   Total Bilirubin 0.4 0.0 - 1.2 mg/dL   GFR, Estimated >39 >39 mL/min   Anion gap 9 5 - 15  CBC with Differential (Cancer Center Only)  Result Value Ref Range   WBC Count 14.2 (H) 4.0 - 10.5 K/uL   RBC 4.85 4.22 - 5.81 MIL/uL   Hemoglobin 16.0 13.0 - 17.0 g/dL   HCT 55.6 60.9 - 47.9 %   MCV 91.3 80.0 - 100.0 fL   MCH 33.0 26.0 - 34.0 pg   MCHC 36.1 (H) 30.0 - 36.0 g/dL   RDW 87.0 88.4 - 84.4 %   Platelet Count 165 150 - 400 K/uL   nRBC 0.0 0.0 - 0.2 %   Neutrophils Relative % 42 %   Neutro Abs 6.0 1.7 - 7.7 K/uL   Lymphocytes Relative 49 %   Lymphs Abs 7.0 (H) 0.7 - 4.0 K/uL   Monocytes Relative 6 %   Monocytes Absolute 0.9 0.1 - 1.0 K/uL   Eosinophils Relative 2 %   Eosinophils Absolute 0.2 0.0 - 0.5 K/uL   Basophils Relative 1 %   Basophils Absolute 0.1 0.0 - 0.1 K/uL   Immature Granulocytes 0 %   Abs Immature Granulocytes 0.06 0.00 - 0.07 K/uL   Abnormal Lymphocytes Present PRESENT       RADIOGRAPHIC STUDIES:  I have personally reviewed the radiological images as listed and agree with the findings in the report.  NM PET Image Initial (PI) Skull Base To Thigh Result Date: 03/11/2024 CLINICAL DATA:  Initial treatment strategy for CLL. EXAM: NUCLEAR MEDICINE PET SKULL BASE TO THIGH TECHNIQUE: 10.63 mCi F-18 FDG was injected intravenously. Full-ring PET imaging was performed from the skull base to thigh after the radiotracer. CT data was obtained and used for attenuation correction and anatomic localization. Fasting blood glucose: 91 mg/dl COMPARISON:  None Available. FINDINGS: Mediastinal blood pool activity: SUV max 2.1 Liver activity: SUV max 2.7 NECK: There are numerous borderline and mildly enlarged lymph nodes throughout the neck including posterior triangle, sub mandibular and internal jugular regions. Many of these nodes are less than a cm in  short axis but are much more numerous than usually seen. These have only low-level  uptake. Example node left submandibular region has maximum SUV of 3.1 and dimension on CT image 42 1.6 by 2.1 cm. The example on the right side deep to the sternocleidomastoid muscle knee upper neck on image 33 of series 4 measures 10 by 13 mm and has maximum SUV of 1.8. Near symmetric uptake of the visualized intracranial compartment. Incidental CT findings: The thyroid  gland, submandibular glands and the parotid glands are grossly preserved. Rounded opacity along the inferior aspect of the left maxillary sinus is without abnormal uptake and could be a small mucous retention cyst. Is also some opacity along the left anterior ethmoid air cells without abnormal uptake. Otherwise the mastoid air cells are clear. There is some streak artifact related to the patient's dental hardware. CHEST: Once again there are several areas of mild lymph node enlargement and uptake throughout the thorax. Most profound along the axillary regions greater than mediastinum and hila. Again many of these nodes are less than a cm in short axis but more prominent and numerous than usually seen. Example node on the right side has maximum SUV of 1.8 in this axillary node on image 64 measures 18 by 15 mm. Example node on the left axilla has maximum SUV of 2.2 and dimension on image 77 of 1.9 by 1.3 cm. Mediastinal nodes also do not show significant uptake. No areas of abnormal lung uptake. Incidental CT findings: Heart is nonenlarged. Mild coronary artery calcifications are seen. Please correlate for other coronary risk factors. There is some pulsation artifact identified. The thoracic aorta has a normal course and caliber. Normal caliber thoracic esophagus. Azygous fissure. Breathing motion. Calcified nodule lingula image 5 distal old granulomatous disease. Mild basilar atelectasis. ABDOMEN/PELVIS: Spleen is enlarged with a launch in length of 14.7 cm. However no abnormal splenic uptake. In addition like the neck and chest there are several  areas of nodal enlargement scattered throughout the abdomen pelvis including retroperitoneum, mesentery, iliac chain, pelvic sidewall and inguinal regions. And like other areas, again several are cm size and are less in short axis but more numerous than usually seen. Few more larger nodes are identified as well. Example includes portacaval node which has maximum SUV of 2.5. This node on image 118 of the CT measures 2.5 x 1.9 cm. Left periaortic node in the low retroperitoneum on image 156 measures 2.6 x 1.3 cm. This node has maximum SUV of 2.0. Large nodes are seen in the pelvis. Example right external iliac chain has maximum SUV of 1.9 but node measures 3.3 by 1.8 cm on series 4, image 184. Left-sided node on image 192 as an example measures 3.0 by 1.8 cm and has maximum SUV of 1.6. There is physiologic distribution radiotracer otherwise along the parenchymal organs, bowel and renal collecting systems. Incidental CT findings: Grossly, the adrenal glands and pancreas are unremarkable. Gallbladder is present. Tiny low-attenuation segment 4 liver lesion identified image 105 of series 4, too small to completely characterize but again has no abnormal uptake and could be a benign cystic lesion. No renal or ureteral stones identified. Preserved contour to the urinary bladder. Enlarged prostate identified but no abnormal uptake at this time. Please correlate with history 2.5. There is 1 node measured in the neck at maximum SUV value of. Diffuse colonic stool. Few sigmoid colon diverticula. Normal appendix with some high attenuation debris. Stomach and small bowel are nondilated. Mild scattered vascular calcifications. Normal caliber  aorta and IVC. SKELETON: No focal hypermetabolic activity to suggest skeletal metastasis. Incidental CT findings: Diffuse scattered degenerative changes identified. Mild curvature of the spine. IMPRESSION: Extensive borderline enlarged lymph nodes seen throughout the neck, chest, abdomen and  pelvis. Level uptake however is relatively low with majority areas have thing maximum SUV value of less than 2.5 maximum SUV except for 1 node in low left neck having more intense uptake with maximum SUV value of 3.1. With this 1 node of more intense uptake Deauville category 4. Most areas are Deauville category 2 and 3. Spleen is mildly enlarged but no abnormal splenic uptake. Coronary artery calcifications. Please correlate for other coronary risk factors. Enlarged prostate. Colonic diverticula. Electronically Signed   By: Ranell Bring M.D.   On: 03/11/2024 16:52       Future Appointments  Date Time Provider Department Center  05/02/2024  2:30 PM Whitfield Raisin, NP [facility] None  05/06/2024  8:30 AM [facility] PHLEBOTOMIST [facility] None  05/06/2024  9:00 AM Malon Branton, Chinita, MD [facility] None  10/14/2024  8:20 AM Katrinka Garnette KIDD, MD [facility]      This document was completed utilizing speech recognition software. Grammatical errors, random word insertions, pronoun errors, and incomplete sentences are an occasional consequence of this system due to software limitations, ambient noise, and hardware issues. Any formal questions or concerns about the content, text or information contained within the body of this dictation should be directly addressed to the provider for clarification.

## 2024-03-29 ENCOUNTER — Encounter: Payer: Self-pay | Admitting: Oncology

## 2024-03-29 NOTE — Assessment & Plan Note (Addendum)
 White blood cell count elevated to 14,000 in February and increased to 17,000 on repeat testing in June 2025. 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    On his consultation with us  on 02/20/24, labs showed white count of 20,100 with absolute lymphocyte count of 12,700.  Hemoglobin 17.1, MCV 91, platelet count 185,000.  CMP unremarkable.  LDH increased to 237.  ESR and CRP were within normal limits.  On 02/20/2024 flow cytometry of peripheral blood showed abnormal clonal B-cell population comprising 60% of the lymphocytes.  The B cells were positive for CD5, CD19, CD20, CD38, CD200 and express lambda light chains.  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   Today I discussed results with the patient in detail.  Explained diagnosis, prognosis, plan of care, treatment options.  Reviewed NCCN guidelines.  Current symptoms include intermittent night sweats and decreased appetite.    CLL typically follows a mild course and often does not require treatment unless red flags are present, such as progressive anemia, thrombocytopenia, constitutional symptoms, significant symptomatic lymphadenopathy, or rapid lymphocyte count increase.   On 03/11/2024, staging PET scan showed extensive borderline enlarged lymph nodes seen throughout the neck, chest, abdomen and pelvis, largest lymph node measuring 3 x 1.8 cm.  Splenomegaly noted without evidence of FDG avidity.  CLL Rai stage II.  Labs today showed improved white count of 14,200, ALC stable at 7000.  Hemoglobin normal at 16, platelet count normal at 265,000.  CMP, LDH unremarkable. White blood cell count has decreased from 20,000 to 14,200, showing fluctuation typical of CLL.   - Monitor closely over the next few visits to determine if watchful waiting is appropriate.  Currently no constitutional symptoms or cytopenias that would warrant treatment initiation.    - Plan for mutation testing and possibly a bone  marrow biopsy if treatment is needed.  - Schedule follow-up appointment in six weeks to assess trend and determine if treatment is needed.

## 2024-04-15 ENCOUNTER — Encounter: Payer: Self-pay | Admitting: Neurology

## 2024-04-16 ENCOUNTER — Ambulatory Visit: Admitting: Oncology

## 2024-04-16 ENCOUNTER — Other Ambulatory Visit

## 2024-05-01 NOTE — Progress Notes (Unsigned)
 SABRA

## 2024-05-02 ENCOUNTER — Encounter: Payer: Self-pay | Admitting: Adult Health

## 2024-05-02 ENCOUNTER — Other Ambulatory Visit: Payer: Self-pay | Admitting: Oncology

## 2024-05-02 ENCOUNTER — Telehealth: Admitting: Adult Health

## 2024-05-02 DIAGNOSIS — C9110 Chronic lymphocytic leukemia of B-cell type not having achieved remission: Secondary | ICD-10-CM | POA: Diagnosis not present

## 2024-05-02 DIAGNOSIS — G4733 Obstructive sleep apnea (adult) (pediatric): Secondary | ICD-10-CM | POA: Diagnosis not present

## 2024-05-02 DIAGNOSIS — G4710 Hypersomnia, unspecified: Secondary | ICD-10-CM

## 2024-05-02 DIAGNOSIS — G4719 Other hypersomnia: Secondary | ICD-10-CM

## 2024-05-02 DIAGNOSIS — G478 Other sleep disorders: Secondary | ICD-10-CM

## 2024-05-02 NOTE — Patient Instructions (Addendum)
 Your Plan:  Continue nightly use of CPAP with ensuring greater than 4 hours per night   Continue Nuvigil  daily to help with daytime fatigue  Follow up with your oncologist regarding sleep concerns       Follow up with Dr. Chalice in 6 months or call earlier if needed      Thank you for coming to see us  at [facility]. I hope we have been able to provide you high quality care today.  You may receive a patient satisfaction survey over the next few weeks. We would appreciate your feedback and comments so that we may continue to improve ourselves and the health of our patients.

## 2024-05-03 ENCOUNTER — Telehealth: Payer: Self-pay | Admitting: Adult Health

## 2024-05-03 NOTE — Telephone Encounter (Signed)
-----   Message from Harlene Bogaert sent at 05/02/2024  2:40 PM EDT ----- Can patient be contacted to schedule f/u visit with Dr. Chalice in 6 months? Patient is okay with sending mychart message to set up appointment unless it is easier to be called. Thank you!

## 2024-05-06 ENCOUNTER — Inpatient Hospital Stay: Attending: Oncology

## 2024-05-06 ENCOUNTER — Encounter: Payer: Self-pay | Admitting: Oncology

## 2024-05-06 ENCOUNTER — Inpatient Hospital Stay (HOSPITAL_BASED_OUTPATIENT_CLINIC_OR_DEPARTMENT_OTHER): Admitting: Oncology

## 2024-05-06 VITALS — BP 139/71 | HR 73 | Temp 98.1°F | Resp 16 | Ht 70.0 in | Wt 219.6 lb

## 2024-05-06 DIAGNOSIS — C9110 Chronic lymphocytic leukemia of B-cell type not having achieved remission: Secondary | ICD-10-CM

## 2024-05-06 DIAGNOSIS — I10 Essential (primary) hypertension: Secondary | ICD-10-CM | POA: Insufficient documentation

## 2024-05-06 DIAGNOSIS — E785 Hyperlipidemia, unspecified: Secondary | ICD-10-CM | POA: Insufficient documentation

## 2024-05-06 DIAGNOSIS — D72829 Elevated white blood cell count, unspecified: Secondary | ICD-10-CM | POA: Diagnosis not present

## 2024-05-06 DIAGNOSIS — G4730 Sleep apnea, unspecified: Secondary | ICD-10-CM | POA: Insufficient documentation

## 2024-05-06 DIAGNOSIS — G4700 Insomnia, unspecified: Secondary | ICD-10-CM | POA: Insufficient documentation

## 2024-05-06 DIAGNOSIS — Z79899 Other long term (current) drug therapy: Secondary | ICD-10-CM | POA: Insufficient documentation

## 2024-05-06 DIAGNOSIS — M1990 Unspecified osteoarthritis, unspecified site: Secondary | ICD-10-CM | POA: Diagnosis not present

## 2024-05-06 DIAGNOSIS — N400 Enlarged prostate without lower urinary tract symptoms: Secondary | ICD-10-CM | POA: Diagnosis not present

## 2024-05-06 LAB — CBC WITH DIFFERENTIAL (CANCER CENTER ONLY)
Abs Immature Granulocytes: 0.13 K/uL — ABNORMAL HIGH (ref 0.00–0.07)
Basophils Absolute: 0.1 K/uL (ref 0.0–0.1)
Basophils Relative: 1 %
Eosinophils Absolute: 0.3 K/uL (ref 0.0–0.5)
Eosinophils Relative: 2 %
HCT: 46.3 % (ref 39.0–52.0)
Hemoglobin: 16.2 g/dL (ref 13.0–17.0)
Immature Granulocytes: 1 %
Lymphocytes Relative: 48 %
Lymphs Abs: 8.2 K/uL — ABNORMAL HIGH (ref 0.7–4.0)
MCH: 32.5 pg (ref 26.0–34.0)
MCHC: 35 g/dL (ref 30.0–36.0)
MCV: 92.8 fL (ref 80.0–100.0)
Monocytes Absolute: 1.1 K/uL — ABNORMAL HIGH (ref 0.1–1.0)
Monocytes Relative: 7 %
Neutro Abs: 6.8 K/uL (ref 1.7–7.7)
Neutrophils Relative %: 41 %
Platelet Count: 175 K/uL (ref 150–400)
RBC: 4.99 MIL/uL (ref 4.22–5.81)
RDW: 12.8 % (ref 11.5–15.5)
WBC Count: 16.7 K/uL — ABNORMAL HIGH (ref 4.0–10.5)
nRBC: 0 % (ref 0.0–0.2)

## 2024-05-06 LAB — CMP (CANCER CENTER ONLY)
ALT: 23 U/L (ref 0–44)
AST: 27 U/L (ref 15–41)
Albumin: 4.7 g/dL (ref 3.5–5.0)
Alkaline Phosphatase: 101 U/L (ref 38–126)
Anion gap: 9 (ref 5–15)
BUN: 20 mg/dL (ref 8–23)
CO2: 26 mmol/L (ref 22–32)
Calcium: 10.2 mg/dL (ref 8.9–10.3)
Chloride: 106 mmol/L (ref 98–111)
Creatinine: 0.94 mg/dL (ref 0.61–1.24)
GFR, Estimated: >60 mL/min (ref >60)
Glucose, Bld: 85 mg/dL (ref 70–99)
Potassium: 4.3 mmol/L (ref 3.5–5.1)
Sodium: 142 mmol/L (ref 135–145)
Total Bilirubin: 0.4 mg/dL (ref 0.0–1.2)
Total Protein: 6.4 g/dL — ABNORMAL LOW (ref 6.5–8.1)

## 2024-05-06 LAB — LACTATE DEHYDROGENASE: LDH: 234 U/L — ABNORMAL HIGH (ref 98–192)

## 2024-05-06 NOTE — Assessment & Plan Note (Signed)
 Insomnia characterized by difficulty staying asleep, with frequent awakenings around 2:30 to 3:30 AM. He reports feeling fatigued and not getting restorative sleep. He exercises regularly and has tried melatonin without success. He is cautious about using sleep aids due to potential side effects like morning grogginess. He is considering trying Z-Quil at half the recommended dose over a weekend to assess its effects. If ineffective, prescription medications like trazodone  may be considered, though they are not preferred due to potential side effects and habit-forming nature. - Try Z-Quil at half the recommended dose over a weekend. - If Z-Quil is ineffective, consider prescription medications like trazodone . - Avoid caffeine late in the day.

## 2024-05-06 NOTE — Progress Notes (Signed)
 [facility]  ONCOLOGY CLINIC PROGRESS NOTE   Patient Care Team: Vincent Garnette KIDD, MD as PCP - General (Family Medicine)  PATIENT NAME: Vincent Wilkerson   MR#: [id number] DOB: [birth year]  Date of visit: 05/06/2024   ASSESSMENT & PLAN:   Vincent Wilkerson is a 67 y.o. gentleman with a past medical history of hypertension, dyslipidemia, sleep apnea, arthritis, depression, was referred to our service in July 2025 for evaluation of leukocytosis.  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  CLL (chronic lymphocytic leukemia) (HCC) White blood cell count elevated to 14,000 in February and increased to 17,000 on repeat testing in June 2025. 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    On his consultation with us  on 02/20/24, labs showed white count of 20,100 with absolute lymphocyte count of 12,700.  Hemoglobin 17.1, MCV 91, platelet count 185,000.  CMP unremarkable.  LDH increased to 237.  ESR and CRP were within normal limits.  On 02/20/2024 flow cytometry of peripheral blood showed abnormal clonal B-cell population comprising 60% of the lymphocytes.  The B cells were positive for CD5, CD19, CD20, CD38, CD200 and express lambda light chains.  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   Previously I discussed results with the patient in detail.  Explained diagnosis, prognosis, plan of care, treatment options.  Reviewed NCCN guidelines.  Current symptoms include intermittent night sweats.   CLL typically follows a mild course and often does not require treatment unless red flags are present, such as progressive anemia, thrombocytopenia, constitutional symptoms, significant symptomatic lymphadenopathy, or rapid lymphocyte count increase.   On 03/11/2024, staging PET scan showed extensive borderline enlarged lymph nodes seen throughout the neck, chest, abdomen and pelvis, largest lymph node measuring 3 x 1.8 cm.  Splenomegaly noted without  evidence of FDG avidity.  CLL Rai stage II.  Labs today showed overall stable white count at 16,700, ALC of 8200.  Hemoglobin normal at 16.2, platelet count normal at 175,000.  CMP unremarkable.  LDH 234, fluctuating.    - Monitor closely over the next few visits to determine if watchful waiting is appropriate.  Currently no constitutional symptoms or cytopenias that would warrant treatment initiation.    - Plan for mutation testing and possibly a bone marrow biopsy if treatment is needed.  - Schedule follow-up appointment in 2 months to assess trend and determine if treatment is needed.  Insomnia Insomnia characterized by difficulty staying asleep, with frequent awakenings around 2:30 to 3:30 AM. He reports feeling fatigued and not getting restorative sleep. He exercises regularly and has tried melatonin without success. He is cautious about using sleep aids due to potential side effects like morning grogginess. He is considering trying Z-Quil at half the recommended dose over a weekend to assess its effects. If ineffective, prescription medications like trazodone  may be considered, though they are not preferred due to potential side effects and habit-forming nature. - Try Z-Quil at half the recommended dose over a weekend. - If Z-Quil is ineffective, consider prescription medications like trazodone . - Avoid caffeine late in the day.   I reviewed lab results and outside records for this visit and discussed relevant results with the patient. Diagnosis, plan of care and treatment options were also discussed in detail with the patient. Opportunity provided to ask questions and answers provided to his apparent satisfaction. Provided instructions to call our clinic with any problems, questions or concerns prior to return visit. I recommended to continue follow-up with  PCP and sub-specialists. He verbalized understanding and agreed with the plan.   NCCN guidelines have been consulted in the planning  of this patient's care.  I spent a total of 30 minutes during this encounter with the patient including review of chart and various tests results, discussions about plan of care and coordination of care plan.   Vincent Patten, MD  05/06/2024 11:14 AM  [facility] - A DEPT OF [facility] [id number] [place] [address] Dept: [phone] Dept Fax: [phone]    CHIEF COMPLAINT/ REASON FOR VISIT:   CLL, Rai stage II  Current Treatment: Active surveillance  INTERVAL HISTORY:    Discussed the use of AI scribe software for clinical note transcription with the patient, who gave verbal consent to proceed.  History of Present Illness  Vincent Wilkerson is a 68 year old male who presents with persistent night sweats and sleep disturbances.  He experiences night sweats nightly, which are somewhat managed by using a cooling sheet and an extra fan. Despite these measures, he believes the night sweats would be more severe without them. No fever, chills, or difficulty swallowing food.  He has significant sleep disturbances, characterized by difficulty staying asleep. He typically wakes up around 2:30 to 3:30 AM and feels fatigued by the end of the day, often feeling wiped out by the weekend. He has tried melatonin without success and is cautious about using over-the-counter sleep aids due to potential side effects.  His lymphocyte counts have been fluctuating over the past several months. In February, the count was 6,600, increased to 12,700 in July, and is currently 8,200.  He maintains a regular exercise routine, walking five miles a day with his dog. He consumes a Coke in the afternoon to help stay awake but avoids caffeine later in the day.   I have reviewed the past medical history, past surgical history, social history and family history with the patient and they are unchanged from previous note.  HISTORY OF  PRESENT ILLNESS:   ONCOLOGY HISTORY:   He was referred by his primary doctor for evaluation of high white blood cell count.   His elevated white blood cell count was first noted during a physical examination at the end of February. A subsequent interim blood test showed an increase in the white blood cell count from 14,000 to 17,000. His red and platelet counts remain normal.   He has experienced lymphadenopathy since around the time of his physical examination, which coincided with a cold. The lymph nodes have not reduced in size since then and were initially erythematous and irritated. He describes the swelling as bothersome but not currently severe.   He denies any recent changes in medication. He is a 'hot sleeper' and acknowledges experiencing more night sweats than initially admitted, requiring special cooling blankets for sleep. He notes a slight unintentional weight loss and a decrease in appetite, although his appetite is generally normal.   No fevers, chills, or significant weight loss, but confirms the presence of night sweats and a slight decrease in appetite.   White blood cell count elevated to 14,000 in February and increased to 17,000 on repeat testing in June 2025. 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    On his consultation with us  on 02/20/24, labs showed white count of 20,100 with absolute lymphocyte count of 12,700.  Hemoglobin 17.1, MCV 91, platelet count 185,000.  CMP unremarkable.  LDH increased to  237.  ESR and CRP were within normal limits.   On 02/20/2024 flow cytometry of peripheral blood showed abnormal clonal B-cell population comprising 60% of the lymphocytes.  The B cells were positive for CD5, CD19, CD20, CD38, CD200 and express lambda light chains.  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    Current symptoms include night sweats and decreased appetite.    Staging suspected to be Rai stage 1 based on  lymphadenopathy. CLL typically follows a mild course and often does not require treatment unless red flags are present, such as progressive anemia, thrombocytopenia, constitutional symptoms, significant symptomatic lymphadenopathy, or rapid lymphocyte count increase.   Given presence of cervical lymphadenopathy on physical exam, we proceeded with PET scan for staging and to evaluate for any bulky lymphadenopathy.  On 03/11/2024, staging PET scan showed extensive borderline enlarged lymph nodes seen throughout the neck, chest, abdomen and pelvis, largest lymph node measuring 3 x 1.8 cm.  Splenomegaly noted without evidence of FDG avidity.   - Monitor closely over the next few visits to determine if watchful waiting is appropriate.  Currently no constitutional symptoms or cytopenias that would warrant treatment initiation.    - Plan for mutation testing and possibly a bone marrow biopsy if treatment is needed.  Oncology History  CLL (chronic lymphocytic leukemia) (HCC)  02/20/2024 Initial Diagnosis   CLL (chronic lymphocytic leukemia) (HCC)   02/27/2024 Cancer Staging   Staging form: Chronic Lymphocytic Leukemia / Small Lymphocytic Lymphoma, AJCC 8th Edition - Clinical stage from 02/27/2024: Modified Rai Stage II (Modified Rai risk: Intermediate, Lymphocytosis: Present, Adenopathy: Present, Organomegaly: Present, Anemia: Absent, Thrombocytopenia: Absent) - Signed by Autumn Millman, MD on 03/29/2024 Stage prefix: Initial diagnosis       REVIEW OF SYSTEMS:   Review of Systems - Oncology  All other pertinent systems were reviewed with the patient and are negative.  ALLERGIES: He has no allergies on file.  MEDICATIONS:  Current Outpatient Medications  Medication Sig Dispense Refill   Armodafinil  200 MG TABS Take 1 tablet (200 mg total) by mouth daily. (Patient taking differently: Take 250 mg by mouth daily.) 90 tablet 1   hydrochlorothiazide  (MICROZIDE ) 12.5 MG capsule TAKE 1 CAPSULE BY MOUTH  DAILY 90 capsule 3   Multiple Vitamin (MULTIVITAMIN) tablet Take 1 tablet by mouth daily. Nature Made for men-Take one daily     rosuvastatin  (CRESTOR ) 40 MG tablet Take 1 tablet (40 mg total) by mouth daily. 90 tablet 3   No current facility-administered medications for this visit.     VITALS:   Blood pressure 139/71, pulse 73, temperature 98.1 F (36.7 C), temperature source Oral, resp. rate 16, height 5' 10 (1.778 m), weight 219 lb 9.6 oz (99.6 kg), SpO2 99%.  Wt Readings from Last 3 Encounters:  05/06/24 219 lb 9.6 oz (99.6 kg)  03/25/24 212 lb 14.4 oz (96.6 kg)  02/20/24 216 lb 8 oz (98.2 kg)    Body mass index is 31.51 kg/m.    Onc Performance Status - 05/06/24 0851       ECOG Perf Status   ECOG Perf Status Restricted in physically strenuous activity but ambulatory and able to carry out work of a light or sedentary nature, e.g., light house work, office work      KPS SCALE   KPS % SCORE Able to carry on normal activity, minor s/s of disease           PHYSICAL EXAM:   Physical Exam Constitutional:  General: He is not in acute distress.    Appearance: Normal appearance.  HENT:     Head: Normocephalic and atraumatic.  Eyes:     Conjunctiva/sclera: Conjunctivae normal.  Cardiovascular:     Rate and Rhythm: Normal rate and regular rhythm.  Pulmonary:     Effort: Pulmonary effort is normal. No respiratory distress.  Abdominal:     General: There is no distension.  Lymphadenopathy:     Cervical: Cervical adenopathy ((bilateral multiple, soft to firm LNs in multiple stations) present.  Neurological:     General: No focal deficit present.     Mental Status: He is alert and oriented to person, place, and time.  Psychiatric:        Mood and Affect: Mood normal.        Behavior: Behavior normal.       LABORATORY DATA:   I have reviewed the data as listed.  Results for orders placed or performed in visit on 05/06/24  Lactate dehydrogenase  Result  Value Ref Range   LDH 234 (H) 98 - 192 U/L  CMP (Cancer Center only)  Result Value Ref Range   Sodium 142 135 - 145 mmol/L   Potassium 4.3 3.5 - 5.1 mmol/L   Chloride 106 98 - 111 mmol/L   CO2 26 22 - 32 mmol/L   Glucose, Bld 85 70 - 99 mg/dL   BUN 20 8 - 23 mg/dL   Creatinine 9.05 9.38 - 1.24 mg/dL   Calcium  10.2 8.9 - 10.3 mg/dL   Total Protein 6.4 (L) 6.5 - 8.1 g/dL   Albumin 4.7 3.5 - 5.0 g/dL   AST 27 15 - 41 U/L   ALT 23 0 - 44 U/L   Alkaline Phosphatase 101 38 - 126 U/L   Total Bilirubin 0.4 0.0 - 1.2 mg/dL   GFR, Estimated >39 >39 mL/min   Anion gap 9 5 - 15  CBC with Differential (Cancer Center Only)  Result Value Ref Range   WBC Count 16.7 (H) 4.0 - 10.5 K/uL   RBC 4.99 4.22 - 5.81 MIL/uL   Hemoglobin 16.2 13.0 - 17.0 g/dL   HCT 53.6 60.9 - 47.9 %   MCV 92.8 80.0 - 100.0 fL   MCH 32.5 26.0 - 34.0 pg   MCHC 35.0 30.0 - 36.0 g/dL   RDW 87.1 88.4 - 84.4 %   Platelet Count 175 150 - 400 K/uL   nRBC 0.0 0.0 - 0.2 %   Neutrophils Relative % 41 %   Neutro Abs 6.8 1.7 - 7.7 K/uL   Lymphocytes Relative 48 %   Lymphs Abs 8.2 (H) 0.7 - 4.0 K/uL   Monocytes Relative 7 %   Monocytes Absolute 1.1 (H) 0.1 - 1.0 K/uL   Eosinophils Relative 2 %   Eosinophils Absolute 0.3 0.0 - 0.5 K/uL   Basophils Relative 1 %   Basophils Absolute 0.1 0.0 - 0.1 K/uL   Immature Granulocytes 1 %   Abs Immature Granulocytes 0.13 (H) 0.00 - 0.07 K/uL   Abnormal Lymphocytes Present PRESENT       RADIOGRAPHIC STUDIES:  I have personally reviewed the radiological images as listed and agree with the findings in the report.  NM PET Image Initial (PI) Skull Base To Thigh CLINICAL DATA:  Initial treatment strategy for CLL.  EXAM: NUCLEAR MEDICINE PET SKULL BASE TO THIGH  TECHNIQUE: 10.63 mCi F-18 FDG was injected intravenously. Full-ring PET imaging was performed from the skull base to thigh after the  radiotracer. CT data was obtained and used for attenuation correction and  anatomic localization.  Fasting blood glucose: 91 mg/dl  COMPARISON:  None Available.  FINDINGS: Mediastinal blood pool activity: SUV max 2.1  Liver activity: SUV max 2.7  NECK: There are numerous borderline and mildly enlarged lymph nodes throughout the neck including posterior triangle, sub mandibular and internal jugular regions. Many of these nodes are less than a cm in short axis but are much more numerous than usually seen. These have only low-level uptake. Example node left submandibular region has maximum SUV of 3.1 and dimension on CT image 42 1.6 by 2.1 cm. The example on the right side deep to the sternocleidomastoid muscle knee upper neck on image 33 of series 4 measures 10 by 13 mm and has maximum SUV of 1.8.  Near symmetric uptake of the visualized intracranial compartment.  Incidental CT findings: The thyroid  gland, submandibular glands and the parotid glands are grossly preserved. Rounded opacity along the inferior aspect of the left maxillary sinus is without abnormal uptake and could be a small mucous retention cyst. Is also some opacity along the left anterior ethmoid air cells without abnormal uptake. Otherwise the mastoid air cells are clear. There is some streak artifact related to the patient's dental hardware.  CHEST: Once again there are several areas of mild lymph node enlargement and uptake throughout the thorax. Most profound along the axillary regions greater than mediastinum and hila. Again many of these nodes are less than a cm in short axis but more prominent and numerous than usually seen. Example node on the right side has maximum SUV of 1.8 in this axillary node on image 64 measures 18 by 15 mm. Example node on the left axilla has maximum SUV of 2.2 and dimension on image 77 of 1.9 by 1.3 cm. Mediastinal nodes also do not show significant uptake. No areas of abnormal lung uptake.  Incidental CT findings: Heart is nonenlarged. Mild coronary  artery calcifications are seen. Please correlate for other coronary risk factors. There is some pulsation artifact identified. The thoracic aorta has a normal course and caliber. Normal caliber thoracic esophagus. Azygous fissure. Breathing motion. Calcified nodule lingula image 24 distal old granulomatous disease. Mild basilar atelectasis.  ABDOMEN/PELVIS: Spleen is enlarged with a launch in length of 14.7 cm. However no abnormal splenic uptake. In addition like the neck and chest there are several areas of nodal enlargement scattered throughout the abdomen pelvis including retroperitoneum, mesentery, iliac chain, pelvic sidewall and inguinal regions. And like other areas, again several are cm size and are less in short axis but more numerous than usually seen. Few more larger nodes are identified as well. Example includes portacaval node which has maximum SUV of 2.5. This node on image 118 of the CT measures 2.5 x 1.9 cm. Left periaortic node in the low retroperitoneum on image 156 measures 2.6 x 1.3 cm. This node has maximum SUV of 2.0. Large nodes are seen in the pelvis. Example right external iliac chain has maximum SUV of 1.9 but node measures 3.3 by 1.8 cm on series 4, image 184. Left-sided node on image 192 as an example measures 3.0 by 1.8 cm and has maximum SUV of 1.6.  There is physiologic distribution radiotracer otherwise along the parenchymal organs, bowel and renal collecting systems.  Incidental CT findings: Grossly, the adrenal glands and pancreas are unremarkable. Gallbladder is present. Tiny low-attenuation segment 4 liver lesion identified image 105 of series 4, too small to completely characterize  but again has no abnormal uptake and could be a benign cystic lesion. No renal or ureteral stones identified. Preserved contour to the urinary bladder. Enlarged prostate identified but no abnormal uptake at this time. Please correlate with history 2.5. There is 1 node  measured in the neck at maximum SUV value of. Diffuse colonic stool. Few sigmoid colon diverticula. Normal appendix with some high attenuation debris. Stomach and small bowel are nondilated. Mild scattered vascular calcifications. Normal caliber aorta and IVC.  SKELETON: No focal hypermetabolic activity to suggest skeletal metastasis.  Incidental CT findings: Diffuse scattered degenerative changes identified. Mild curvature of the spine.  IMPRESSION: Extensive borderline enlarged lymph nodes seen throughout the neck, chest, abdomen and pelvis. Level uptake however is relatively low with majority areas have thing maximum SUV value of less than 2.5 maximum SUV except for 1 node in low left neck having more intense uptake with maximum SUV value of 3.1. With this 1 node of more intense uptake Deauville category 4. Most areas are Deauville category 2 and 3.  Spleen is mildly enlarged but no abnormal splenic uptake.  Coronary artery calcifications. Please correlate for other coronary risk factors.  Enlarged prostate.  Colonic diverticula.  Electronically Signed   By: Ranell Bring M.D.   On: 03/11/2024 16:52    Future Appointments  Date Time Provider Department Center  07/02/2024  8:15 AM [facility] PHLEBOTOMIST [facility] None  07/02/2024  8:45 AM Kellon Chalk, [place], MD [facility] None  10/14/2024  8:20 AM Vincent Garnette KIDD, MD [facility]  10/31/2024  9:30 AM Dohmeier, Dedra, MD [facility] None     This document was completed utilizing speech recognition software. Grammatical errors, random word insertions, pronoun errors, and incomplete sentences are an occasional consequence of this system due to software limitations, ambient noise, and hardware issues. Any formal questions or concerns about the content, text or information contained within the body of this dictation should be directly addressed to the provider for clarification.

## 2024-05-06 NOTE — Assessment & Plan Note (Addendum)
 White blood cell count elevated to 14,000 in February and increased to 17,000 on repeat testing in June 2025. 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    On his consultation with us  on 02/20/24, labs showed white count of 20,100 with absolute lymphocyte count of 12,700.  Hemoglobin 17.1, MCV 91, platelet count 185,000.  CMP unremarkable.  LDH increased to 237.  ESR and CRP were within normal limits.  On 02/20/2024 flow cytometry of peripheral blood showed abnormal clonal B-cell population comprising 60% of the lymphocytes.  The B cells were positive for CD5, CD19, CD20, CD38, CD200 and express lambda light chains.  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   Previously I discussed results with the patient in detail.  Explained diagnosis, prognosis, plan of care, treatment options.  Reviewed NCCN guidelines.  Current symptoms include intermittent night sweats.   CLL typically follows a mild course and often does not require treatment unless red flags are present, such as progressive anemia, thrombocytopenia, constitutional symptoms, significant symptomatic lymphadenopathy, or rapid lymphocyte count increase.   On 03/11/2024, staging PET scan showed extensive borderline enlarged lymph nodes seen throughout the neck, chest, abdomen and pelvis, largest lymph node measuring 3 x 1.8 cm.  Splenomegaly noted without evidence of FDG avidity.  CLL Rai stage II.  Labs today showed overall stable white count at 16,700, ALC of 8200.  Hemoglobin normal at 16.2, platelet count normal at 175,000.  CMP unremarkable.  LDH 234, fluctuating.    - Monitor closely over the next few visits to determine if watchful waiting is appropriate.  Currently no constitutional symptoms or cytopenias that would warrant treatment initiation.    - Plan for mutation testing and possibly a bone marrow biopsy if treatment is needed.  - Schedule follow-up appointment in 2 months to assess  trend and determine if treatment is needed.

## 2024-07-02 ENCOUNTER — Inpatient Hospital Stay: Attending: Oncology

## 2024-07-02 ENCOUNTER — Inpatient Hospital Stay: Admitting: Oncology

## 2024-07-02 ENCOUNTER — Encounter: Payer: Self-pay | Admitting: Oncology

## 2024-07-02 VITALS — BP 148/85 | HR 81 | Temp 99.2°F | Resp 17 | Ht 70.0 in | Wt 219.2 lb

## 2024-07-02 DIAGNOSIS — G4730 Sleep apnea, unspecified: Secondary | ICD-10-CM | POA: Diagnosis not present

## 2024-07-02 DIAGNOSIS — N400 Enlarged prostate without lower urinary tract symptoms: Secondary | ICD-10-CM | POA: Diagnosis not present

## 2024-07-02 DIAGNOSIS — C9110 Chronic lymphocytic leukemia of B-cell type not having achieved remission: Secondary | ICD-10-CM

## 2024-07-02 DIAGNOSIS — I2510 Atherosclerotic heart disease of native coronary artery without angina pectoris: Secondary | ICD-10-CM | POA: Insufficient documentation

## 2024-07-02 DIAGNOSIS — E785 Hyperlipidemia, unspecified: Secondary | ICD-10-CM | POA: Diagnosis not present

## 2024-07-02 DIAGNOSIS — Z79899 Other long term (current) drug therapy: Secondary | ICD-10-CM | POA: Insufficient documentation

## 2024-07-02 DIAGNOSIS — I10 Essential (primary) hypertension: Secondary | ICD-10-CM | POA: Insufficient documentation

## 2024-07-02 LAB — CBC WITH DIFFERENTIAL (CANCER CENTER ONLY)
Abs Immature Granulocytes: 0.15 K/uL — ABNORMAL HIGH (ref 0.00–0.07)
Basophils Absolute: 0.2 K/uL — ABNORMAL HIGH (ref 0.0–0.1)
Basophils Relative: 1 %
Eosinophils Absolute: 0.3 K/uL (ref 0.0–0.5)
Eosinophils Relative: 1 %
HCT: 47.9 % (ref 39.0–52.0)
Hemoglobin: 17.4 g/dL — ABNORMAL HIGH (ref 13.0–17.0)
Immature Granulocytes: 1 %
Lymphocytes Relative: 55 %
Lymphs Abs: 11.8 K/uL — ABNORMAL HIGH (ref 0.7–4.0)
MCH: 33 pg (ref 26.0–34.0)
MCHC: 36.3 g/dL — ABNORMAL HIGH (ref 30.0–36.0)
MCV: 90.7 fL (ref 80.0–100.0)
Monocytes Absolute: 1.6 K/uL — ABNORMAL HIGH (ref 0.1–1.0)
Monocytes Relative: 8 %
Neutro Abs: 7.2 K/uL (ref 1.7–7.7)
Neutrophils Relative %: 34 %
Platelet Count: 168 K/uL (ref 150–400)
RBC: 5.28 MIL/uL (ref 4.22–5.81)
RDW: 13 % (ref 11.5–15.5)
WBC Count: 21.7 K/uL — ABNORMAL HIGH (ref 4.0–10.5)
nRBC: 0 % (ref 0.0–0.2)

## 2024-07-02 LAB — LACTATE DEHYDROGENASE: LDH: 232 U/L (ref 105–235)

## 2024-07-02 NOTE — Assessment & Plan Note (Addendum)
 White blood cell count elevated to 14,000 in February and increased to 17,000 on repeat testing in June 2025. 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    On his consultation with us  on 02/20/24, labs showed white count of 20,100 with absolute lymphocyte count of 12,700.  Hemoglobin 17.1, MCV 91, platelet count 185,000.  CMP unremarkable.  LDH increased to 237.  ESR and CRP were within normal limits.  On 02/20/2024 flow cytometry of peripheral blood showed abnormal clonal B-cell population comprising 60% of the lymphocytes.  The B cells were positive for CD5, CD19, CD20, CD38, CD200 and express lambda light chains.  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   Previously I discussed results with the patient in detail.  Explained diagnosis, prognosis, plan of care, treatment options.  Reviewed NCCN guidelines.  Current symptoms include intermittent night sweats.   CLL typically follows a mild course and often does not require treatment unless red flags are present, such as progressive anemia, thrombocytopenia, constitutional symptoms, significant symptomatic lymphadenopathy, or rapid lymphocyte count increase.   On 03/11/2024, staging PET scan showed extensive borderline enlarged lymph nodes seen throughout the neck, chest, abdomen and pelvis, largest lymph node measuring 3 x 1.8 cm.  Splenomegaly noted without evidence of FDG avidity.  CLL Rai stage II.  White blood cell count is elevated at 21,700, higher than previous counts. Lymphocyte count is 11,800, lower than July. Hemoglobin and platelet counts are normal. No significant changes warranting treatment initiation.   - Monitor closely over the next few visits to determine if watchful waiting is appropriate.  Currently no constitutional symptoms or cytopenias that would warrant treatment initiation.    - Plan for mutation testing and possibly a bone marrow biopsy if treatment is needed.  - Schedule  follow-up appointment in 3 months to assess trend and determine if treatment is needed. - Educated on symptoms of CLL progression, including severe fatigue, lack of appetite, weight loss, fevers, chills, night sweats

## 2024-07-02 NOTE — Progress Notes (Signed)
 [facility]  ONCOLOGY CLINIC PROGRESS NOTE   Patient Care Team: Katrinka Garnette KIDD, MD as PCP - General (Family Medicine)  PATIENT NAME: Vincent Wilkerson   MR#: [id number] DOB: February 22, 1956  Date of visit: 07/02/2024   ASSESSMENT & PLAN:   Vincent Wilkerson is a 68 y.o. gentleman with a past medical history of hypertension, dyslipidemia, sleep apnea, arthritis, depression, was referred to our service in July 2025 for evaluation of leukocytosis.  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  CLL (chronic lymphocytic leukemia) (HCC) White blood cell count elevated to 14,000 in February and increased to 17,000 on repeat testing in June 2025. 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    On his consultation with us  on 02/20/24, labs showed white count of 20,100 with absolute lymphocyte count of 12,700.  Hemoglobin 17.1, MCV 91, platelet count 185,000.  CMP unremarkable.  LDH increased to 237.  ESR and CRP were within normal limits.  On 02/20/2024 flow cytometry of peripheral blood showed abnormal clonal B-cell population comprising 60% of the lymphocytes.  The B cells were positive for CD5, CD19, CD20, CD38, CD200 and express lambda light chains.  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   Previously I discussed results with the patient in detail.  Explained diagnosis, prognosis, plan of care, treatment options.  Reviewed NCCN guidelines.  Current symptoms include intermittent night sweats.   CLL typically follows a mild course and often does not require treatment unless red flags are present, such as progressive anemia, thrombocytopenia, constitutional symptoms, significant symptomatic lymphadenopathy, or rapid lymphocyte count increase.   On 03/11/2024, staging PET scan showed extensive borderline enlarged lymph nodes seen throughout the neck, chest, abdomen and pelvis, largest lymph node measuring 3 x 1.8 cm.  Splenomegaly noted without  evidence of FDG avidity.  CLL Rai stage II.  Labs today showed overall stable white count at 16,700, ALC of 8200.  Hemoglobin normal at 16.2, platelet count normal at 175,000.  CMP unremarkable.  LDH 234, fluctuating.    - Monitor closely over the next few visits to determine if watchful waiting is appropriate.  Currently no constitutional symptoms or cytopenias that would warrant treatment initiation.    - Plan for mutation testing and possibly a bone marrow biopsy if treatment is needed.  - Schedule follow-up appointment in 2 months to assess trend and determine if treatment is needed.   Word-finding difficulty and mild cognitive symptoms Word-finding difficulty and mild cognitive symptoms, including trouble finding words and transposing word order, have been present for the last six weeks and have become more acute. No associated headaches or vision problems. Symptoms may be related to stress at work. - Continue to monitor cognitive symptoms and assess for any new neurological symptoms.  Shortness of breath on exertion Shortness of breath on exertion, particularly when climbing stairs, noticed more at the end of the day. No associated fevers, chills, or night sweats. Symptoms may be related to age and physical exertion. - Continue to monitor symptoms and assess for any new or worsening symptoms.  I reviewed lab results and outside records for this visit and discussed relevant results with the patient. Diagnosis, plan of care and treatment options were also discussed in detail with the patient. Opportunity provided to ask questions and answers provided to his apparent satisfaction. Provided instructions to call our clinic with any problems, questions or concerns prior to return visit. I recommended to continue follow-up with PCP and sub-specialists.  He verbalized understanding and agreed with the plan.   NCCN guidelines have been consulted in the planning of this patient's care.  I spent a  total of 30 minutes during this encounter with the patient including review of chart and various tests results, discussions about plan of care and coordination of care plan.   Chinita Patten, MD  07/02/2024 2:30 PM  [facility] - A DEPT OF [facility] [id number] [place] [address] Dept: [phone] Dept Fax: [phone]    CHIEF COMPLAINT/ REASON FOR VISIT:   CLL, Rai stage II  Current Treatment: Active surveillance  INTERVAL HISTORY:    Discussed the use of AI scribe software for clinical note transcription with the patient, who gave verbal consent to proceed.  History of Present Illness  Vincent Wilkerson is a 68 year old male with chronic lymphocytic leukemia (CLL) who presents for follow-up of his condition.  He experiences shortness of breath, particularly noticeable when walking up stairs, and more pronounced at the end of the day. This is a new symptom, as he did not experience it when he first moved into his house.  He has word-finding difficulties, which have become more noticeable over the past six weeks. He describes being 'afraid to talk' due to concerns about not finding the right words and sounding 'like an idiot.' He mentions transposing words and having trouble enunciating correctly, particularly when tired. He recalls an incident last week where he could not remember an old phone number, which required him to call his wife for assistance. No headaches or vision problems.  He mentions experiencing a low-grade fever according to a nurse, but he does not feel feverish at home. He reports eating well and maintaining his weight.  Regarding his CLL, his white blood cell count has fluctuated over the past few months, with a current count of 21,700, which is higher than previous counts. His lymphocyte count is slightly lower than it was in July. No significant changes in hemoglobin or platelet  counts, which remain normal.  He mentions a bump on his skin, previously thought to be an ingrown hair, which was treated with penicillin and hot compresses. It abscessed but left a hard lump, which he believes might still be an ingrown hair. It feels like scar tissue now.   I have reviewed the past medical history, past surgical history, social history and family history with the patient and they are unchanged from previous note.  HISTORY OF PRESENT ILLNESS:   ONCOLOGY HISTORY:   He was referred by his primary doctor for evaluation of high white blood cell count.   His elevated white blood cell count was first noted during a physical examination at the end of February. A subsequent interim blood test showed an increase in the white blood cell count from 14,000 to 17,000. His red and platelet counts remain normal.   He has experienced lymphadenopathy since around the time of his physical examination, which coincided with a cold. The lymph nodes have not reduced in size since then and were initially erythematous and irritated. He describes the swelling as bothersome but not currently severe.   He denies any recent changes in medication. He is a 'hot sleeper' and acknowledges experiencing more night sweats than initially admitted, requiring special cooling blankets for sleep. He notes a slight unintentional weight loss and a decrease in appetite, although his appetite is generally normal.   No fevers, chills, or  significant weight loss, but confirms the presence of night sweats and a slight decrease in appetite.   White blood cell count elevated to 14,000 in February and increased to 17,000 on repeat testing in June 2025. 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    On his consultation with us  on 02/20/24, labs showed white count of 20,100 with absolute lymphocyte count of 12,700.  Hemoglobin 17.1, MCV 91, platelet count 185,000.  CMP unremarkable.  LDH increased to 237.   ESR and CRP were within normal limits.   On 02/20/2024 flow cytometry of peripheral blood showed abnormal clonal B-cell population comprising 60% of the lymphocytes.  The B cells were positive for CD5, CD19, CD20, CD38, CD200 and express lambda light chains.  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    Current symptoms include night sweats and decreased appetite.    Staging suspected to be Rai stage 1 based on lymphadenopathy. CLL typically follows a mild course and often does not require treatment unless red flags are present, such as progressive anemia, thrombocytopenia, constitutional symptoms, significant symptomatic lymphadenopathy, or rapid lymphocyte count increase.   Given presence of cervical lymphadenopathy on physical exam, we proceeded with PET scan for staging and to evaluate for any bulky lymphadenopathy.  On 03/11/2024, staging PET scan showed extensive borderline enlarged lymph nodes seen throughout the neck, chest, abdomen and pelvis, largest lymph node measuring 3 x 1.8 cm.  Splenomegaly noted without evidence of FDG avidity.   - Monitor closely over the next few visits to determine if watchful waiting is appropriate.  Currently no constitutional symptoms or cytopenias that would warrant treatment initiation.    - Plan for mutation testing and possibly a bone marrow biopsy if treatment is needed.  Oncology History  CLL (chronic lymphocytic leukemia) (HCC)  02/20/2024 Initial Diagnosis   CLL (chronic lymphocytic leukemia) (HCC)   02/27/2024 Cancer Staging   Staging form: Chronic Lymphocytic Leukemia / Small Lymphocytic Lymphoma, AJCC 8th Edition - Clinical stage from 02/27/2024: Modified Rai Stage II (Modified Rai risk: Intermediate, Lymphocytosis: Present, Adenopathy: Present, Organomegaly: Present, Anemia: Absent, Thrombocytopenia: Absent) - Signed by Autumn Millman, MD on 03/29/2024 Stage prefix: Initial diagnosis       REVIEW OF SYSTEMS:   Review of  Systems - Oncology  All other pertinent systems were reviewed with the patient and are negative.  ALLERGIES: He has no allergies on file.  MEDICATIONS:  Current Outpatient Medications  Medication Sig Dispense Refill   Armodafinil  200 MG TABS Take 1 tablet (200 mg total) by mouth daily. (Patient taking differently: Take 250 mg by mouth daily.) 90 tablet 1   diphenhydrAMINE HCl, Sleep, (ZZZQUIL) 50 MG/30ML LIQD Take 12.5 mg by mouth at bedtime.     hydrochlorothiazide  (MICROZIDE ) 12.5 MG capsule TAKE 1 CAPSULE BY MOUTH DAILY 90 capsule 3   Melatonin 3 MG SUBL Place 1 tablet under the tongue at bedtime.     rosuvastatin  (CRESTOR ) 40 MG tablet Take 1 tablet (40 mg total) by mouth daily. 90 tablet 3   No current facility-administered medications for this visit.     VITALS:   Blood pressure (!) 148/85, pulse 81, temperature 99.2 F (37.3 C), temperature source Temporal, resp. rate 17, height 5' 10 (1.778 m), weight 219 lb 3.2 oz (99.4 kg), SpO2 99%.  Wt Readings from Last 3 Encounters:  07/02/24 219 lb 3.2 oz (99.4 kg)  05/06/24 219 lb 9.6 oz (99.6 kg)  03/25/24 212 lb 14.4 oz (96.6 kg)  Body mass index is 31.45 kg/m.    Onc Performance Status - 07/02/24 0842       ECOG Perf Status   ECOG Perf Status Restricted in physically strenuous activity but ambulatory and able to carry out work of a light or sedentary nature, e.g., light house work, office work      KPS SCALE   KPS % SCORE Normal activity with effort, some s/s of disease           PHYSICAL EXAM:   Physical Exam Constitutional:      General: He is not in acute distress.    Appearance: Normal appearance.  HENT:     Head: Normocephalic and atraumatic.  Eyes:     Conjunctiva/sclera: Conjunctivae normal.  Cardiovascular:     Rate and Rhythm: Normal rate and regular rhythm.  Pulmonary:     Effort: Pulmonary effort is normal. No respiratory distress.  Abdominal:     General: There is no distension.   Lymphadenopathy:     Cervical: Cervical adenopathy (bilateral multiple, soft to firm LNs in multiple stations) present.  Neurological:     General: No focal deficit present.     Mental Status: He is alert and oriented to person, place, and time.  Psychiatric:        Mood and Affect: Mood normal.        Behavior: Behavior normal.       LABORATORY DATA:   I have reviewed the data as listed.  Results for orders placed or performed in visit on 07/02/24  Lactate dehydrogenase  Result Value Ref Range   LDH 232 105 - 235 U/L  CBC with Differential (Cancer Center Only)  Result Value Ref Range   WBC Count 21.7 (H) 4.0 - 10.5 K/uL   RBC 5.28 4.22 - 5.81 MIL/uL   Hemoglobin 17.4 (H) 13.0 - 17.0 g/dL   HCT 52.0 60.9 - 47.9 %   MCV 90.7 80.0 - 100.0 fL   MCH 33.0 26.0 - 34.0 pg   MCHC 36.3 (H) 30.0 - 36.0 g/dL   RDW 86.9 88.4 - 84.4 %   Platelet Count 168 150 - 400 K/uL   nRBC 0.0 0.0 - 0.2 %   Neutrophils Relative % 34 %   Neutro Abs 7.2 1.7 - 7.7 K/uL   Lymphocytes Relative 55 %   Lymphs Abs 11.8 (H) 0.7 - 4.0 K/uL   Monocytes Relative 8 %   Monocytes Absolute 1.6 (H) 0.1 - 1.0 K/uL   Eosinophils Relative 1 %   Eosinophils Absolute 0.3 0.0 - 0.5 K/uL   Basophils Relative 1 %   Basophils Absolute 0.2 (H) 0.0 - 0.1 K/uL   Immature Granulocytes 1 %   Abs Immature Granulocytes 0.15 (H) 0.00 - 0.07 K/uL      RADIOGRAPHIC STUDIES:  I have personally reviewed the radiological images as listed and agree with the findings in the report.  NM PET Image Initial (PI) Skull Base To Thigh CLINICAL DATA:  Initial treatment strategy for CLL.  EXAM: NUCLEAR MEDICINE PET SKULL BASE TO THIGH  TECHNIQUE: 10.63 mCi F-18 FDG was injected intravenously. Full-ring PET imaging was performed from the skull base to thigh after the radiotracer. CT data was obtained and used for attenuation correction and anatomic localization.  Fasting blood glucose: 91 mg/dl  COMPARISON:  None  Available.  FINDINGS: Mediastinal blood pool activity: SUV max 2.1  Liver activity: SUV max 2.7  NECK: There are numerous borderline and mildly enlarged lymph nodes  throughout the neck including posterior triangle, sub mandibular and internal jugular regions. Many of these nodes are less than a cm in short axis but are much more numerous than usually seen. These have only low-level uptake. Example node left submandibular region has maximum SUV of 3.1 and dimension on CT image 42 1.6 by 2.1 cm. The example on the right side deep to the sternocleidomastoid muscle knee upper neck on image 33 of series 4 measures 10 by 13 mm and has maximum SUV of 1.8.  Near symmetric uptake of the visualized intracranial compartment.  Incidental CT findings: The thyroid  gland, submandibular glands and the parotid glands are grossly preserved. Rounded opacity along the inferior aspect of the left maxillary sinus is without abnormal uptake and could be a small mucous retention cyst. Is also some opacity along the left anterior ethmoid air cells without abnormal uptake. Otherwise the mastoid air cells are clear. There is some streak artifact related to the patient's dental hardware.  CHEST: Once again there are several areas of mild lymph node enlargement and uptake throughout the thorax. Most profound along the axillary regions greater than mediastinum and hila. Again many of these nodes are less than a cm in short axis but more prominent and numerous than usually seen. Example node on the right side has maximum SUV of 1.8 in this axillary node on image 64 measures 18 by 15 mm. Example node on the left axilla has maximum SUV of 2.2 and dimension on image 77 of 1.9 by 1.3 cm. Mediastinal nodes also do not show significant uptake. No areas of abnormal lung uptake.  Incidental CT findings: Heart is nonenlarged. Mild coronary artery calcifications are seen. Please correlate for other coronary  risk factors. There is some pulsation artifact identified. The thoracic aorta has a normal course and caliber. Normal caliber thoracic esophagus. Azygous fissure. Breathing motion. Calcified nodule lingula image 55 distal old granulomatous disease. Mild basilar atelectasis.  ABDOMEN/PELVIS: Spleen is enlarged with a launch in length of 14.7 cm. However no abnormal splenic uptake. In addition like the neck and chest there are several areas of nodal enlargement scattered throughout the abdomen pelvis including retroperitoneum, mesentery, iliac chain, pelvic sidewall and inguinal regions. And like other areas, again several are cm size and are less in short axis but more numerous than usually seen. Few more larger nodes are identified as well. Example includes portacaval node which has maximum SUV of 2.5. This node on image 118 of the CT measures 2.5 x 1.9 cm. Left periaortic node in the low retroperitoneum on image 156 measures 2.6 x 1.3 cm. This node has maximum SUV of 2.0. Large nodes are seen in the pelvis. Example right external iliac chain has maximum SUV of 1.9 but node measures 3.3 by 1.8 cm on series 4, image 184. Left-sided node on image 192 as an example measures 3.0 by 1.8 cm and has maximum SUV of 1.6.  There is physiologic distribution radiotracer otherwise along the parenchymal organs, bowel and renal collecting systems.  Incidental CT findings: Grossly, the adrenal glands and pancreas are unremarkable. Gallbladder is present. Tiny low-attenuation segment 4 liver lesion identified image 105 of series 4, too small to completely characterize but again has no abnormal uptake and could be a benign cystic lesion. No renal or ureteral stones identified. Preserved contour to the urinary bladder. Enlarged prostate identified but no abnormal uptake at this time. Please correlate with history 2.5. There is 1 node measured in the neck at maximum SUV  value of. Diffuse colonic stool.  Few sigmoid colon diverticula. Normal appendix with some high attenuation debris. Stomach and small bowel are nondilated. Mild scattered vascular calcifications. Normal caliber aorta and IVC.  SKELETON: No focal hypermetabolic activity to suggest skeletal metastasis.  Incidental CT findings: Diffuse scattered degenerative changes identified. Mild curvature of the spine.  IMPRESSION: Extensive borderline enlarged lymph nodes seen throughout the neck, chest, abdomen and pelvis. Level uptake however is relatively low with majority areas have thing maximum SUV value of less than 2.5 maximum SUV except for 1 node in low left neck having more intense uptake with maximum SUV value of 3.1. With this 1 node of more intense uptake Deauville category 4. Most areas are Deauville category 2 and 3.  Spleen is mildly enlarged but no abnormal splenic uptake.  Coronary artery calcifications. Please correlate for other coronary risk factors.  Enlarged prostate.  Colonic diverticula.  Electronically Signed   By: Ranell Bring M.D.   On: 03/11/2024 16:52    Future Appointments  Date Time Provider Department Center  10/01/2024  8:00 AM [facility] PHLEBOTOMIST [facility] None  10/01/2024  8:30 AM Maximo Spratling, Chinita, MD [facility] None  10/14/2024  8:20 AM Katrinka Garnette KIDD, MD [facility]  10/31/2024  9:30 AM Dohmeier, Dedra, MD [facility] None     This document was completed utilizing speech recognition software. Grammatical errors, random word insertions, pronoun errors, and incomplete sentences are an occasional consequence of this system due to software limitations, ambient noise, and hardware issues. Any formal questions or concerns about the content, text or information contained within the body of this dictation should be directly addressed to the provider for clarification.

## 2024-07-29 ENCOUNTER — Encounter: Payer: Self-pay | Admitting: Neurology

## 2024-07-30 MED ORDER — ARMODAFINIL 200 MG PO TABS: 200.0000 mg | ORAL_TABLET | Freq: Every day | ORAL | 1 refills | Status: AC

## 2024-07-30 NOTE — Telephone Encounter (Signed)
 Pt last seen 04/2024, last filled 04/24/24, upcoming 10/2024. Refill is appropriate.

## 2024-09-11 ENCOUNTER — Encounter: Payer: Self-pay | Admitting: Family Medicine

## 2024-09-11 ENCOUNTER — Ambulatory Visit: Admitting: Family Medicine

## 2024-09-11 ENCOUNTER — Ambulatory Visit: Payer: Self-pay

## 2024-09-11 VITALS — BP 138/78 | HR 87 | Temp 98.2°F | Ht 70.0 in | Wt 217.6 lb

## 2024-09-11 DIAGNOSIS — C9110 Chronic lymphocytic leukemia of B-cell type not having achieved remission: Secondary | ICD-10-CM

## 2024-09-11 DIAGNOSIS — B9689 Other specified bacterial agents as the cause of diseases classified elsewhere: Secondary | ICD-10-CM

## 2024-09-11 DIAGNOSIS — R051 Acute cough: Secondary | ICD-10-CM

## 2024-09-11 DIAGNOSIS — J208 Acute bronchitis due to other specified organisms: Secondary | ICD-10-CM

## 2024-09-11 LAB — POC COVID19 BINAXNOW: SARS Coronavirus 2 Ag: NEGATIVE

## 2024-09-11 MED ORDER — AZITHROMYCIN 250 MG PO TABS: ORAL_TABLET | ORAL | 0 refills | Status: AC

## 2024-09-11 NOTE — Progress Notes (Signed)
 "  Subjective  CC:  Chief Complaint  Patient presents with   Cough    Cough/congestion for the past 2.5 weeks and it has never gotten any better   Same day acute visit; PCP not available. New pt to me. Chart reviewed.   HPI: SUBJECTIVE:  Vincent Wilkerson is a 69 y.o. male who complains of congestion, nasal blockage, post nasal drip, cough described as dry and paroxysmal and denies sinus, high fevers, SOB, chest pain or significant GI symptoms. Symptoms have been present for going on 3 weeks.SABRA He denies a history of anorexia, dizziness, vomiting and wheezing. He denies a history of asthma or COPD. Patient denies and does not smoke cigarettes.  He has history of sleep apnea on CPAP.  Has history of CLL.  Blood counts have been stable.  Assessment  1. Acute bacterial bronchitis   2. Acute cough   3. CLL (chronic lymphocytic leukemia) (HCC)      Plan  Discussion:  Treat for bacterial bronchitis due to prolonged course and worsening symptoms. Education regarding differences between viral and bacterial infections and treatment options are discussed.  Supportive care measures are recommended.  We discussed the use of mucolytic's, decongestants, antihistamines and antitussives as needed.  Tylenol or Advil are recommended if needed. CLL; monitor. Avoid pred for now but can add if not responding to zpak alone. Rec mucinex dm for sx control  Follow up: prn   Orders Placed This Encounter  Procedures   POC COVID-19   Meds ordered this encounter  Medications   azithromycin  (ZITHROMAX ) 250 MG tablet    Sig: Take 2 tabs today, then 1 tab daily for 4 days    Dispense:  1 each    Refill:  0      I reviewed the patients updated PMH, FH, and SocHx.  Social History: Patient  reports that he has never smoked. He has never used smokeless tobacco. He reports that he does not currently use alcohol. He reports that he does not use drugs.  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   OSA on CPAP 12/08/2021   History of melanoma 12/15/2020   Sleep apnea with hypersomnolence 04/24/2019   Right calf pain 06/07/2018   Essential hypertension 08/31/2015   Obese 04/15/2015   Right hip pain 01/19/2015   History of basal cell cancer 05/10/2010   Hyperlipidemia 05/11/2007   OSA (obstructive sleep apnea) 05/11/2007    Review of Systems: Cardiovascular: negative for chest pain Respiratory: negative for SOB or hemoptysis Gastrointestinal: negative for abdominal pain Genitourinary: negative for dysuria or gross hematuria Active Medications[1]  Objective  Vitals: BP 138/78   Pulse 87   Temp 98.2 F (36.8 C)   Ht 5' 10 (1.778 m)   Wt 217 lb 9.6 oz (98.7 kg)   SpO2 98%   BMI 31.22 kg/m  General: no acute distress , some dry cough Psych:  Alert and oriented, normal mood and affect HEENT:  Normocephalic, atraumatic, supple neck, mild lymphadenopathy, supple neck Cardiovascular:  RRR without murmur. no edema Respiratory:  Good breath sounds bilaterally, CTAB with normal respiratory effort with occasional rhonchi Skin:  Warm, Neurologic:   Mental status is normal.  Commons side effects, risks, benefits, and alternatives  for medications and treatment plan prescribed today were discussed, and the patient expressed understanding of the given instructions. Patient is instructed to call or message via MyChart if he/she has any questions or concerns regarding our treatment plan. No barriers to understanding were identified. We discussed Red Flag symptoms and signs in detail. Patient expressed understanding regarding what to do in case of urgent or emergency type symptoms.  Medication list was reconciled,  printed and provided to the patient in AVS. Patient instructions and summary information was reviewed with the patient as documented in the AVS. This note was prepared with assistance of Dragon voice recognition software. Occasional wrong-word or sound-a-like substitutions may have occurred due to the inherent limitations of voice recognition software      [1]  Current Meds  Medication Sig   Armodafinil  200 MG TABS Take 1 tablet (200 mg total) by mouth daily.   azithromycin  (ZITHROMAX ) 250 MG tablet Take 2 tabs today, then 1 tab daily for 4 days   diphenhydrAMINE HCl, Sleep, (ZZZQUIL) 50 MG/30ML LIQD Take 12.5 mg by mouth at bedtime.   hydrochlorothiazide  (MICROZIDE ) 12.5 MG capsule TAKE 1 CAPSULE BY MOUTH DAILY   Melatonin 3 MG SUBL Place 1 tablet under the tongue at bedtime.   rosuvastatin  (CRESTOR ) 40 MG tablet Take 1 tablet (40 mg total) by mouth daily.   "

## 2024-09-11 NOTE — Telephone Encounter (Signed)
 Pt has appt today

## 2024-09-11 NOTE — Telephone Encounter (Signed)
 FYI Only or Action Required?: FYI only for provider: appointment scheduled on 09/11/2024 at 3:30pm at PCP office with Dr Lavern Heck  .  Patient was last seen in primary care on 10/12/2023 by Katrinka Garnette KIDD, MD.  Called Nurse Triage reporting Cough.  Symptoms began several weeks ago.  Interventions attempted: OTC medications: daytime decongestant and Rest, hydration, or home remedies.  Symptoms are: gradually worsening.  Triage Disposition: See HCP Within 4 Hours (Or PCP Triage)  Patient/caregiver understands and will follow disposition?: Yes      Message from [facility] sent at 09/11/2024  2:01 PM EST  Reason for Triage: Short of breath, headache, runny nose   Reason for Disposition  [1] MILD difficulty breathing (e.g., minimal/no SOB at rest, SOB with walking, pulse < 100) AND [2] still present when not coughing  Answer Assessment - Initial Assessment Questions Patient called and advised that he has had head congestion, runny nose, with intermittent headaches At night and in the mornings he coughs the most Coughing periodically throughout the day--pain feels like up above top teeth Patient states he is coughing up brownish yellow mucous usually only in the mornings Shoveling snow yesterday patient got short of breath and had to rest & states it feels like he can't take a full deep breath He states he does sleep with a CPAP He states that he was trying to just do home care but now he is starting to get short of breath on exertion and can't quite get 100% better No known fevers Patient denies chest pain, previous cardiac/pulmonary history, nausea, vomiting, diarrhea  Patient has tried daytime decongestant  Patient states he can come in for an appointment and he is advised if anything changes he can call 911 at any point Patient is advised to call us  back if anything changes or with any further questions/concerns. Patient is advised that if anything worsens to go to the  Emergency Room. Patient verbalized understanding.  Protocols used: Cough - Acute Productive-A-AH

## 2024-10-01 ENCOUNTER — Inpatient Hospital Stay: Admitting: Oncology

## 2024-10-01 ENCOUNTER — Inpatient Hospital Stay

## 2024-10-14 ENCOUNTER — Encounter: Payer: 59 | Admitting: Family Medicine

## 2024-10-31 ENCOUNTER — Ambulatory Visit: Admitting: Neurology

## 2024-11-13 ENCOUNTER — Ambulatory Visit: Admitting: Neurology
